# Patient Record
Sex: Female | Born: 1990 | Race: Black or African American | Hispanic: No | Marital: Married | Smoking: Never smoker
Health system: Southern US, Community
[De-identification: ages and names within clinical notes are randomized; demographics above are authoritative.]

## PROBLEM LIST (undated history)

## (undated) DIAGNOSIS — Z6841 Body Mass Index (BMI) 40.0 and over, adult: Secondary | ICD-10-CM

## (undated) DIAGNOSIS — F419 Anxiety disorder, unspecified: Secondary | ICD-10-CM

## (undated) DIAGNOSIS — R519 Headache, unspecified: Secondary | ICD-10-CM

## (undated) DIAGNOSIS — K8020 Calculus of gallbladder without cholecystitis without obstruction: Secondary | ICD-10-CM

## (undated) DIAGNOSIS — K219 Gastro-esophageal reflux disease without esophagitis: Secondary | ICD-10-CM

## (undated) DIAGNOSIS — Z8619 Personal history of other infectious and parasitic diseases: Secondary | ICD-10-CM

## (undated) DIAGNOSIS — T7840XA Allergy, unspecified, initial encounter: Secondary | ICD-10-CM

## (undated) DIAGNOSIS — R51 Headache: Secondary | ICD-10-CM

## (undated) HISTORY — DX: Headache, unspecified: R51.9

## (undated) HISTORY — DX: Headache: R51

## (undated) HISTORY — DX: Calculus of gallbladder without cholecystitis without obstruction: K80.20

## (undated) HISTORY — PX: NO PAST SURGERIES: SHX2092

## (undated) HISTORY — DX: Gastro-esophageal reflux disease without esophagitis: K21.9

## (undated) HISTORY — DX: Anxiety disorder, unspecified: F41.9

## (undated) HISTORY — DX: Personal history of other infectious and parasitic diseases: Z86.19

## (undated) HISTORY — DX: Allergy, unspecified, initial encounter: T78.40XA

## (undated) HISTORY — DX: Body Mass Index (BMI) 40.0 and over, adult: Z684

---
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
## 2008-10-08 ENCOUNTER — Emergency Department: Payer: Self-pay | Admitting: Unknown Physician Specialty

## 2008-11-26 ENCOUNTER — Emergency Department: Payer: Self-pay | Admitting: Emergency Medicine

## 2009-10-13 ENCOUNTER — Emergency Department: Payer: Self-pay | Admitting: Emergency Medicine

## 2011-12-24 ENCOUNTER — Emergency Department: Payer: Self-pay | Admitting: Emergency Medicine

## 2012-12-25 ENCOUNTER — Emergency Department: Payer: Self-pay | Admitting: Emergency Medicine

## 2013-02-02 ENCOUNTER — Emergency Department: Payer: Self-pay | Admitting: Emergency Medicine

## 2013-02-20 ENCOUNTER — Emergency Department: Payer: Self-pay | Admitting: Emergency Medicine

## 2014-01-20 DIAGNOSIS — K219 Gastro-esophageal reflux disease without esophagitis: Secondary | ICD-10-CM | POA: Insufficient documentation

## 2014-01-20 HISTORY — DX: Gastro-esophageal reflux disease without esophagitis: K21.9

## 2014-02-28 DIAGNOSIS — Z8619 Personal history of other infectious and parasitic diseases: Secondary | ICD-10-CM | POA: Insufficient documentation

## 2015-03-29 ENCOUNTER — Emergency Department
Admission: EM | Admit: 2015-03-29 | Discharge: 2015-03-29 | Disposition: A | Discharge status: Home / Self Care | Payer: Self-pay | Attending: Emergency Medicine | Admitting: Emergency Medicine

## 2015-03-29 DIAGNOSIS — J029 Acute pharyngitis, unspecified: Secondary | ICD-10-CM | POA: Insufficient documentation

## 2015-03-29 LAB — POCT RAPID STREP A: Streptococcus, Group A Screen (Direct): NEGATIVE

## 2015-03-29 MED ORDER — ACETAMINOPHEN-CODEINE 120-12 MG/5ML PO SUSP: 10.0000 mL | Freq: Four times a day (QID) | ORAL | Status: AC | PRN

## 2015-03-29 MED ORDER — ACETAMINOPHEN-CODEINE #3 300-30 MG PO TABS: 1.0000 | ORAL_TABLET | ORAL | Status: DC | PRN

## 2015-03-29 NOTE — Discharge Instructions (Signed)

## 2015-03-29 NOTE — ED Notes (Signed)
Rapid strep negative.

## 2015-03-29 NOTE — ED Provider Notes (Signed)
[facility] Emergency Department Provider Note  ____________________________________________  Time seen: Approximately 10:13 AM  I have reviewed the triage vital signs and the nursing notes.   HISTORY  Chief Complaint Sore Throat    HPI Sherry Spence is a 24 y.o. female who presents to the emergency department for evaluation of sore throat. Pain started last night. 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 She denies other symptoms such as cough or fever. She does state that she has generalized body aches and swollen lymph nodes.   No past medical history on file.  There are no active problems to display for this patient.   No past surgical history on file.  Current Outpatient Rx  Name  Route  Sig  Dispense  Refill  . acetaminophen-codeine (TYLENOL #3) 300-30 MG tablet   Oral   Take 1-2 tablets by mouth every 4 (four) hours as needed for moderate pain.   12 tablet   0     Allergies Review of patient's allergies indicates not on file.  No family history on file.  Social History Social History  Substance Use Topics  . Smoking status: Not on file  . Smokeless tobacco: Not on file  . Alcohol Use: Not on file    Review of Systems Constitutional: Negative for fever. Eyes: No visual changes. ENT: Positive for sore throat; positive for difficulty swallowing--due to pain. Respiratory: Denies shortness of breath. Gastrointestinal: No abdominal pain.  No nausea, no vomiting.  No diarrhea.  Genitourinary: Negative for dysuria. Musculoskeletal: Positive for generalized body aches. Skin: Negative for rash. Neurological: Negative for headaches, focal weakness or numbness.  10-point ROS otherwise negative.  ____________________________________________   PHYSICAL EXAM:  VITAL SIGNS: ED Triage Vitals  Enc Vitals Group     BP 03/29/15 0959 114/75 mmHg     Pulse Rate 03/29/15 0959 107     Resp 03/29/15 0959  15     Temp 03/29/15 0959 98.6 F (37 C)     Temp Source 03/29/15 0959 Oral     SpO2 03/29/15 0959 100 %     Weight 03/29/15 0959 230 lb (104.327 kg)     Height 03/29/15 0959 5\' 7"  (1.702 m)     Head Cir --      Peak Flow --      Pain Score --      Pain Loc --      Pain Edu? --      Excl. in [place]? --     Constitutional: Alert and oriented. Well appearing and in no acute distress. Eyes: Conjunctivae are normal. PERRL. EOMI. Head: Atraumatic. Nose: No congestion/rhinnorhea. Mouth/Throat: Mucous membranes are moist.  Oropharynx erythematous, without exudate, uvula and tonsils mildly swollen--no uvular shift. Neck: No stridor.  Lymphatic: Lymphadenopathy: Anterior cervical lymphadenopathy noted. Cardiovascular: Normal rate, regular rhythm. Good peripheral circulation. Respiratory: Normal respiratory effort. Lungs CTAB. Gastrointestinal: Soft and nontender. Musculoskeletal: No lower extremity tenderness nor edema.   Neurologic:  Normal speech and language. No gross focal neurologic deficits are appreciated. Speech is normal. No gait instability. Skin:  Skin is warm, dry and intact. No rash noted Psychiatric: Mood and affect are normal. Speech and behavior are normal.  ____________________________________________   LABS (all labs ordered are listed, but only abnormal results are displayed)  Labs Reviewed  POCT RAPID STREP A   ____________________________________________  EKG   ____________________________________________  RADIOLOGY  Not indicated ____________________________________________   PROCEDURES  Procedure(s) performed: None  Critical Care performed: No  ____________________________________________   INITIAL IMPRESSION / ASSESSMENT AND PLAN / ED COURSE  Pertinent labs & imaging results that were available during my care of the patient were reviewed by me and considered in my medical decision making (see chart for details).  Patient was advised to  follow up with the primary care provider for symptoms that are not improving over the next 2-3 days. She was advised to return to the emergency department for symptoms that change or worsen if unable to schedule an appointment with the primary care provider or specialist. ____________________________________________   FINAL CLINICAL IMPRESSION(S) / ED DIAGNOSES  Final diagnoses:  Viral pharyngitis      Victorino Dike, FNP 03/29/15 1056  Delman Kitten, MD 03/29/15 1526

## 2015-03-29 NOTE — ED Notes (Signed)
Pt began to have sore throat yesterday and awakened during the night with swollen throat and lymph nodes. She also c/o body aches. Pt alert & oriented. NAD noted.

## 2015-03-29 NOTE — ED Notes (Signed)
Pt discharged home after verbalizing understanding of discharge instructions; nad noted. 

## 2015-03-30 ENCOUNTER — Encounter: Payer: Self-pay | Admitting: Emergency Medicine

## 2015-03-30 ENCOUNTER — Emergency Department
Admission: EM | Admit: 2015-03-30 | Discharge: 2015-03-30 | Disposition: A | Discharge status: Home / Self Care | Payer: Self-pay | Attending: Student | Admitting: Student

## 2015-03-30 DIAGNOSIS — J020 Streptococcal pharyngitis: Secondary | ICD-10-CM | POA: Insufficient documentation

## 2015-03-30 MED ORDER — MAGIC MOUTHWASH W/LIDOCAINE: 5.0000 mL | Freq: Four times a day (QID) | ORAL | Status: DC

## 2015-03-30 MED ORDER — AMOXICILLIN 400 MG/5ML PO SUSR: 875.0000 mg | Freq: Two times a day (BID) | ORAL | Status: DC

## 2015-03-30 MED ORDER — AMOXICILLIN 875 MG PO TABS: 875.0000 mg | ORAL_TABLET | Freq: Two times a day (BID) | ORAL | Status: DC

## 2015-03-30 NOTE — Discharge Instructions (Signed)

## 2015-03-30 NOTE — ED Provider Notes (Signed)
[facility] Emergency Department Provider Note  ____________________________________________  Time seen: Approximately 6:32 PM  I have reviewed the triage vital signs and the nursing notes.   HISTORY  Chief Complaint Sore Throat    HPI Sherry Spence is a 24 y.o. female who returns to the emergency department for complaint of sore throat. She was seen yesterday and then emergent department and diagnosed with pharyngitis. She returns today due to her worsening symptoms. She states the pain is sharp in her throat. She endorses pain with swallowing. She endorses a tactile fever. She states she is experiencing a sporadic mild cough.   History reviewed. No pertinent past medical history.  There are no active problems to display for this patient.   History reviewed. No pertinent past surgical history.  Current Outpatient Rx  Name  Route  Sig  Dispense  Refill  . acetaminophen-codeine 120-12 MG/5ML suspension   Oral   Take 10 mLs by mouth every 6 (six) hours as needed for pain.   120 mL   0   . amoxicillin (AMOXIL) 875 MG tablet   Oral   Take 1 tablet (875 mg total) by mouth 2 (two) times daily.   14 tablet   0   . magic mouthwash w/lidocaine SOLN   Oral   Take 5 mLs by mouth 4 (four) times daily.   240 mL   0     Dispense in a 1/1/1/1 ratio     Allergies Review of patient's allergies indicates no known allergies.  No family history on file.  Social History Social History  Substance Use Topics  . Smoking status: Never Smoker   . Smokeless tobacco: None  . Alcohol Use: No    Review of Systems Constitutional: No fever/chills Eyes: No visual changes. ENT: Endorses sore throat. Cardiovascular: Denies chest pain. Respiratory: Denies shortness of breath. Gastrointestinal: No abdominal pain.  No nausea, no vomiting.  No diarrhea.  No constipation. Genitourinary: Negative for dysuria. Musculoskeletal: Negative for back pain. Skin:  Negative for rash. Neurological: Negative for headaches, focal weakness or numbness.  10-point ROS otherwise negative.  ____________________________________________   PHYSICAL EXAM:  VITAL SIGNS: ED Triage Vitals  Enc Vitals Group     BP 03/30/15 1831 120/74 mmHg     Pulse Rate 03/30/15 1831 100     Resp 03/30/15 1831 20     Temp 03/30/15 1831 99.7 F (37.6 C)     Temp Source 03/30/15 1831 Oral     SpO2 03/30/15 1831 98 %     Weight 03/30/15 1831 230 lb (104.327 kg)     Height 03/30/15 1831 5\' 7"  (1.702 m)     Head Cir --      Peak Flow --      Pain Score 03/30/15 1832 10     Pain Loc --      Pain Edu? --      Excl. in [place]? --     Constitutional: Alert and oriented. Well appearing and in no acute distress. Eyes: Conjunctivae are normal. PERRL. EOMI. Head: Atraumatic. Nose: No congestion/rhinnorhea. Mouth/Throat: Mucous membranes are moist.  Oropharynx erythematous. Positive white exudates on tonsils. Tonsils are edematous and erythematous. Uvula is midline. Neck: No stridor.   Hematological/Lymphatic/Immunilogical: Diffuse, mobile, tender anterior cervical lymphadenopathy. Cardiovascular: Normal rate, regular rhythm. Grossly normal heart sounds.  Good peripheral circulation. Respiratory: Normal respiratory effort.  No retractions. Lungs CTAB. Gastrointestinal: Soft and nontender. No distention. No abdominal bruits. No CVA tenderness. Musculoskeletal: No  lower extremity tenderness nor edema.  No joint effusions. Neurologic:  Normal speech and language. No gross focal neurologic deficits are appreciated. No gait instability. Skin:  Skin is warm, dry and intact. No rash noted. Psychiatric: Mood and affect are normal. Speech and behavior are normal.  ____________________________________________   LABS (all labs ordered are listed, but only abnormal results are displayed)  Labs Reviewed - No data to  display ____________________________________________  EKG   ____________________________________________  RADIOLOGY   ____________________________________________   PROCEDURES  Procedure(s) performed: None  Critical Care performed: No  ____________________________________________   INITIAL IMPRESSION / ASSESSMENT AND PLAN / ED COURSE  Pertinent labs & imaging results that were available during my care of the patient were reviewed by me and considered in my medical decision making (see chart for details).  The patient is a 24 year old female who presents to the emergency department for the second day in a row complaining of sore throat. She states that she has been exposed to multiple sick contacts. She was diagnosed yesterday with viral pharyngitis. She states the pain has worsened today.Patient had a negative strep test yesterday. The patient does meet Cent[facility] criteria today for streptococcal pharyngitis with fever despite taking Tylenol, tender lymphadenopathy, absence of a cough, and exudates on tonsils. Due to the patient now meaning 4 out of 5 Centor criteria I will treat the patient for strep. I will not re-swallow the patient for strep or culture as it would not change my current treatment plan. I advised patient of findings and diagnosis and she verbalizes understanding of same. The patient will be placed on antibiotics and magic mouthwash for symptom control. The patient is to follow-up with her primary care for any continuation of symptoms.   ____________________________________________   FINAL CLINICAL IMPRESSION(S) / ED DIAGNOSES  Final diagnoses:  Strep throat      Darletta Moll, PA-C 03/30/15 1854  Joanne Gavel, MD 03/30/15 2232

## 2015-03-30 NOTE — ED Notes (Signed)
Pt refused prescription for amoxicillin pills, script was changed to liquid and then pt refused liquid and wanted a shot. Explained to patient that a shot was not a route the provider wanted to administer. Pt stated " Then you will get me the pills." Explained that the original script was destroyed and pt laughed and stated " You will get me what I want." This RN exited the room and notified the provider. The provider is now in room with patient.

## 2015-03-30 NOTE — ED Notes (Signed)
Sore throat since sat  Was seen yesterday for same. States worse toda

## 2015-11-30 ENCOUNTER — Emergency Department
Admission: EM | Admit: 2015-11-30 | Discharge: 2015-11-30 | Disposition: A | Discharge status: Home / Self Care | Payer: Self-pay | Attending: Emergency Medicine | Admitting: Emergency Medicine

## 2015-11-30 DIAGNOSIS — O26891 Other specified pregnancy related conditions, first trimester: Secondary | ICD-10-CM | POA: Insufficient documentation

## 2015-11-30 DIAGNOSIS — Y9301 Activity, walking, marching and hiking: Secondary | ICD-10-CM | POA: Insufficient documentation

## 2015-11-30 DIAGNOSIS — W19XXXA Unspecified fall, initial encounter: Secondary | ICD-10-CM

## 2015-11-30 DIAGNOSIS — Z792 Long term (current) use of antibiotics: Secondary | ICD-10-CM | POA: Insufficient documentation

## 2015-11-30 DIAGNOSIS — W108XXA Fall (on) (from) other stairs and steps, initial encounter: Secondary | ICD-10-CM | POA: Insufficient documentation

## 2015-11-30 DIAGNOSIS — Z3A11 11 weeks gestation of pregnancy: Secondary | ICD-10-CM | POA: Insufficient documentation

## 2015-11-30 DIAGNOSIS — T148 Other injury of unspecified body region: Secondary | ICD-10-CM | POA: Insufficient documentation

## 2015-11-30 DIAGNOSIS — Y929 Unspecified place or not applicable: Secondary | ICD-10-CM | POA: Insufficient documentation

## 2015-11-30 DIAGNOSIS — T148XXA Other injury of unspecified body region, initial encounter: Secondary | ICD-10-CM

## 2015-11-30 DIAGNOSIS — Y999 Unspecified external cause status: Secondary | ICD-10-CM | POA: Insufficient documentation

## 2015-11-30 NOTE — ED Provider Notes (Signed)
[facility] Emergency Department Provider Note  Time seen: 5:28 PM  I have reviewed the triage vital signs and the nursing notes.   HISTORY  Chief Complaint Fall    HPI Sherry Spence is a 25 y.o. female with no past medical history who is approximately [redacted] weeks pregnant who presents the emergency department after a fall. According to the patient she was walking down steps when she slipped falling onto her buttocks and sliding down 5-6 steps.Patient states mild buttocks pain and lower back pain. States being [redacted] weeks pregnant and she is concerned about her baby, denies vaginal bleeding, discharge and denies hitting her abdomen.      History reviewed. No pertinent past medical history.  There are no active problems to display for this patient.   History reviewed. No pertinent past surgical history.  Current Outpatient Rx  Name  Route  Sig  Dispense  Refill  . acetaminophen-codeine 120-12 MG/5ML suspension   Oral   Take 10 mLs by mouth every 6 (six) hours as needed for pain.   120 mL   0   . amoxicillin (AMOXIL) 400 MG/5ML suspension   Oral   Take 10.9 mLs (875 mg total) by mouth 2 (two) times daily.   218 mL   0   . magic mouthwash w/lidocaine SOLN   Oral   Take 5 mLs by mouth 4 (four) times daily.   240 mL   0     Dispense in a 1/1/1/1 ratio     Allergies Compazine  No family history on file.  Social History Social History  Substance Use Topics  . Smoking status: Never Smoker   . Smokeless tobacco: None  . Alcohol Use: No    Review of Systems Constitutional: Negative for fever. Cardiovascular: Negative for chest pain. Respiratory: Negative for shortness of breath. Gastrointestinal: Negative for abdominal pain Musculoskeletal:  mild buttock pain, mild lower back pain. skin: Negative for rash. Neurological: Negative for headache 10-point ROS otherwise  negative.  ____________________________________________   PHYSICAL EXAM:  VITAL SIGNS: ED Triage Vitals  Enc Vitals Group     BP 11/30/15 1607 125/68 mmHg     Pulse Rate 11/30/15 1607 76     Resp 11/30/15 1607 16     Temp 11/30/15 1607 97.7 F (36.5 C)     Temp Source 11/30/15 1607 Oral     SpO2 11/30/15 1607 98 %     Weight 11/30/15 1607 250 lb (113.399 kg)     Height 11/30/15 1607 5\' 7"  (1.702 m)     Head Cir --      Peak Flow --      Pain Score 11/30/15 1607 8     Pain Loc --      Pain Edu? --      Excl. in [place]? --     Constitutional: Alert and oriented. Well appearing and in no distress. Eyes: Normal exam ENT   Head: Normocephalic and atraumatic.   Mouth/Throat: Mucous membranes are moist. Cardiovascular: Normal rate, regular rhythm. No murmur Respiratory: Normal respiratory effort without tachypnea nor retractions. Breath sounds are clear  Gastrointestinal: Soft and nontender. No distention.   Musculoskeletal: Nontender with normal range of motion in all extremities. mild lumbar spine tenderness palpation, mild right buttock tenderness to palpation. No deformity or ecchymosis. Neurologic:  Normal speech and language. No gross focal neurologic deficits Skin:  Skin is warm, dry and intact.  Psychiatric: Mood and affect are normal.   ____________________________________________  INITIAL IMPRESSION / ASSESSMENT AND PLAN / ED COURSE  Pertinent labs & imaging results that were available during my care of the patient were reviewed by me and considered in my medical decision making (see chart for details).   THE PATIENT PRESENTS EMERGENCY DEPARTMENT AFTER A FALL DOWN STEPS. OVERALL THE PATIENT APPEARS VERY WELL, MILD LUMBAR TENDERNESS TO PALPATION. PATIENT STATES HER MAIN CONCERN WAS MAKING SURE HER BABY IS OKAY. BEDSIDE ULTRASOUND PERFORMED BY MYSELF SHOWS A FETAL HEART RATE OF 158 BPM. VERY NORMAL FETAL MOVEMENT IS A NORMAL AMOUNT OF AMNIOTIC FLUID. PATIENT IS  REASSURED AFTER SEEING THE ULTRASOUND. I DISCUSSED TYLENOL AS NEEDED FOR PAIN MANAGEMENT AN OB/GYN FOLLOW-UP. PATIENT AGREEABLE TO THIS PLAN. ____________________________________________   FINAL CLINICAL IMPRESSION(S) / ED DIAGNOSES   fall  Harvest Dark, MD 11/30/15 1732
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
## 2015-11-30 NOTE — ED Notes (Signed)
Pt reports being [redacted] weeks pregnant, but states that she did not fall onto stomach, just on her side.

## 2015-11-30 NOTE — Discharge Instructions (Signed)
What Do I Need to Know About Injuries During Pregnancy? Injuries can happen during pregnancy. Minor falls and accidents usually do not harm you or your baby. However, any injury should be reported to your doctor. WHAT CAN I DO TO PROTECT MYSELF FROM INJURIES?  Remove rugs and loose objects on the floor.  Wear comfortable shoes that have a good grip. Do not wear high-heeled shoes.  Always wear your seat belt. The lap belt should be below your belly. Always practice safe driving.  Do not ride on a motorcycle.  Do not participate in high-impact activities or sports.  Avoid:  Walking on wet or slippery floors.  Fires.  Starting fires.  Lifting heavy pots of boiling or hot liquids.  Fixing electrical problems.  Only take medicine as told by your doctor.  Know your blood type and the blood type of the baby's father.  Call your local emergency services (911 in the [place] if you are a victim of domestic violence or assault. For help and support, contact the [employer]. WHEN SHOULD I GET HELP RIGHT AWAY?  You fall on your belly or have any high-impact accident or injury.  You have been a victim of domestic violence or any kind of violence.  You have been in a car accident.  You have bleeding from your vagina.  Fluid is leaking from your vagina.  You start to have belly cramping (contractions) or pain.  You feel weak or pass out (faint).  You start to throw up (vomit) after an injury.  You have been burned.  You have a stiff neck or neck pain.  You get a headache or have vision problems after an injury.  You do not feel the baby move or the baby is not moving as much as normal.   This information is not intended to replace advice given to you by your health care provider. Make sure you discuss any questions you have with your health care provider.   Document Released: 06/11/2010 Document Revised: 05/30/2014 Document Reviewed:  02/13/2013 Elsevier Interactive Patient Education [employer].

## 2015-11-30 NOTE — ED Notes (Signed)
Pt states she slipped and fell on her buttock down 5-6 steps today, pt states she is [redacted]weeks pregnant, denies any abd pain or bleeding, just wants to be checked out.Marland Kitchen

## 2015-12-28 ENCOUNTER — Emergency Department: Payer: No Typology Code available for payment source

## 2015-12-28 ENCOUNTER — Emergency Department
Admission: EM | Admit: 2015-12-28 | Discharge: 2015-12-28 | Disposition: A | Discharge status: Home / Self Care | Payer: No Typology Code available for payment source | Attending: Emergency Medicine | Admitting: Emergency Medicine

## 2015-12-28 ENCOUNTER — Encounter: Payer: Self-pay | Admitting: Emergency Medicine

## 2015-12-28 DIAGNOSIS — Y999 Unspecified external cause status: Secondary | ICD-10-CM | POA: Diagnosis not present

## 2015-12-28 DIAGNOSIS — Z3A16 16 weeks gestation of pregnancy: Secondary | ICD-10-CM | POA: Insufficient documentation

## 2015-12-28 DIAGNOSIS — R1032 Left lower quadrant pain: Secondary | ICD-10-CM | POA: Diagnosis present

## 2015-12-28 DIAGNOSIS — O26899 Other specified pregnancy related conditions, unspecified trimester: Secondary | ICD-10-CM

## 2015-12-28 DIAGNOSIS — Y92410 Unspecified street and highway as the place of occurrence of the external cause: Secondary | ICD-10-CM | POA: Diagnosis not present

## 2015-12-28 DIAGNOSIS — O26892 Other specified pregnancy related conditions, second trimester: Secondary | ICD-10-CM | POA: Insufficient documentation

## 2015-12-28 DIAGNOSIS — R109 Unspecified abdominal pain: Secondary | ICD-10-CM

## 2015-12-28 DIAGNOSIS — Y9389 Activity, other specified: Secondary | ICD-10-CM | POA: Diagnosis not present

## 2015-12-28 DIAGNOSIS — K8020 Calculus of gallbladder without cholecystitis without obstruction: Secondary | ICD-10-CM | POA: Insufficient documentation

## 2015-12-28 LAB — OB RESULTS CONSOLE RPR: RPR: NONREACTIVE

## 2015-12-28 LAB — CBC WITH DIFFERENTIAL/PLATELET
Basophils Absolute: 0.1 10*3/uL (ref 0–0.1)
Basophils Relative: 1 %
Eosinophils Absolute: 0.2 10*3/uL (ref 0–0.7)
Eosinophils Relative: 3 %
HCT: 33.9 % — ABNORMAL LOW (ref 35.0–47.0)
Hemoglobin: 11.7 g/dL — ABNORMAL LOW (ref 12.0–16.0)
Lymphocytes Relative: 19 %
Lymphs Abs: 1.4 10*3/uL (ref 1.0–3.6)
MCH: 27.2 pg (ref 26.0–34.0)
MCHC: 34.4 g/dL (ref 32.0–36.0)
MCV: 79 fL — ABNORMAL LOW (ref 80.0–100.0)
Monocytes Absolute: 0.5 10*3/uL (ref 0.2–0.9)
Monocytes Relative: 7 %
Neutro Abs: 5.2 10*3/uL (ref 1.4–6.5)
Neutrophils Relative %: 70 %
Platelets: 319 10*3/uL (ref 150–440)
RBC: 4.29 MIL/uL (ref 3.80–5.20)
RDW: 14.6 % — ABNORMAL HIGH (ref 11.5–14.5)
WBC: 7.5 10*3/uL (ref 3.6–11.0)

## 2015-12-28 LAB — LIPASE, BLOOD: Lipase: 24 U/L (ref 11–51)

## 2015-12-28 LAB — OB RESULTS CONSOLE ABO/RH: RH Type: POSITIVE

## 2015-12-28 LAB — COMPREHENSIVE METABOLIC PANEL
ALT: 34 U/L (ref 14–54)
AST: 21 U/L (ref 15–41)
Albumin: 2.8 g/dL — ABNORMAL LOW (ref 3.5–5.0)
Alkaline Phosphatase: 114 U/L (ref 38–126)
Anion gap: 5 (ref 5–15)
BUN: 8 mg/dL (ref 6–20)
CO2: 22 mmol/L (ref 22–32)
Calcium: 8.2 mg/dL — ABNORMAL LOW (ref 8.9–10.3)
Chloride: 108 mmol/L (ref 101–111)
Creatinine, Ser: 0.39 mg/dL — ABNORMAL LOW (ref 0.44–1.00)
GFR calc Af Amer: >60 mL/min (ref >60)
GFR calc non Af Amer: >60 mL/min (ref >60)
Glucose, Bld: 100 mg/dL — ABNORMAL HIGH (ref 65–99)
Potassium: 3.5 mmol/L (ref 3.5–5.1)
Sodium: 135 mmol/L (ref 135–145)
Total Bilirubin: 0.4 mg/dL (ref 0.3–1.2)
Total Protein: 6.7 g/dL (ref 6.5–8.1)

## 2015-12-28 LAB — ABO/RH: ABO/RH(D): O POS

## 2015-12-28 LAB — OB RESULTS CONSOLE GC/CHLAMYDIA
Chlamydia: NEGATIVE
Gonorrhea: NEGATIVE

## 2015-12-28 LAB — OB RESULTS CONSOLE HGB/HCT, BLOOD
HCT: 34 %
Hemoglobin: 11.7 g/dL

## 2015-12-28 LAB — OB RESULTS CONSOLE PLATELET COUNT: Platelets: 319 10*3/uL

## 2015-12-28 LAB — OB RESULTS CONSOLE ANTIBODY SCREEN: Antibody Screen: NEGATIVE

## 2015-12-28 LAB — OB RESULTS CONSOLE HIV ANTIBODY (ROUTINE TESTING): HIV: NONREACTIVE

## 2015-12-28 LAB — OB RESULTS CONSOLE HEPATITIS B SURFACE ANTIGEN: Hepatitis B Surface Ag: NEGATIVE

## 2015-12-28 NOTE — ED Notes (Signed)
Pt taken to US

## 2015-12-28 NOTE — ED Notes (Signed)
Two attempted IVs by Ramond Dial.  Katie RN attempting third.
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
## 2016-01-07 ENCOUNTER — Emergency Department
Admission: EM | Admit: 2016-01-07 | Discharge: 2016-01-07 | Disposition: A | Discharge status: Home / Self Care | Payer: No Typology Code available for payment source | Attending: Emergency Medicine | Admitting: Emergency Medicine

## 2016-01-07 ENCOUNTER — Emergency Department: Payer: No Typology Code available for payment source

## 2016-01-07 DIAGNOSIS — R102 Pelvic and perineal pain unspecified side: Secondary | ICD-10-CM

## 2016-01-07 DIAGNOSIS — Y92410 Unspecified street and highway as the place of occurrence of the external cause: Secondary | ICD-10-CM | POA: Insufficient documentation

## 2016-01-07 DIAGNOSIS — Y939 Activity, unspecified: Secondary | ICD-10-CM | POA: Insufficient documentation

## 2016-01-07 DIAGNOSIS — R109 Unspecified abdominal pain: Secondary | ICD-10-CM

## 2016-01-07 DIAGNOSIS — O26892 Other specified pregnancy related conditions, second trimester: Secondary | ICD-10-CM | POA: Insufficient documentation

## 2016-01-07 DIAGNOSIS — R1032 Left lower quadrant pain: Secondary | ICD-10-CM

## 2016-01-07 DIAGNOSIS — Y999 Unspecified external cause status: Secondary | ICD-10-CM | POA: Diagnosis not present

## 2016-01-07 DIAGNOSIS — O26899 Other specified pregnancy related conditions, unspecified trimester: Secondary | ICD-10-CM

## 2016-01-07 DIAGNOSIS — Z3A16 16 weeks gestation of pregnancy: Secondary | ICD-10-CM | POA: Insufficient documentation

## 2016-01-07 LAB — URINALYSIS COMPLETE WITH MICROSCOPIC (ARMC ONLY)
Bacteria, UA: NONE SEEN
Bilirubin Urine: NEGATIVE
Glucose, UA: NEGATIVE mg/dL
Hgb urine dipstick: NEGATIVE
Ketones, ur: NEGATIVE mg/dL
Leukocytes, UA: NEGATIVE
Nitrite: NEGATIVE
Protein, ur: NEGATIVE mg/dL
Specific Gravity, Urine: 1.017 (ref 1.005–1.030)
pH: 6 (ref 5.0–8.0)

## 2016-01-07 LAB — COMPREHENSIVE METABOLIC PANEL
ALT: 33 U/L (ref 14–54)
AST: 21 U/L (ref 15–41)
Albumin: 3.1 g/dL — ABNORMAL LOW (ref 3.5–5.0)
Alkaline Phosphatase: 117 U/L (ref 38–126)
Anion gap: 7 (ref 5–15)
BUN: 6 mg/dL (ref 6–20)
CO2: 21 mmol/L — ABNORMAL LOW (ref 22–32)
Calcium: 8.6 mg/dL — ABNORMAL LOW (ref 8.9–10.3)
Chloride: 107 mmol/L (ref 101–111)
Creatinine, Ser: 0.45 mg/dL (ref 0.44–1.00)
GFR calc Af Amer: >60 mL/min (ref >60)
GFR calc non Af Amer: >60 mL/min (ref >60)
Glucose, Bld: 112 mg/dL — ABNORMAL HIGH (ref 65–99)
Potassium: 3.3 mmol/L — ABNORMAL LOW (ref 3.5–5.1)
Sodium: 135 mmol/L (ref 135–145)
Total Bilirubin: 0.5 mg/dL (ref 0.3–1.2)
Total Protein: 7.3 g/dL (ref 6.5–8.1)

## 2016-01-07 LAB — CBC
HCT: 35.4 % (ref 35.0–47.0)
Hemoglobin: 12.2 g/dL (ref 12.0–16.0)
MCH: 27.5 pg (ref 26.0–34.0)
MCHC: 34.4 g/dL (ref 32.0–36.0)
MCV: 79.7 fL — ABNORMAL LOW (ref 80.0–100.0)
Platelets: 327 10*3/uL (ref 150–440)
RBC: 4.44 MIL/uL (ref 3.80–5.20)
RDW: 14.7 % — ABNORMAL HIGH (ref 11.5–14.5)
WBC: 8.5 10*3/uL (ref 3.6–11.0)

## 2016-01-07 LAB — HCG, QUANTITATIVE, PREGNANCY: hCG, Beta Chain, Quant, S: 37005 m[IU]/mL — ABNORMAL HIGH (ref <5)

## 2016-01-07 LAB — CHLAMYDIA/NGC RT PCR (ARMC ONLY)
Chlamydia Tr: NOT DETECTED
N gonorrhoeae: NOT DETECTED

## 2016-01-07 LAB — LIPASE, BLOOD: Lipase: 28 U/L (ref 11–51)

## 2016-01-07 LAB — WET PREP, GENITAL
Clue Cells Wet Prep HPF POC: NONE SEEN
Sperm: NONE SEEN
Trich, Wet Prep: NONE SEEN
Yeast Wet Prep HPF POC: NONE SEEN
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
## 2016-03-18 ENCOUNTER — Encounter: Payer: Self-pay | Admitting: Obstetrics and Gynecology

## 2016-03-18 ENCOUNTER — Ambulatory Visit (INDEPENDENT_AMBULATORY_CARE_PROVIDER_SITE_OTHER): Payer: Medicaid Other | Admitting: Obstetrics and Gynecology

## 2016-03-18 VITALS — BP 113/78 | HR 91 | Wt 246.0 lb

## 2016-03-18 DIAGNOSIS — Z6841 Body Mass Index (BMI) 40.0 and over, adult: Secondary | ICD-10-CM | POA: Insufficient documentation

## 2016-03-18 DIAGNOSIS — O0932 Supervision of pregnancy with insufficient antenatal care, second trimester: Secondary | ICD-10-CM

## 2016-03-18 DIAGNOSIS — E669 Obesity, unspecified: Secondary | ICD-10-CM

## 2016-03-18 DIAGNOSIS — O219 Vomiting of pregnancy, unspecified: Secondary | ICD-10-CM

## 2016-03-18 DIAGNOSIS — O99212 Obesity complicating pregnancy, second trimester: Secondary | ICD-10-CM

## 2016-03-18 DIAGNOSIS — O0930 Supervision of pregnancy with insufficient antenatal care, unspecified trimester: Secondary | ICD-10-CM | POA: Insufficient documentation

## 2016-03-18 DIAGNOSIS — Z3490 Encounter for supervision of normal pregnancy, unspecified, unspecified trimester: Secondary | ICD-10-CM | POA: Insufficient documentation

## 2016-03-18 DIAGNOSIS — O2340 Unspecified infection of urinary tract in pregnancy, unspecified trimester: Secondary | ICD-10-CM | POA: Insufficient documentation

## 2016-03-18 DIAGNOSIS — O2342 Unspecified infection of urinary tract in pregnancy, second trimester: Secondary | ICD-10-CM

## 2016-03-18 DIAGNOSIS — O99210 Obesity complicating pregnancy, unspecified trimester: Secondary | ICD-10-CM | POA: Insufficient documentation

## 2016-03-18 DIAGNOSIS — Z23 Encounter for immunization: Secondary | ICD-10-CM | POA: Diagnosis not present

## 2016-03-18 DIAGNOSIS — Z3492 Encounter for supervision of normal pregnancy, unspecified, second trimester: Secondary | ICD-10-CM

## 2016-03-18 HISTORY — DX: Body Mass Index (BMI) 40.0 and over, adult: Z684

## 2016-03-18 MED ORDER — DOXYLAMINE-PYRIDOXINE 10-10 MG PO TBEC: DELAYED_RELEASE_TABLET | ORAL | 1 refills | Status: DC

## 2016-03-18 NOTE — Progress Notes (Signed)
New OB Note  03/18/2016   Clinic: [facility]  Chief Complaint: establish care  Transfer of Care Patient: Yes, [facility]  History of Present Illness: Sherry Spence is a 25 y.o. G2P1000 @ 26/3 weeks ([place] 1/30, based on Patient's last menstrual period was 09/15/2015.=15wk u/s), with the above CC. Preg complicated by has Supervision of normal pregnancy in second trimester; BMI 40.0-44.9, adult ([place]); Obesity in pregnancy; UTI in pregnancy; Nausea and vomiting during pregnancy; and Late prenatal care on her problem list.   Patient states she'd like to transfer care b/c she thinks she'll get better care with [place] vs [facility].  ROS: A 12-point review of systems was performed and negative, except as stated in the above HPI.  OBGYN History: As per HPI. OB History  Gravida Para Term Preterm AB Living  2 1 1         SAB TAB Ectopic Multiple Live Births          1    # Outcome Date GA Lbr Len/2nd Weight Sex Delivery Anes PTL Lv  2 Current           1 Term 03/05/14 [redacted]w[redacted]d  7 lb 5 oz (3.317 kg) F Vag-Spont  N      Complications: Meconium aspiration     Birth Comments: Baby- had surgery right after delivery "cyst blocking airway"       Past Medical History: Past Medical History:  Diagnosis Date  . Cholelithiasis     Past Surgical History: Past Surgical History:  Procedure Laterality Date  . NO PAST SURGERIES      Family History:  Family History  Problem Relation Age of Onset  . Hypertension Father   . Diabetes Paternal Aunt   . Cancer Maternal Grandmother   . Diabetes Maternal Grandmother   . Diabetes Paternal Grandmother   . Hypertension Paternal Grandmother   . Mental retardation Neg Hx     Social History:  Social History   Social History  . Marital status: Single    Spouse name: N/A  . Number of children: N/A  . Years of education: N/A   Occupational History  . Not on file.   Social History Main Topics  . Smoking status: Never Smoker  . Smokeless  tobacco: Never Used  . Alcohol use No  . Drug use: No  . Sexual activity: Yes    Birth control/ protection: None   Other Topics Concern  . Not on file   Social History Narrative  . No narrative on file    Allergy: Allergies  Allergen Reactions  . Compazine [Prochlorperazine Edisylate] Anxiety    Health Maintenance:  Mammogram Up to Date: not applicable  Current Outpatient Medications: PNV amox  Physical Exam:   BP 113/78   Pulse 91   Wt 246 lb (111.6 kg)   LMP 09/15/2015   BMI 39.71 kg/m  Body mass index is 39.71 kg/m. Fundal height: 28 FHTs: 150s  General appearance: Well nourished, well developed female in no acute distress.  Abdomen: gravid, obese, nttp  Laboratory: O pos/Rub unknown/rpr neg/hiv neg/hepB neg/tdap needed/hemoglobinopathy neg/TSH neg/A1c 5.2/cmp neg/GC-CT neg/UDS neg/PC ratio 144/quad screen at [facility] and results unknown/early 1hr in the 100s per patient  Imaging:  8/7 and 8/17 u/s at [facility] anatomy u/s negative  Assessment: pt doing well  Plan: 1. Encounter for supervision of normal pregnancy in second trimester, unspecified gravidity Routine care. D/w pt that we only deliver at [facility] and she states that she  is aware of that. tdap 28-30wks.  - RPR - CBC - HIV antibody (with reflex) - Glucose Tolerance, 1 HR (50g) w/o Fasting  2. BMI 40.0-44.9, adult ([facility]) Negative baseline pre-x labs  3. Obesity in pregnancy See above  4. Urinary tract infection in mother during second trimester of pregnancy TOC one month  5. Nausea of pregnancy Pt states previously on zofran but s/s persisted. She does have a h/o gallstones but no current infection s/s. Has never been on diclegis so samples given and medicaid PA done.   Problem list reviewed and updated.  Follow up in 2 weeks.  >50% of 25 min visit spent on counseling and coordination of care.     Sherry Romans MD Attending [facility] for [facility])

## 2016-03-18 NOTE — Addendum Note (Signed)
Addended by: Phill Myron on: 03/18/2016 10:35 AM   Modules accepted: Orders

## 2016-03-21 ENCOUNTER — Encounter: Payer: Self-pay | Admitting: *Deleted

## 2016-03-21 LAB — GLUCOSE, 1 HOUR GESTATIONAL: Glucose 1 Hr Prenatal, POC: 107 mg/dL

## 2016-03-21 MED ORDER — DOXYLAMINE-PYRIDOXINE 10-10 MG PO TBEC: DELAYED_RELEASE_TABLET | ORAL | 1 refills | Status: DC

## 2016-03-21 NOTE — Addendum Note (Signed)
Addended by: Ricka Burdock on: 03/21/2016 09:46 AM   Modules accepted: Orders

## 2016-03-22 LAB — RPR

## 2016-03-22 LAB — HIV ANTIBODY (ROUTINE TESTING W REFLEX): HIV 1&2 Ab, 4th Generation: NONREACTIVE

## 2016-03-23 LAB — CBC

## 2016-03-26 LAB — GLUCOSE TOLERANCE, 1 HOUR (50G) W/O FASTING: Glucose, 1 Hr, gestational: 116 mg/dL (ref <140)

## 2016-03-28 ENCOUNTER — Encounter: Payer: Self-pay | Admitting: *Deleted

## 2016-03-28 ENCOUNTER — Other Ambulatory Visit (INDEPENDENT_AMBULATORY_CARE_PROVIDER_SITE_OTHER): Payer: Medicaid Other | Admitting: *Deleted

## 2016-03-28 DIAGNOSIS — Z3492 Encounter for supervision of normal pregnancy, unspecified, second trimester: Secondary | ICD-10-CM | POA: Diagnosis not present

## 2016-03-29 LAB — GLUCOSE TOLERANCE, 1 HOUR (50G) W/O FASTING: Glucose, 1 Hr, gestational: 88 mg/dL (ref <140)

## 2016-03-29 LAB — CBC
HCT: 30.7 % — ABNORMAL LOW (ref 35.0–45.0)
Hemoglobin: 9.8 g/dL — ABNORMAL LOW (ref 11.7–15.5)
MCH: 26.5 pg — ABNORMAL LOW (ref 27.0–33.0)
MCHC: 31.9 g/dL — ABNORMAL LOW (ref 32.0–36.0)
MCV: 83 fL (ref 80.0–100.0)
MPV: 9.5 fL (ref 7.5–12.5)
Platelets: 440 10*3/uL — ABNORMAL HIGH (ref 140–400)
RBC: 3.7 MIL/uL — ABNORMAL LOW (ref 3.80–5.10)
RDW: 14.6 % (ref 11.0–15.0)
WBC: 7.9 10*3/uL (ref 3.8–10.8)

## 2016-04-05 ENCOUNTER — Encounter: Payer: Self-pay | Admitting: Family Medicine

## 2016-04-05 ENCOUNTER — Ambulatory Visit (INDEPENDENT_AMBULATORY_CARE_PROVIDER_SITE_OTHER): Payer: Medicaid Other | Admitting: Family Medicine

## 2016-04-05 ENCOUNTER — Encounter: Payer: Self-pay | Admitting: *Deleted

## 2016-04-05 VITALS — BP 120/80 | HR 98 | Wt 250.0 lb

## 2016-04-05 DIAGNOSIS — Z3483 Encounter for supervision of other normal pregnancy, third trimester: Secondary | ICD-10-CM

## 2016-04-05 DIAGNOSIS — Z3480 Encounter for supervision of other normal pregnancy, unspecified trimester: Secondary | ICD-10-CM

## 2016-04-05 DIAGNOSIS — O219 Vomiting of pregnancy, unspecified: Secondary | ICD-10-CM | POA: Diagnosis not present

## 2016-04-05 DIAGNOSIS — O2343 Unspecified infection of urinary tract in pregnancy, third trimester: Secondary | ICD-10-CM

## 2016-04-05 NOTE — Progress Notes (Signed)
   PRENATAL VISIT NOTE  Subjective:  Sherry Spence is a 25 y.o. G2P1001 at [redacted]w[redacted]d being seen today for ongoing prenatal care.  She is currently monitored for the following issues for this low-risk pregnancy and has Supervision of normal pregnancy, antepartum; BMI 40.0-44.9, adult ([place]); Obesity in pregnancy; UTI in pregnancy; Nausea and vomiting during pregnancy; and Late prenatal care on her problem list.  Patient reports no complaints.  Contractions: Irregular. Vag. Bleeding: None.  Movement: Present. Denies leaking of fluid.   The following portions of the patient's history were reviewed and updated as appropriate: allergies, current medications, past family history, past medical history, past social history, past surgical history and problem list. Problem list updated.  Objective:   Vitals:   04/05/16 1342  BP: 120/80  Pulse: 98  Weight: 250 lb (113.4 kg)    Fetal Status: Fetal Heart Rate (bpm): 136 Fundal Height: 31 cm Movement: Present     General:  Alert, oriented and cooperative. Patient is in no acute distress.  Skin: Skin is warm and dry. No rash noted.   Cardiovascular: Normal heart rate noted  Respiratory: Normal respiratory effort, no problems with respiration noted  Abdomen: Soft, gravid, appropriate for gestational age. Pain/Pressure: Present     Pelvic:  Cervical exam deferred        Extremities: Normal range of motion.  Edema: Trace  Mental Status: Normal mood and affect. Normal behavior. Normal judgment and thought content.   Assessment and Plan:  Pregnancy: G2P1001 at [redacted]w[redacted]d  1. Supervision of other normal pregnancy, antepartum Continue routine prenatal care.  2. Nausea and vomiting during pregnancy Diclegis made her too sleepy, so no Phenergan and she declines Zofran due to constipation. Advised ginger, keep something on her stomach, [place] [employer].  3. Urinary tract infection in mother during third trimester of pregnancy Repeat urine culture - Culture, OB  Urine  Preterm labor symptoms and general obstetric precautions including but not limited to vaginal bleeding, contractions, leaking of fluid and fetal movement were reviewed in detail with the patient. Please refer to After Visit Summary for other counseling recommendations.  Return in 2 weeks (on 04/19/2016).   Donnamae Jude, MD

## 2016-04-05 NOTE — Patient Instructions (Signed)
Third Trimester of Pregnancy The third trimester is from week 29 through week 40 (months 7 through 9). The third trimester is a time when the unborn baby (fetus) is growing rapidly. At the end of the ninth month, the fetus is about 20 inches in length and weighs 6-10 pounds. Body changes during your third trimester Your body goes through many changes during pregnancy. The changes vary from woman to woman. During the third trimester:  Your weight will continue to increase. You can expect to gain 25-35 pounds (11-16 kg) by the end of the pregnancy.  You may begin to get stretch marks on your hips, abdomen, and breasts.  You may urinate more often because the fetus is moving lower into your pelvis and pressing on your bladder.  You may develop or continue to have heartburn. This is caused by increased hormones that slow down muscles in the digestive tract.  You may develop or continue to have constipation because increased hormones slow digestion and cause the muscles that push waste through your intestines to relax.  You may develop hemorrhoids. These are swollen veins (varicose veins) in the rectum that can itch or be painful.  You may develop swollen, bulging veins (varicose veins) in your legs.  You may have increased body aches in the pelvis, back, or thighs. This is due to weight gain and increased hormones that are relaxing your joints.  You may have changes in your hair. These can include thickening of your hair, rapid growth, and changes in texture. Some women also have hair loss during or after pregnancy, or hair that feels dry or thin. Your hair will most likely return to normal after your baby is born.  Your breasts will continue to grow and they will continue to become tender. A yellow fluid (colostrum) may leak from your breasts. This is the first milk you are producing for your baby.  Your belly button may stick out.  You may notice more swelling in your hands, face, or  ankles.  You may have increased tingling or numbness in your hands, arms, and legs. The skin on your belly may also feel numb.  You may feel short of breath because of your expanding uterus.  You may have more problems sleeping. This can be caused by the size of your belly, increased need to urinate, and an increase in your body's metabolism.  You may notice the fetus "dropping," or moving lower in your abdomen.  You may have increased vaginal discharge.  Your cervix becomes thin and soft (effaced) near your due date. What to expect at prenatal visits You will have prenatal exams every 2 weeks until week 36. Then you will have weekly prenatal exams. During a routine prenatal visit:  You will be weighed to make sure you and the fetus are growing normally.  Your blood pressure will be taken.  Your abdomen will be measured to track your baby's growth.  The fetal heartbeat will be listened to.  Any test results from the previous visit will be discussed.  You may have a cervical check near your due date to see if you have effaced. At around 36 weeks, your health care provider will check your cervix. At the same time, your health care provider will also perform a test on the secretions of the vaginal tissue. This test is to determine if a type of bacteria, Group B streptococcus, is present. Your health care provider will explain this further. Your health care provider may ask you:    What your birth plan is.  How you are feeling.  If you are feeling the baby move.  If you have had any abnormal symptoms, such as leaking fluid, bleeding, severe headaches, or abdominal cramping.  If you are using any tobacco products, including cigarettes, chewing tobacco, and electronic cigarettes.  If you have any questions. Other tests or screenings that may be performed during your third trimester include:  Blood tests that check for low iron levels (anemia).  Fetal testing to check the health,  activity level, and growth of the fetus. Testing is done if you have certain medical conditions or if there are problems during the pregnancy.  Nonstress test (NST). This test checks the health of your baby to make sure there are no signs of problems, such as the baby not getting enough oxygen. During this test, a belt is placed around your belly. The baby is made to move, and its heart rate is monitored during movement. What is false labor? False labor is a condition in which you feel small, irregular tightenings of the muscles in the womb (contractions) that eventually go away. These are called Braxton Hicks contractions. Contractions may last for hours, days, or even weeks before true labor sets in. If contractions come at regular intervals, become more frequent, increase in intensity, or become painful, you should see your health care provider. What are the signs of labor?  Abdominal cramps.  Regular contractions that start at 10 minutes apart and become stronger and more frequent with time.  Contractions that start on the top of the uterus and spread down to the lower abdomen and back.  Increased pelvic pressure and dull back pain.  A watery or bloody mucus discharge that comes from the vagina.  Leaking of amniotic fluid. This is also known as your "water breaking." It could be a slow trickle or a gush. Let your doctor know if it has a color or strange odor. If you have any of these signs, call your health care provider right away, even if it is before your due date. Follow these instructions at home: Eating and drinking  Continue to eat regular, healthy meals.  Do not eat:  Raw meat or meat spreads.  Unpasteurized milk or cheese.  Unpasteurized juice.  Store-made salad.  Refrigerated smoked seafood.  Hot dogs or deli meat, unless they are piping hot.  More than 6 ounces of albacore tuna a week.  Shark, swordfish, king mackerel, or tile fish.  Store-made salads.  Raw  sprouts, such as mung bean or alfalfa sprouts.  Take prenatal vitamins as told by your health care provider.  Take 1000 mg of calcium daily as told by your health care provider.  If you develop constipation:  Take over-the-counter or prescription medicines.  Drink enough fluid to keep your urine clear or pale yellow.  Eat foods that are high in fiber, such as fresh fruits and vegetables, whole grains, and beans.  Limit foods that are high in fat and processed sugars, such as fried and sweet foods. Activity  Exercise only as directed by your health care provider. Healthy pregnant women should aim for 2 hours and 30 minutes of moderate exercise per week. If you experience any pain or discomfort while exercising, stop.  Avoid heavy lifting.  Do not exercise in extreme heat or humidity, or at high altitudes.  Wear low-heel, comfortable shoes.  Practice good posture.  Do not travel far distances unless it is absolutely necessary and only with the approval   of your health care provider.  Wear your seat belt at all times while in a car, on a bus, or on a plane.  Take frequent breaks and rest with your legs elevated if you have leg cramps or low back pain.  Do not use hot tubs, steam rooms, or saunas.  You may continue to have sex unless your health care provider tells you otherwise. Lifestyle  Do not use any products that contain nicotine or tobacco, such as cigarettes and e-cigarettes. If you need help quitting, ask your health care provider.  Do not drink alcohol.  Do not use any medicinal herbs or unprescribed drugs. These chemicals affect the formation and growth of the baby.  If you develop varicose veins:  Wear support pantyhose or compression stockings as told by your healthcare provider.  Elevate your feet for 15 minutes, 3-4 times a day.  Wear a supportive maternity bra to help with breast tenderness. General instructions  Take over-the-counter and prescription  medicines only as told by your health care provider. There are medicines that are either safe or unsafe to take during pregnancy.  Take warm sitz baths to soothe any pain or discomfort caused by hemorrhoids. Use hemorrhoid cream or witch hazel if your health care provider approves.  Avoid cat litter boxes and soil used by cats. These carry germs that can cause birth defects in the baby. If you have a cat, ask someone to clean the litter box for you.  To prepare for the arrival of your baby:  Take prenatal classes to understand, practice, and ask questions about the labor and delivery.  Make a trial run to the hospital.  Visit the hospital and tour the maternity area.  Arrange for maternity or paternity leave through employers.  Arrange for family and friends to take care of pets while you are in the hospital.  Purchase a rear-facing car seat and make sure you know how to install it in your car.  Pack your hospital bag.  Prepare the baby's nursery. Make sure to remove all pillows and stuffed animals from the baby's crib to prevent suffocation.  Visit your dentist if you have not gone during your pregnancy. Use a soft toothbrush to brush your teeth and be gentle when you floss.  Keep all prenatal follow-up visits as told by your health care provider. This is important. Contact a health care provider if:  You are unsure if you are in labor or if your water has broken.  You become dizzy.  You have mild pelvic cramps, pelvic pressure, or nagging pain in your abdominal area.  You have lower back pain.  You have persistent nausea, vomiting, or diarrhea.  You have an unusual or bad smelling vaginal discharge.  You have pain when you urinate. Get help right away if:  You have a fever.  You are leaking fluid from your vagina.  You have spotting or bleeding from your vagina.  You have severe abdominal pain or cramping.  You have rapid weight loss or weight gain.  You have  shortness of breath with chest pain.  You notice sudden or extreme swelling of your face, hands, ankles, feet, or legs.  Your baby makes fewer than 10 movements in 2 hours.  You have severe headaches that do not go away with medicine.  You have vision changes. Summary  The third trimester is from week 29 through week 40, months 7 through 9. The third trimester is a time when the unborn baby (fetus)   is growing rapidly.  During the third trimester, your discomfort may increase as you and your baby continue to gain weight. You may have abdominal, leg, and back pain, sleeping problems, and an increased need to urinate.  During the third trimester your breasts will keep growing and they will continue to become tender. A yellow fluid (colostrum) may leak from your breasts. This is the first milk you are producing for your baby.  False labor is a condition in which you feel small, irregular tightenings of the muscles in the womb (contractions) that eventually go away. These are called Braxton Hicks contractions. Contractions may last for hours, days, or even weeks before true labor sets in.  Signs of labor can include: abdominal cramps; regular contractions that start at 10 minutes apart and become stronger and more frequent with time; watery or bloody mucus discharge that comes from the vagina; increased pelvic pressure and dull back pain; and leaking of amniotic fluid. This information is not intended to replace advice given to you by your health care provider. Make sure you discuss any questions you have with your health care provider. Document Released: 05/03/2001 Document Revised: 10/15/2015 Document Reviewed: 07/10/2012 [employer] Interactive Patient Education  2017 [employer]   Breastfeeding Deciding to breastfeed is one of the best choices you can make for you and your baby. A change in hormones during pregnancy causes your breast tissue to grow and increases the number and size of your  milk ducts. These hormones also allow proteins, sugars, and fats from your blood supply to make breast milk in your milk-producing glands. Hormones prevent breast milk from being released before your baby is born as well as prompt milk flow after birth. Once breastfeeding has begun, thoughts of your baby, as well as his or her sucking or crying, can stimulate the release of milk from your milk-producing glands. Benefits of breastfeeding For Your Baby  Your first milk (colostrum) helps your baby's digestive system function better.  There are antibodies in your milk that help your baby fight off infections.  Your baby has a lower incidence of asthma, allergies, and sudden infant death syndrome.  The nutrients in breast milk are better for your baby than infant formulas and are designed uniquely for your baby's needs.  Breast milk improves your baby's brain development.  Your baby is less likely to develop other conditions, such as childhood obesity, asthma, or type 2 diabetes mellitus. For You  Breastfeeding helps to create a very special bond between you and your baby.  Breastfeeding is convenient. Breast milk is always available at the correct temperature and costs nothing.  Breastfeeding helps to burn calories and helps you lose the weight gained during pregnancy.  Breastfeeding makes your uterus contract to its prepregnancy size faster and slows bleeding (lochia) after you give birth.  Breastfeeding helps to lower your risk of developing type 2 diabetes mellitus, osteoporosis, and breast or ovarian cancer later in life. Signs that your baby is hungry Early Signs of Hunger  Increased alertness or activity.  Stretching.  Movement of the head from side to side.  Movement of the head and opening of the mouth when the corner of the mouth or cheek is stroked (rooting).  Increased sucking sounds, smacking lips, cooing, sighing, or squeaking.  Hand-to-mouth movements.  Increased  sucking of fingers or hands. Late Signs of Hunger  Fussing.  Intermittent crying. Extreme Signs of Hunger  Signs of extreme hunger will require calming and consoling before your baby will   be able to breastfeed successfully. Do not wait for the following signs of extreme hunger to occur before you initiate breastfeeding:  Restlessness.  A loud, strong cry.  Screaming. Breastfeeding basics  Breastfeeding Initiation  Find a comfortable place to sit or lie down, with your neck and back well supported.  Place a pillow or rolled up blanket under your baby to bring him or her to the level of your breast (if you are seated). Nursing pillows are specially designed to help support your arms and your baby while you breastfeed.  Make sure that your baby's abdomen is facing your abdomen.  Gently massage your breast. With your fingertips, massage from your chest wall toward your nipple in a circular motion. This encourages milk flow. You may need to continue this action during the feeding if your milk flows slowly.  Support your breast with 4 fingers underneath and your thumb above your nipple. Make sure your fingers are well away from your nipple and your baby's mouth.  Stroke your baby's lips gently with your finger or nipple.  When your baby's mouth is open wide enough, quickly bring your baby to your breast, placing your entire nipple and as much of the colored area around your nipple (areola) as possible into your baby's mouth.  More areola should be visible above your baby's upper lip than below the lower lip.  Your baby's tongue should be between his or her lower gum and your breast.  Ensure that your baby's mouth is correctly positioned around your nipple (latched). Your baby's lips should create a seal on your breast and be turned out (everted).  It is common for your baby to suck about 2-3 minutes in order to start the flow of breast milk. Latching  Teaching your baby how to latch  on to your breast properly is very important. An improper latch can cause nipple pain and decreased milk supply for you and poor weight gain in your baby. Also, if your baby is not latched onto your nipple properly, he or she may swallow some air during feeding. This can make your baby fussy. Burping your baby when you switch breasts during the feeding can help to get rid of the air. However, teaching your baby to latch on properly is still the best way to prevent fussiness from swallowing air while breastfeeding. Signs that your baby has successfully latched on to your nipple:  Silent tugging or silent sucking, without causing you pain.  Swallowing heard between every 3-4 sucks.  Muscle movement above and in front of his or her ears while sucking. Signs that your baby has not successfully latched on to nipple:  Sucking sounds or smacking sounds from your baby while breastfeeding.  Nipple pain. If you think your baby has not latched on correctly, slip your finger into the corner of your baby's mouth to break the suction and place it between your baby's gums. Attempt breastfeeding initiation again. Signs of Successful Breastfeeding  Signs from your baby:  A gradual decrease in the number of sucks or complete cessation of sucking.  Falling asleep.  Relaxation of his or her body.  Retention of a small amount of milk in his or her mouth.  Letting go of your breast by himself or herself. Signs from you:  Breasts that have increased in firmness, weight, and size 1-3 hours after feeding.  Breasts that are softer immediately after breastfeeding.  Increased milk volume, as well as a change in milk consistency and color by   the fifth day of breastfeeding.  Nipples that are not sore, cracked, or bleeding. Signs That Your Baby is Getting Enough Milk  Wetting at least 1-2 diapers during the first 24 hours after birth.  Wetting at least 5-6 diapers every 24 hours for the first week after  birth. The urine should be clear or pale yellow by 5 days after birth.  Wetting 6-8 diapers every 24 hours as your baby continues to grow and develop.  At least 3 stools in a 24-hour period by age 5 days. The stool should be soft and yellow.  At least 3 stools in a 24-hour period by age 7 days. The stool should be seedy and yellow.  No loss of weight greater than 10% of birth weight during the first 3 days of age.  Average weight gain of 4-7 ounces (113-198 g) per week after age 4 days.  Consistent daily weight gain by age 5 days, without weight loss after the age of 2 weeks. After a feeding, your baby may spit up a small amount. This is common. Breastfeeding frequency and duration Frequent feeding will help you make more milk and can prevent sore nipples and breast engorgement. Breastfeed when you feel the need to reduce the fullness of your breasts or when your baby shows signs of hunger. This is called "breastfeeding on demand." Avoid introducing a pacifier to your baby while you are working to establish breastfeeding (the first 4-6 weeks after your baby is born). After this time you may choose to use a pacifier. Research has shown that pacifier use during the first year of a baby's life decreases the risk of sudden infant death syndrome (SIDS). Allow your baby to feed on each breast as long as he or she wants. Breastfeed until your baby is finished feeding. When your baby unlatches or falls asleep while feeding from the first breast, offer the second breast. Because newborns are often sleepy in the first few weeks of life, you may need to awaken your baby to get him or her to feed. Breastfeeding times will vary from baby to baby. However, the following rules can serve as a guide to help you ensure that your baby is properly fed:  Newborns (babies 4 weeks of age or younger) may breastfeed every 1-3 hours.  Newborns should not go longer than 3 hours during the day or 5 hours during the night  without breastfeeding.  You should breastfeed your baby a minimum of 8 times in a 24-hour period until you begin to introduce solid foods to your baby at around 6 months of age. Breast milk pumping Pumping and storing breast milk allows you to ensure that your baby is exclusively fed your breast milk, even at times when you are unable to breastfeed. This is especially important if you are going back to work while you are still breastfeeding or when you are not able to be present during feedings. Your lactation consultant can give you guidelines on how long it is safe to store breast milk. A breast pump is a machine that allows you to pump milk from your breast into a sterile bottle. The pumped breast milk can then be stored in a refrigerator or freezer. Some breast pumps are operated by hand, while others use electricity. Ask your lactation consultant which type will work best for you. Breast pumps can be purchased, but some hospitals and breastfeeding support groups lease breast pumps on a monthly basis. A lactation consultant can teach you how to   hand express breast milk, if you prefer not to use a pump. Caring for your breasts while you breastfeed Nipples can become dry, cracked, and sore while breastfeeding. The following recommendations can help keep your breasts moisturized and healthy:  Avoid using soap on your nipples.  Wear a supportive bra. Although not required, special nursing bras and tank tops are designed to allow access to your breasts for breastfeeding without taking off your entire bra or top. Avoid wearing underwire-style bras or extremely tight bras.  Air dry your nipples for 3-4minutes after each feeding.  Use only cotton bra pads to absorb leaked breast milk. Leaking of breast milk between feedings is normal.  Use lanolin on your nipples after breastfeeding. Lanolin helps to maintain your skin's normal moisture barrier. If you use pure lanolin, you do not need to wash it off  before feeding your baby again. Pure lanolin is not toxic to your baby. You may also hand express a few drops of breast milk and gently massage that milk into your nipples and allow the milk to air dry. In the first few weeks after giving birth, some women experience extremely full breasts (engorgement). Engorgement can make your breasts feel heavy, warm, and tender to the touch. Engorgement peaks within 3-5 days after you give birth. The following recommendations can help ease engorgement:  Completely empty your breasts while breastfeeding or pumping. You may want to start by applying warm, moist heat (in the shower or with warm water-soaked hand towels) just before feeding or pumping. This increases circulation and helps the milk flow. If your baby does not completely empty your breasts while breastfeeding, pump any extra milk after he or she is finished.  Wear a snug bra (nursing or regular) or tank top for 1-2 days to signal your body to slightly decrease milk production.  Apply ice packs to your breasts, unless this is too uncomfortable for you.  Make sure that your baby is latched on and positioned properly while breastfeeding. If engorgement persists after 48 hours of following these recommendations, contact your health care provider or a lactation consultant. Overall health care recommendations while breastfeeding  Eat healthy foods. Alternate between meals and snacks, eating 3 of each per day. Because what you eat affects your breast milk, some of the foods may make your baby more irritable than usual. Avoid eating these foods if you are sure that they are negatively affecting your baby.  Drink milk, fruit juice, and water to satisfy your thirst (about 10 glasses a day).  Rest often, relax, and continue to take your prenatal vitamins to prevent fatigue, stress, and anemia.  Continue breast self-awareness checks.  Avoid chewing and smoking tobacco. Chemicals from cigarettes that pass  into breast milk and exposure to secondhand smoke may harm your baby.  Avoid alcohol and drug use, including marijuana. Some medicines that may be harmful to your baby can pass through breast milk. It is important to ask your health care provider before taking any medicine, including all over-the-counter and prescription medicine as well as vitamin and herbal supplements. It is possible to become pregnant while breastfeeding. If birth control is desired, ask your health care provider about options that will be safe for your baby. Contact a health care provider if:  You feel like you want to stop breastfeeding or have become frustrated with breastfeeding.  You have painful breasts or nipples.  Your nipples are cracked or bleeding.  Your breasts are red, tender, or warm.  You have   a swollen area on either breast.  You have a fever or chills.  You have nausea or vomiting.  You have drainage other than breast milk from your nipples.  Your breasts do not become full before feedings by the fifth day after you give birth.  You feel sad and depressed.  Your baby is too sleepy to eat well.  Your baby is having trouble sleeping.  Your baby is wetting less than 3 diapers in a 24-hour period.  Your baby has less than 3 stools in a 24-hour period.  Your baby's skin or the white part of his or her eyes becomes yellow.  Your baby is not gaining weight by 5 days of age. Get help right away if:  Your baby is overly tired (lethargic) and does not want to wake up and feed.  Your baby develops an unexplained fever. This information is not intended to replace advice given to you by your health care provider. Make sure you discuss any questions you have with your health care provider. Document Released: 05/09/2005 Document Revised: 10/21/2015 Document Reviewed: 10/31/2012 Elsevier Interactive Patient Education  2017 [employer]vier Inc.  

## 2016-04-07 LAB — CULTURE, OB URINE: Organism ID, Bacteria: NO GROWTH

## 2016-04-26 ENCOUNTER — Ambulatory Visit (INDEPENDENT_AMBULATORY_CARE_PROVIDER_SITE_OTHER): Payer: Medicaid Other | Admitting: Family Medicine

## 2016-04-26 DIAGNOSIS — Z3480 Encounter for supervision of other normal pregnancy, unspecified trimester: Secondary | ICD-10-CM

## 2016-04-26 DIAGNOSIS — Z3483 Encounter for supervision of other normal pregnancy, third trimester: Secondary | ICD-10-CM

## 2016-04-26 NOTE — Patient Instructions (Signed)
Third Trimester of Pregnancy The third trimester is from week 29 through week 40 (months 7 through 9). The third trimester is a time when the unborn baby (fetus) is growing rapidly. At the end of the ninth month, the fetus is about 20 inches in length and weighs 6-10 pounds. Body changes during your third trimester Your body goes through many changes during pregnancy. The changes vary from woman to woman. During the third trimester:  Your weight will continue to increase. You can expect to gain 25-35 pounds (11-16 kg) by the end of the pregnancy.  You may begin to get stretch marks on your hips, abdomen, and breasts.  You may urinate more often because the fetus is moving lower into your pelvis and pressing on your bladder.  You may develop or continue to have heartburn. This is caused by increased hormones that slow down muscles in the digestive tract.  You may develop or continue to have constipation because increased hormones slow digestion and cause the muscles that push waste through your intestines to relax.  You may develop hemorrhoids. These are swollen veins (varicose veins) in the rectum that can itch or be painful.  You may develop swollen, bulging veins (varicose veins) in your legs.  You may have increased body aches in the pelvis, back, or thighs. This is due to weight gain and increased hormones that are relaxing your joints.  You may have changes in your hair. These can include thickening of your hair, rapid growth, and changes in texture. Some women also have hair loss during or after pregnancy, or hair that feels dry or thin. Your hair will most likely return to normal after your baby is born.  Your breasts will continue to grow and they will continue to become tender. A yellow fluid (colostrum) may leak from your breasts. This is the first milk you are producing for your baby.  Your belly button may stick out.  You may notice more swelling in your hands, face, or  ankles.  You may have increased tingling or numbness in your hands, arms, and legs. The skin on your belly may also feel numb.  You may feel short of breath because of your expanding uterus.  You may have more problems sleeping. This can be caused by the size of your belly, increased need to urinate, and an increase in your body's metabolism.  You may notice the fetus "dropping," or moving lower in your abdomen.  You may have increased vaginal discharge.  Your cervix becomes thin and soft (effaced) near your due date. What to expect at prenatal visits You will have prenatal exams every 2 weeks until week 36. Then you will have weekly prenatal exams. During a routine prenatal visit:  You will be weighed to make sure you and the fetus are growing normally.  Your blood pressure will be taken.  Your abdomen will be measured to track your baby's growth.  The fetal heartbeat will be listened to.  Any test results from the previous visit will be discussed.  You may have a cervical check near your due date to see if you have effaced. At around 36 weeks, your health care provider will check your cervix. At the same time, your health care provider will also perform a test on the secretions of the vaginal tissue. This test is to determine if a type of bacteria, Group B streptococcus, is present. Your health care provider will explain this further. Your health care provider may ask you:    What your birth plan is.  How you are feeling.  If you are feeling the baby move.  If you have had any abnormal symptoms, such as leaking fluid, bleeding, severe headaches, or abdominal cramping.  If you are using any tobacco products, including cigarettes, chewing tobacco, and electronic cigarettes.  If you have any questions. Other tests or screenings that may be performed during your third trimester include:  Blood tests that check for low iron levels (anemia).  Fetal testing to check the health,  activity level, and growth of the fetus. Testing is done if you have certain medical conditions or if there are problems during the pregnancy.  Nonstress test (NST). This test checks the health of your baby to make sure there are no signs of problems, such as the baby not getting enough oxygen. During this test, a belt is placed around your belly. The baby is made to move, and its heart rate is monitored during movement. What is false labor? False labor is a condition in which you feel small, irregular tightenings of the muscles in the womb (contractions) that eventually go away. These are called Braxton Hicks contractions. Contractions may last for hours, days, or even weeks before true labor sets in. If contractions come at regular intervals, become more frequent, increase in intensity, or become painful, you should see your health care provider. What are the signs of labor?  Abdominal cramps.  Regular contractions that start at 10 minutes apart and become stronger and more frequent with time.  Contractions that start on the top of the uterus and spread down to the lower abdomen and back.  Increased pelvic pressure and dull back pain.  A watery or bloody mucus discharge that comes from the vagina.  Leaking of amniotic fluid. This is also known as your "water breaking." It could be a slow trickle or a gush. Let your doctor know if it has a color or strange odor. If you have any of these signs, call your health care provider right away, even if it is before your due date. Follow these instructions at home: Eating and drinking  Continue to eat regular, healthy meals.  Do not eat:  Raw meat or meat spreads.  Unpasteurized milk or cheese.  Unpasteurized juice.  Store-made salad.  Refrigerated smoked seafood.  Hot dogs or deli meat, unless they are piping hot.  More than 6 ounces of albacore tuna a week.  Shark, swordfish, king mackerel, or tile fish.  Store-made salads.  Raw  sprouts, such as mung bean or alfalfa sprouts.  Take prenatal vitamins as told by your health care provider.  Take 1000 mg of calcium daily as told by your health care provider.  If you develop constipation:  Take over-the-counter or prescription medicines.  Drink enough fluid to keep your urine clear or pale yellow.  Eat foods that are high in fiber, such as fresh fruits and vegetables, whole grains, and beans.  Limit foods that are high in fat and processed sugars, such as fried and sweet foods. Activity  Exercise only as directed by your health care provider. Healthy pregnant women should aim for 2 hours and 30 minutes of moderate exercise per week. If you experience any pain or discomfort while exercising, stop.  Avoid heavy lifting.  Do not exercise in extreme heat or humidity, or at high altitudes.  Wear low-heel, comfortable shoes.  Practice good posture.  Do not travel far distances unless it is absolutely necessary and only with the approval   of your health care provider.  Wear your seat belt at all times while in a car, on a bus, or on a plane.  Take frequent breaks and rest with your legs elevated if you have leg cramps or low back pain.  Do not use hot tubs, steam rooms, or saunas.  You may continue to have sex unless your health care provider tells you otherwise. Lifestyle  Do not use any products that contain nicotine or tobacco, such as cigarettes and e-cigarettes. If you need help quitting, ask your health care provider.  Do not drink alcohol.  Do not use any medicinal herbs or unprescribed drugs. These chemicals affect the formation and growth of the baby.  If you develop varicose veins:  Wear support pantyhose or compression stockings as told by your healthcare provider.  Elevate your feet for 15 minutes, 3-4 times a day.  Wear a supportive maternity bra to help with breast tenderness. General instructions  Take over-the-counter and prescription  medicines only as told by your health care provider. There are medicines that are either safe or unsafe to take during pregnancy.  Take warm sitz baths to soothe any pain or discomfort caused by hemorrhoids. Use hemorrhoid cream or witch hazel if your health care provider approves.  Avoid cat litter boxes and soil used by cats. These carry germs that can cause birth defects in the baby. If you have a cat, ask someone to clean the litter box for you.  To prepare for the arrival of your baby:  Take prenatal classes to understand, practice, and ask questions about the labor and delivery.  Make a trial run to the hospital.  Visit the hospital and tour the maternity area.  Arrange for maternity or paternity leave through employers.  Arrange for family and friends to take care of pets while you are in the hospital.  Purchase a rear-facing car seat and make sure you know how to install it in your car.  Pack your hospital bag.  Prepare the baby's nursery. Make sure to remove all pillows and stuffed animals from the baby's crib to prevent suffocation.  Visit your dentist if you have not gone during your pregnancy. Use a soft toothbrush to brush your teeth and be gentle when you floss.  Keep all prenatal follow-up visits as told by your health care provider. This is important. Contact a health care provider if:  You are unsure if you are in labor or if your water has broken.  You become dizzy.  You have mild pelvic cramps, pelvic pressure, or nagging pain in your abdominal area.  You have lower back pain.  You have persistent nausea, vomiting, or diarrhea.  You have an unusual or bad smelling vaginal discharge.  You have pain when you urinate. Get help right away if:  You have a fever.  You are leaking fluid from your vagina.  You have spotting or bleeding from your vagina.  You have severe abdominal pain or cramping.  You have rapid weight loss or weight gain.  You have  shortness of breath with chest pain.  You notice sudden or extreme swelling of your face, hands, ankles, feet, or legs.  Your baby makes fewer than 10 movements in 2 hours.  You have severe headaches that do not go away with medicine.  You have vision changes. Summary  The third trimester is from week 29 through week 40, months 7 through 9. The third trimester is a time when the unborn baby (fetus)   is growing rapidly.  During the third trimester, your discomfort may increase as you and your baby continue to gain weight. You may have abdominal, leg, and back pain, sleeping problems, and an increased need to urinate.  During the third trimester your breasts will keep growing and they will continue to become tender. A yellow fluid (colostrum) may leak from your breasts. This is the first milk you are producing for your baby.  False labor is a condition in which you feel small, irregular tightenings of the muscles in the womb (contractions) that eventually go away. These are called Braxton Hicks contractions. Contractions may last for hours, days, or even weeks before true labor sets in.  Signs of labor can include: abdominal cramps; regular contractions that start at 10 minutes apart and become stronger and more frequent with time; watery or bloody mucus discharge that comes from the vagina; increased pelvic pressure and dull back pain; and leaking of amniotic fluid. This information is not intended to replace advice given to you by your health care provider. Make sure you discuss any questions you have with your health care provider. Document Released: 05/03/2001 Document Revised: 10/15/2015 Document Reviewed: 07/10/2012 [employer] Interactive Patient Education  2017 [employer]   Breastfeeding Deciding to breastfeed is one of the best choices you can make for you and your baby. A change in hormones during pregnancy causes your breast tissue to grow and increases the number and size of your  milk ducts. These hormones also allow proteins, sugars, and fats from your blood supply to make breast milk in your milk-producing glands. Hormones prevent breast milk from being released before your baby is born as well as prompt milk flow after birth. Once breastfeeding has begun, thoughts of your baby, as well as his or her sucking or crying, can stimulate the release of milk from your milk-producing glands. Benefits of breastfeeding For Your Baby  Your first milk (colostrum) helps your baby's digestive system function better.  There are antibodies in your milk that help your baby fight off infections.  Your baby has a lower incidence of asthma, allergies, and sudden infant death syndrome.  The nutrients in breast milk are better for your baby than infant formulas and are designed uniquely for your baby's needs.  Breast milk improves your baby's brain development.  Your baby is less likely to develop other conditions, such as childhood obesity, asthma, or type 2 diabetes mellitus. For You  Breastfeeding helps to create a very special bond between you and your baby.  Breastfeeding is convenient. Breast milk is always available at the correct temperature and costs nothing.  Breastfeeding helps to burn calories and helps you lose the weight gained during pregnancy.  Breastfeeding makes your uterus contract to its prepregnancy size faster and slows bleeding (lochia) after you give birth.  Breastfeeding helps to lower your risk of developing type 2 diabetes mellitus, osteoporosis, and breast or ovarian cancer later in life. Signs that your baby is hungry Early Signs of Hunger  Increased alertness or activity.  Stretching.  Movement of the head from side to side.  Movement of the head and opening of the mouth when the corner of the mouth or cheek is stroked (rooting).  Increased sucking sounds, smacking lips, cooing, sighing, or squeaking.  Hand-to-mouth movements.  Increased  sucking of fingers or hands. Late Signs of Hunger  Fussing.  Intermittent crying. Extreme Signs of Hunger  Signs of extreme hunger will require calming and consoling before your baby will   be able to breastfeed successfully. Do not wait for the following signs of extreme hunger to occur before you initiate breastfeeding:  Restlessness.  A loud, strong cry.  Screaming. Breastfeeding basics  Breastfeeding Initiation  Find a comfortable place to sit or lie down, with your neck and back well supported.  Place a pillow or rolled up blanket under your baby to bring him or her to the level of your breast (if you are seated). Nursing pillows are specially designed to help support your arms and your baby while you breastfeed.  Make sure that your baby's abdomen is facing your abdomen.  Gently massage your breast. With your fingertips, massage from your chest wall toward your nipple in a circular motion. This encourages milk flow. You may need to continue this action during the feeding if your milk flows slowly.  Support your breast with 4 fingers underneath and your thumb above your nipple. Make sure your fingers are well away from your nipple and your baby's mouth.  Stroke your baby's lips gently with your finger or nipple.  When your baby's mouth is open wide enough, quickly bring your baby to your breast, placing your entire nipple and as much of the colored area around your nipple (areola) as possible into your baby's mouth.  More areola should be visible above your baby's upper lip than below the lower lip.  Your baby's tongue should be between his or her lower gum and your breast.  Ensure that your baby's mouth is correctly positioned around your nipple (latched). Your baby's lips should create a seal on your breast and be turned out (everted).  It is common for your baby to suck about 2-3 minutes in order to start the flow of breast milk. Latching  Teaching your baby how to latch  on to your breast properly is very important. An improper latch can cause nipple pain and decreased milk supply for you and poor weight gain in your baby. Also, if your baby is not latched onto your nipple properly, he or she may swallow some air during feeding. This can make your baby fussy. Burping your baby when you switch breasts during the feeding can help to get rid of the air. However, teaching your baby to latch on properly is still the best way to prevent fussiness from swallowing air while breastfeeding. Signs that your baby has successfully latched on to your nipple:  Silent tugging or silent sucking, without causing you pain.  Swallowing heard between every 3-4 sucks.  Muscle movement above and in front of his or her ears while sucking. Signs that your baby has not successfully latched on to nipple:  Sucking sounds or smacking sounds from your baby while breastfeeding.  Nipple pain. If you think your baby has not latched on correctly, slip your finger into the corner of your baby's mouth to break the suction and place it between your baby's gums. Attempt breastfeeding initiation again. Signs of Successful Breastfeeding  Signs from your baby:  A gradual decrease in the number of sucks or complete cessation of sucking.  Falling asleep.  Relaxation of his or her body.  Retention of a small amount of milk in his or her mouth.  Letting go of your breast by himself or herself. Signs from you:  Breasts that have increased in firmness, weight, and size 1-3 hours after feeding.  Breasts that are softer immediately after breastfeeding.  Increased milk volume, as well as a change in milk consistency and color by   the fifth day of breastfeeding.  Nipples that are not sore, cracked, or bleeding. Signs That Your Baby is Getting Enough Milk  Wetting at least 1-2 diapers during the first 24 hours after birth.  Wetting at least 5-6 diapers every 24 hours for the first week after  birth. The urine should be clear or pale yellow by 5 days after birth.  Wetting 6-8 diapers every 24 hours as your baby continues to grow and develop.  At least 3 stools in a 24-hour period by age 5 days. The stool should be soft and yellow.  At least 3 stools in a 24-hour period by age 7 days. The stool should be seedy and yellow.  No loss of weight greater than 10% of birth weight during the first 3 days of age.  Average weight gain of 4-7 ounces (113-198 g) per week after age 4 days.  Consistent daily weight gain by age 5 days, without weight loss after the age of 2 weeks. After a feeding, your baby may spit up a small amount. This is common. Breastfeeding frequency and duration Frequent feeding will help you make more milk and can prevent sore nipples and breast engorgement. Breastfeed when you feel the need to reduce the fullness of your breasts or when your baby shows signs of hunger. This is called "breastfeeding on demand." Avoid introducing a pacifier to your baby while you are working to establish breastfeeding (the first 4-6 weeks after your baby is born). After this time you may choose to use a pacifier. Research has shown that pacifier use during the first year of a baby's life decreases the risk of sudden infant death syndrome (SIDS). Allow your baby to feed on each breast as long as he or she wants. Breastfeed until your baby is finished feeding. When your baby unlatches or falls asleep while feeding from the first breast, offer the second breast. Because newborns are often sleepy in the first few weeks of life, you may need to awaken your baby to get him or her to feed. Breastfeeding times will vary from baby to baby. However, the following rules can serve as a guide to help you ensure that your baby is properly fed:  Newborns (babies 4 weeks of age or younger) may breastfeed every 1-3 hours.  Newborns should not go longer than 3 hours during the day or 5 hours during the night  without breastfeeding.  You should breastfeed your baby a minimum of 8 times in a 24-hour period until you begin to introduce solid foods to your baby at around 6 months of age. Breast milk pumping Pumping and storing breast milk allows you to ensure that your baby is exclusively fed your breast milk, even at times when you are unable to breastfeed. This is especially important if you are going back to work while you are still breastfeeding or when you are not able to be present during feedings. Your lactation consultant can give you guidelines on how long it is safe to store breast milk. A breast pump is a machine that allows you to pump milk from your breast into a sterile bottle. The pumped breast milk can then be stored in a refrigerator or freezer. Some breast pumps are operated by hand, while others use electricity. Ask your lactation consultant which type will work best for you. Breast pumps can be purchased, but some hospitals and breastfeeding support groups lease breast pumps on a monthly basis. A lactation consultant can teach you how to   hand express breast milk, if you prefer not to use a pump. Caring for your breasts while you breastfeed Nipples can become dry, cracked, and sore while breastfeeding. The following recommendations can help keep your breasts moisturized and healthy:  Avoid using soap on your nipples.  Wear a supportive bra. Although not required, special nursing bras and tank tops are designed to allow access to your breasts for breastfeeding without taking off your entire bra or top. Avoid wearing underwire-style bras or extremely tight bras.  Air dry your nipples for 3-4minutes after each feeding.  Use only cotton bra pads to absorb leaked breast milk. Leaking of breast milk between feedings is normal.  Use lanolin on your nipples after breastfeeding. Lanolin helps to maintain your skin's normal moisture barrier. If you use pure lanolin, you do not need to wash it off  before feeding your baby again. Pure lanolin is not toxic to your baby. You may also hand express a few drops of breast milk and gently massage that milk into your nipples and allow the milk to air dry. In the first few weeks after giving birth, some women experience extremely full breasts (engorgement). Engorgement can make your breasts feel heavy, warm, and tender to the touch. Engorgement peaks within 3-5 days after you give birth. The following recommendations can help ease engorgement:  Completely empty your breasts while breastfeeding or pumping. You may want to start by applying warm, moist heat (in the shower or with warm water-soaked hand towels) just before feeding or pumping. This increases circulation and helps the milk flow. If your baby does not completely empty your breasts while breastfeeding, pump any extra milk after he or she is finished.  Wear a snug bra (nursing or regular) or tank top for 1-2 days to signal your body to slightly decrease milk production.  Apply ice packs to your breasts, unless this is too uncomfortable for you.  Make sure that your baby is latched on and positioned properly while breastfeeding. If engorgement persists after 48 hours of following these recommendations, contact your health care provider or a lactation consultant. Overall health care recommendations while breastfeeding  Eat healthy foods. Alternate between meals and snacks, eating 3 of each per day. Because what you eat affects your breast milk, some of the foods may make your baby more irritable than usual. Avoid eating these foods if you are sure that they are negatively affecting your baby.  Drink milk, fruit juice, and water to satisfy your thirst (about 10 glasses a day).  Rest often, relax, and continue to take your prenatal vitamins to prevent fatigue, stress, and anemia.  Continue breast self-awareness checks.  Avoid chewing and smoking tobacco. Chemicals from cigarettes that pass  into breast milk and exposure to secondhand smoke may harm your baby.  Avoid alcohol and drug use, including marijuana. Some medicines that may be harmful to your baby can pass through breast milk. It is important to ask your health care provider before taking any medicine, including all over-the-counter and prescription medicine as well as vitamin and herbal supplements. It is possible to become pregnant while breastfeeding. If birth control is desired, ask your health care provider about options that will be safe for your baby. Contact a health care provider if:  You feel like you want to stop breastfeeding or have become frustrated with breastfeeding.  You have painful breasts or nipples.  Your nipples are cracked or bleeding.  Your breasts are red, tender, or warm.  You have   a swollen area on either breast.  You have a fever or chills.  You have nausea or vomiting.  You have drainage other than breast milk from your nipples.  Your breasts do not become full before feedings by the fifth day after you give birth.  You feel sad and depressed.  Your baby is too sleepy to eat well.  Your baby is having trouble sleeping.  Your baby is wetting less than 3 diapers in a 24-hour period.  Your baby has less than 3 stools in a 24-hour period.  Your baby's skin or the white part of his or her eyes becomes yellow.  Your baby is not gaining weight by 5 days of age. Get help right away if:  Your baby is overly tired (lethargic) and does not want to wake up and feed.  Your baby develops an unexplained fever. This information is not intended to replace advice given to you by your health care provider. Make sure you discuss any questions you have with your health care provider. Document Released: 05/09/2005 Document Revised: 10/21/2015 Document Reviewed: 10/31/2012 Elsevier Interactive Patient Education  2017 [employer]vier Inc.  

## 2016-04-26 NOTE — Progress Notes (Signed)
   PRENATAL VISIT NOTE  Subjective:  Sherry Spence is a 25 y.o. G2P1001 at [redacted]w[redacted]d being seen today for ongoing prenatal care.  She is currently monitored for the following issues for this low-risk pregnancy and has Supervision of normal pregnancy, antepartum; BMI 40.0-44.9, adult ([place]); Obesity in pregnancy; UTI in pregnancy; Nausea and vomiting during pregnancy; and Late prenatal care on her problem list.  Patient reports no complaints.  Contractions: Irregular. Vag. Bleeding: None.  Movement: Present. Denies leaking of fluid.   The following portions of the patient's history were reviewed and updated as appropriate: allergies, current medications, past family history, past medical history, past social history, past surgical history and problem list. Problem list updated.  Objective:   Vitals:   04/26/16 1402  BP: 134/73  Pulse: 98  Weight: 250 lb (113.4 kg)    Fetal Status: Fetal Heart Rate (bpm): 145   Movement: Present     General:  Alert, oriented and cooperative. Patient is in no acute distress.  Skin: Skin is warm and dry. No rash noted.   Cardiovascular: Normal heart rate noted  Respiratory: Normal respiratory effort, no problems with respiration noted  Abdomen: Soft, gravid, appropriate for gestational age. Pain/Pressure: Present     Pelvic:  Cervical exam deferred        Extremities: Normal range of motion.  Edema: Trace  Mental Status: Normal mood and affect. Normal behavior. Normal judgment and thought content.   Assessment and Plan:  Pregnancy: G2P1001 at [redacted]w[redacted]d  1. Supervision of other normal pregnancy, antepartum Continue routine prenatal care.   Preterm labor symptoms and general obstetric precautions including but not limited to vaginal bleeding, contractions, leaking of fluid and fetal movement were reviewed in detail with the patient. Please refer to After Visit Summary for other counseling recommendations.  Return in 2 weeks (on 05/10/2016).   Donnamae Jude, MD

## 2016-05-09 ENCOUNTER — Ambulatory Visit (INDEPENDENT_AMBULATORY_CARE_PROVIDER_SITE_OTHER): Payer: Medicaid Other | Admitting: Obstetrics & Gynecology

## 2016-05-09 DIAGNOSIS — Z3483 Encounter for supervision of other normal pregnancy, third trimester: Secondary | ICD-10-CM

## 2016-05-09 DIAGNOSIS — Z3480 Encounter for supervision of other normal pregnancy, unspecified trimester: Secondary | ICD-10-CM

## 2016-05-09 NOTE — Patient Instructions (Signed)
Return to clinic for any scheduled appointments or obstetric concerns, or go to MAU for evaluation  

## 2016-05-09 NOTE — Progress Notes (Signed)
   PRENATAL VISIT NOTE  Subjective:  Sherry Spence is a 25 y.o. G2P1001 at [redacted]w[redacted]d being seen today for ongoing prenatal care.  She is currently monitored for the following issues for this low-risk pregnancy and has Supervision of normal pregnancy, antepartum; BMI 40.0-44.9, adult ([place]); Obesity in pregnancy; Nausea and vomiting during pregnancy; and Late prenatal care on her problem list.  Patient reports no complaints.  Contractions: Irregular. Vag. Bleeding: None.  Movement: Present. Denies leaking of fluid.   The following portions of the patient's history were reviewed and updated as appropriate: allergies, current medications, past family history, past medical history, past social history, past surgical history and problem list. Problem list updated.  Objective:   Vitals:   05/09/16 1438  BP: 128/78  Pulse: (!) 109  Weight: 250 lb (113.4 kg)    Fetal Status: Fetal Heart Rate (bpm): 141 Fundal Height: 34 cm Movement: Present     General:  Alert, oriented and cooperative. Patient is in no acute distress.  Skin: Skin is warm and dry. No rash noted.   Cardiovascular: Normal heart rate noted  Respiratory: Normal respiratory effort, no problems with respiration noted  Abdomen: Soft, gravid, appropriate for gestational age. Pain/Pressure: Present     Pelvic:  Cervical exam deferred        Extremities: Normal range of motion.  Edema: Trace  Mental Status: Normal mood and affect. Normal behavior. Normal judgment and thought content.   Assessment and Plan:  Pregnancy: G2P1001 at [redacted]w[redacted]d  1. Supervision of other normal pregnancy, antepartum Preterm labor symptoms and general obstetric precautions including but not limited to vaginal bleeding, contractions, leaking of fluid and fetal movement were reviewed in detail with the patient. Please refer to After Visit Summary for other counseling recommendations.  Return in about 2 weeks (around 05/23/2016) for OB Visit, Pelvic  cultures.   Osborne Oman, MD

## 2016-05-23 NOTE — L&D Delivery Note (Signed)
Delivery Note Progressed to complete dilation and crowned with first push.  At 2:57 PM a viable and healthy female was delivered via Vaginal, Spontaneous Delivery (Presentation: OA).  APGAR: , ; weight  .   Placenta status: Spontaneous and grossly intact with 3 vessel Cord:  with the following complications: none  Anterior shoulder delivered immediately.  Posterior shoulder required McRoberts and grasping of axilla to get delivery accomplished.  Anesthesia:  epidural Episiotomy:  none Lacerations:  Small periurethral on left, not bleeding, not repaired Suture Repair: none Est. Blood Loss (mL):  100  Mom to postpartum.  Baby to Couplet care / Skin to Skin.  Hansel Feinstein 06/18/2016, 3:18 PM

## 2016-05-24 ENCOUNTER — Ambulatory Visit (INDEPENDENT_AMBULATORY_CARE_PROVIDER_SITE_OTHER): Payer: Medicaid Other | Admitting: Family Medicine

## 2016-05-24 ENCOUNTER — Other Ambulatory Visit (HOSPITAL_COMMUNITY)
Admission: RE | Admit: 2016-05-24 | Discharge: 2016-05-24 | Disposition: A | Discharge status: Home / Self Care | Payer: Medicaid Other | Source: Ambulatory Visit | Attending: Family Medicine | Admitting: Family Medicine

## 2016-05-24 VITALS — BP 122/82 | HR 106 | Wt 252.0 lb

## 2016-05-24 DIAGNOSIS — Z3480 Encounter for supervision of other normal pregnancy, unspecified trimester: Secondary | ICD-10-CM

## 2016-05-24 DIAGNOSIS — Z3483 Encounter for supervision of other normal pregnancy, third trimester: Secondary | ICD-10-CM

## 2016-05-24 DIAGNOSIS — Z113 Encounter for screening for infections with a predominantly sexual mode of transmission: Secondary | ICD-10-CM | POA: Diagnosis not present

## 2016-05-24 NOTE — Progress Notes (Signed)
   PRENATAL VISIT NOTE  Subjective:  Sherry Spence is a 26 y.o. G2P1001 at [redacted]w[redacted]d being seen today for ongoing prenatal care.  She is currently monitored for the following issues for this low-risk pregnancy and has Supervision of normal pregnancy, antepartum; BMI 40.0-44.9, adult ([place]); Obesity in pregnancy; Nausea and vomiting during pregnancy; and Late prenatal care on her problem list.  Patient reports no complaints.  Contractions: Irregular. Vag. Bleeding: None.  Movement: Present. Denies leaking of fluid.   The following portions of the patient's history were reviewed and updated as appropriate: allergies, current medications, past family history, past medical history, past social history, past surgical history and problem list. Problem list updated.  Objective:   Vitals:   05/24/16 1540  BP: 122/82  Pulse: (!) 106  Weight: 252 lb (114.3 kg)    Fetal Status: Fetal Heart Rate (bpm): 138 Fundal Height: 38 cm Movement: Present  Presentation: Vertex  General:  Alert, oriented and cooperative. Patient is in no acute distress.  Skin: Skin is warm and dry. No rash noted.   Cardiovascular: Normal heart rate noted  Respiratory: Normal respiratory effort, no problems with respiration noted  Abdomen: Soft, gravid, appropriate for gestational age. Pain/Pressure: Present     Pelvic:  Cervical exam performed Dilation: 2.5 Effacement (%): 50 Station: -3  Extremities: Normal range of motion.  Edema: Trace  Mental Status: Normal mood and affect. Normal behavior. Normal judgment and thought content.   Assessment and Plan:  Pregnancy: G2P1001 at [redacted]w[redacted]d  1. Supervision of other normal pregnancy, antepartum Continue routine prenatal care.  - Culture, beta strep (group b only) - GC/Chlamydia probe amp ([place])not at [facility]  Preterm labor symptoms and general obstetric precautions including but not limited to vaginal bleeding, contractions, leaking of fluid and fetal movement were  reviewed in detail with the patient. Please refer to After Visit Summary for other counseling recommendations.  Return in 1 week (on 05/31/2016).   Donnamae Jude, MD

## 2016-05-24 NOTE — Patient Instructions (Signed)
Third Trimester of Pregnancy The third trimester is from week 29 through week 40 (months 7 through 9). The third trimester is a time when the unborn baby (fetus) is growing rapidly. At the end of the ninth month, the fetus is about 20 inches in length and weighs 6-10 pounds. Body changes during your third trimester Your body goes through many changes during pregnancy. The changes vary from woman to woman. During the third trimester:  Your weight will continue to increase. You can expect to gain 25-35 pounds (11-16 kg) by the end of the pregnancy.  You may begin to get stretch marks on your hips, abdomen, and breasts.  You may urinate more often because the fetus is moving lower into your pelvis and pressing on your bladder.  You may develop or continue to have heartburn. This is caused by increased hormones that slow down muscles in the digestive tract.  You may develop or continue to have constipation because increased hormones slow digestion and cause the muscles that push waste through your intestines to relax.  You may develop hemorrhoids. These are swollen veins (varicose veins) in the rectum that can itch or be painful.  You may develop swollen, bulging veins (varicose veins) in your legs.  You may have increased body aches in the pelvis, back, or thighs. This is due to weight gain and increased hormones that are relaxing your joints.  You may have changes in your hair. These can include thickening of your hair, rapid growth, and changes in texture. Some women also have hair loss during or after pregnancy, or hair that feels dry or thin. Your hair will most likely return to normal after your baby is born.  Your breasts will continue to grow and they will continue to become tender. A yellow fluid (colostrum) may leak from your breasts. This is the first milk you are producing for your baby.  Your belly button may stick out.  You may notice more swelling in your hands, face, or  ankles.  You may have increased tingling or numbness in your hands, arms, and legs. The skin on your belly may also feel numb.  You may feel short of breath because of your expanding uterus.  You may have more problems sleeping. This can be caused by the size of your belly, increased need to urinate, and an increase in your body's metabolism.  You may notice the fetus "dropping," or moving lower in your abdomen.  You may have increased vaginal discharge.  Your cervix becomes thin and soft (effaced) near your due date. What to expect at prenatal visits You will have prenatal exams every 2 weeks until week 36. Then you will have weekly prenatal exams. During a routine prenatal visit:  You will be weighed to make sure you and the fetus are growing normally.  Your blood pressure will be taken.  Your abdomen will be measured to track your baby's growth.  The fetal heartbeat will be listened to.  Any test results from the previous visit will be discussed.  You may have a cervical check near your due date to see if you have effaced. At around 36 weeks, your health care provider will check your cervix. At the same time, your health care provider will also perform a test on the secretions of the vaginal tissue. This test is to determine if a type of bacteria, Group B streptococcus, is present. Your health care provider will explain this further. Your health care provider may ask you:    What your birth plan is.  How you are feeling.  If you are feeling the baby move.  If you have had any abnormal symptoms, such as leaking fluid, bleeding, severe headaches, or abdominal cramping.  If you are using any tobacco products, including cigarettes, chewing tobacco, and electronic cigarettes.  If you have any questions. Other tests or screenings that may be performed during your third trimester include:  Blood tests that check for low iron levels (anemia).  Fetal testing to check the health,  activity level, and growth of the fetus. Testing is done if you have certain medical conditions or if there are problems during the pregnancy.  Nonstress test (NST). This test checks the health of your baby to make sure there are no signs of problems, such as the baby not getting enough oxygen. During this test, a belt is placed around your belly. The baby is made to move, and its heart rate is monitored during movement. What is false labor? False labor is a condition in which you feel small, irregular tightenings of the muscles in the womb (contractions) that eventually go away. These are called Braxton Hicks contractions. Contractions may last for hours, days, or even weeks before true labor sets in. If contractions come at regular intervals, become more frequent, increase in intensity, or become painful, you should see your health care provider. What are the signs of labor?  Abdominal cramps.  Regular contractions that start at 10 minutes apart and become stronger and more frequent with time.  Contractions that start on the top of the uterus and spread down to the lower abdomen and back.  Increased pelvic pressure and dull back pain.  A watery or bloody mucus discharge that comes from the vagina.  Leaking of amniotic fluid. This is also known as your "water breaking." It could be a slow trickle or a gush. Let your doctor know if it has a color or strange odor. If you have any of these signs, call your health care provider right away, even if it is before your due date. Follow these instructions at home: Eating and drinking  Continue to eat regular, healthy meals.  Do not eat:  Raw meat or meat spreads.  Unpasteurized milk or cheese.  Unpasteurized juice.  Store-made salad.  Refrigerated smoked seafood.  Hot dogs or deli meat, unless they are piping hot.  More than 6 ounces of albacore tuna a week.  Shark, swordfish, king mackerel, or tile fish.  Store-made salads.  Raw  sprouts, such as mung bean or alfalfa sprouts.  Take prenatal vitamins as told by your health care provider.  Take 1000 mg of calcium daily as told by your health care provider.  If you develop constipation:  Take over-the-counter or prescription medicines.  Drink enough fluid to keep your urine clear or pale yellow.  Eat foods that are high in fiber, such as fresh fruits and vegetables, whole grains, and beans.  Limit foods that are high in fat and processed sugars, such as fried and sweet foods. Activity  Exercise only as directed by your health care provider. Healthy pregnant women should aim for 2 hours and 30 minutes of moderate exercise per week. If you experience any pain or discomfort while exercising, stop.  Avoid heavy lifting.  Do not exercise in extreme heat or humidity, or at high altitudes.  Wear low-heel, comfortable shoes.  Practice good posture.  Do not travel far distances unless it is absolutely necessary and only with the approval   of your health care provider.  Wear your seat belt at all times while in a car, on a bus, or on a plane.  Take frequent breaks and rest with your legs elevated if you have leg cramps or low back pain.  Do not use hot tubs, steam rooms, or saunas.  You may continue to have sex unless your health care provider tells you otherwise. Lifestyle  Do not use any products that contain nicotine or tobacco, such as cigarettes and e-cigarettes. If you need help quitting, ask your health care provider.  Do not drink alcohol.  Do not use any medicinal herbs or unprescribed drugs. These chemicals affect the formation and growth of the baby.  If you develop varicose veins:  Wear support pantyhose or compression stockings as told by your healthcare provider.  Elevate your feet for 15 minutes, 3-4 times a day.  Wear a supportive maternity bra to help with breast tenderness. General instructions  Take over-the-counter and prescription  medicines only as told by your health care provider. There are medicines that are either safe or unsafe to take during pregnancy.  Take warm sitz baths to soothe any pain or discomfort caused by hemorrhoids. Use hemorrhoid cream or witch hazel if your health care provider approves.  Avoid cat litter boxes and soil used by cats. These carry germs that can cause birth defects in the baby. If you have a cat, ask someone to clean the litter box for you.  To prepare for the arrival of your baby:  Take prenatal classes to understand, practice, and ask questions about the labor and delivery.  Make a trial run to the hospital.  Visit the hospital and tour the maternity area.  Arrange for maternity or paternity leave through employers.  Arrange for family and friends to take care of pets while you are in the hospital.  Purchase a rear-facing car seat and make sure you know how to install it in your car.  Pack your hospital bag.  Prepare the baby's nursery. Make sure to remove all pillows and stuffed animals from the baby's crib to prevent suffocation.  Visit your dentist if you have not gone during your pregnancy. Use a soft toothbrush to brush your teeth and be gentle when you floss.  Keep all prenatal follow-up visits as told by your health care provider. This is important. Contact a health care provider if:  You are unsure if you are in labor or if your water has broken.  You become dizzy.  You have mild pelvic cramps, pelvic pressure, or nagging pain in your abdominal area.  You have lower back pain.  You have persistent nausea, vomiting, or diarrhea.  You have an unusual or bad smelling vaginal discharge.  You have pain when you urinate. Get help right away if:  You have a fever.  You are leaking fluid from your vagina.  You have spotting or bleeding from your vagina.  You have severe abdominal pain or cramping.  You have rapid weight loss or weight gain.  You have  shortness of breath with chest pain.  You notice sudden or extreme swelling of your face, hands, ankles, feet, or legs.  Your baby makes fewer than 10 movements in 2 hours.  You have severe headaches that do not go away with medicine.  You have vision changes. Summary  The third trimester is from week 29 through week 40, months 7 through 9. The third trimester is a time when the unborn baby (fetus)   is growing rapidly.  During the third trimester, your discomfort may increase as you and your baby continue to gain weight. You may have abdominal, leg, and back pain, sleeping problems, and an increased need to urinate.  During the third trimester your breasts will keep growing and they will continue to become tender. A yellow fluid (colostrum) may leak from your breasts. This is the first milk you are producing for your baby.  False labor is a condition in which you feel small, irregular tightenings of the muscles in the womb (contractions) that eventually go away. These are called Braxton Hicks contractions. Contractions may last for hours, days, or even weeks before true labor sets in.  Signs of labor can include: abdominal cramps; regular contractions that start at 10 minutes apart and become stronger and more frequent with time; watery or bloody mucus discharge that comes from the vagina; increased pelvic pressure and dull back pain; and leaking of amniotic fluid. This information is not intended to replace advice given to you by your health care provider. Make sure you discuss any questions you have with your health care provider. Document Released: 05/03/2001 Document Revised: 10/15/2015 Document Reviewed: 07/10/2012 [employer] Interactive Patient Education  2017 [employer]   Breastfeeding Deciding to breastfeed is one of the best choices you can make for you and your baby. A change in hormones during pregnancy causes your breast tissue to grow and increases the number and size of your  milk ducts. These hormones also allow proteins, sugars, and fats from your blood supply to make breast milk in your milk-producing glands. Hormones prevent breast milk from being released before your baby is born as well as prompt milk flow after birth. Once breastfeeding has begun, thoughts of your baby, as well as his or her sucking or crying, can stimulate the release of milk from your milk-producing glands. Benefits of breastfeeding For Your Baby  Your first milk (colostrum) helps your baby's digestive system function better.  There are antibodies in your milk that help your baby fight off infections.  Your baby has a lower incidence of asthma, allergies, and sudden infant death syndrome.  The nutrients in breast milk are better for your baby than infant formulas and are designed uniquely for your baby's needs.  Breast milk improves your baby's brain development.  Your baby is less likely to develop other conditions, such as childhood obesity, asthma, or type 2 diabetes mellitus. For You  Breastfeeding helps to create a very special bond between you and your baby.  Breastfeeding is convenient. Breast milk is always available at the correct temperature and costs nothing.  Breastfeeding helps to burn calories and helps you lose the weight gained during pregnancy.  Breastfeeding makes your uterus contract to its prepregnancy size faster and slows bleeding (lochia) after you give birth.  Breastfeeding helps to lower your risk of developing type 2 diabetes mellitus, osteoporosis, and breast or ovarian cancer later in life. Signs that your baby is hungry Early Signs of Hunger  Increased alertness or activity.  Stretching.  Movement of the head from side to side.  Movement of the head and opening of the mouth when the corner of the mouth or cheek is stroked (rooting).  Increased sucking sounds, smacking lips, cooing, sighing, or squeaking.  Hand-to-mouth movements.  Increased  sucking of fingers or hands. Late Signs of Hunger  Fussing.  Intermittent crying. Extreme Signs of Hunger  Signs of extreme hunger will require calming and consoling before your baby will   be able to breastfeed successfully. Do not wait for the following signs of extreme hunger to occur before you initiate breastfeeding:  Restlessness.  A loud, strong cry.  Screaming. Breastfeeding basics  Breastfeeding Initiation  Find a comfortable place to sit or lie down, with your neck and back well supported.  Place a pillow or rolled up blanket under your baby to bring him or her to the level of your breast (if you are seated). Nursing pillows are specially designed to help support your arms and your baby while you breastfeed.  Make sure that your baby's abdomen is facing your abdomen.  Gently massage your breast. With your fingertips, massage from your chest wall toward your nipple in a circular motion. This encourages milk flow. You may need to continue this action during the feeding if your milk flows slowly.  Support your breast with 4 fingers underneath and your thumb above your nipple. Make sure your fingers are well away from your nipple and your baby's mouth.  Stroke your baby's lips gently with your finger or nipple.  When your baby's mouth is open wide enough, quickly bring your baby to your breast, placing your entire nipple and as much of the colored area around your nipple (areola) as possible into your baby's mouth.  More areola should be visible above your baby's upper lip than below the lower lip.  Your baby's tongue should be between his or her lower gum and your breast.  Ensure that your baby's mouth is correctly positioned around your nipple (latched). Your baby's lips should create a seal on your breast and be turned out (everted).  It is common for your baby to suck about 2-3 minutes in order to start the flow of breast milk. Latching  Teaching your baby how to latch  on to your breast properly is very important. An improper latch can cause nipple pain and decreased milk supply for you and poor weight gain in your baby. Also, if your baby is not latched onto your nipple properly, he or she may swallow some air during feeding. This can make your baby fussy. Burping your baby when you switch breasts during the feeding can help to get rid of the air. However, teaching your baby to latch on properly is still the best way to prevent fussiness from swallowing air while breastfeeding. Signs that your baby has successfully latched on to your nipple:  Silent tugging or silent sucking, without causing you pain.  Swallowing heard between every 3-4 sucks.  Muscle movement above and in front of his or her ears while sucking. Signs that your baby has not successfully latched on to nipple:  Sucking sounds or smacking sounds from your baby while breastfeeding.  Nipple pain. If you think your baby has not latched on correctly, slip your finger into the corner of your baby's mouth to break the suction and place it between your baby's gums. Attempt breastfeeding initiation again. Signs of Successful Breastfeeding  Signs from your baby:  A gradual decrease in the number of sucks or complete cessation of sucking.  Falling asleep.  Relaxation of his or her body.  Retention of a small amount of milk in his or her mouth.  Letting go of your breast by himself or herself. Signs from you:  Breasts that have increased in firmness, weight, and size 1-3 hours after feeding.  Breasts that are softer immediately after breastfeeding.  Increased milk volume, as well as a change in milk consistency and color by   the fifth day of breastfeeding.  Nipples that are not sore, cracked, or bleeding. Signs That Your Baby is Getting Enough Milk  Wetting at least 1-2 diapers during the first 24 hours after birth.  Wetting at least 5-6 diapers every 24 hours for the first week after  birth. The urine should be clear or pale yellow by 5 days after birth.  Wetting 6-8 diapers every 24 hours as your baby continues to grow and develop.  At least 3 stools in a 24-hour period by age 5 days. The stool should be soft and yellow.  At least 3 stools in a 24-hour period by age 7 days. The stool should be seedy and yellow.  No loss of weight greater than 10% of birth weight during the first 3 days of age.  Average weight gain of 4-7 ounces (113-198 g) per week after age 4 days.  Consistent daily weight gain by age 5 days, without weight loss after the age of 2 weeks. After a feeding, your baby may spit up a small amount. This is common. Breastfeeding frequency and duration Frequent feeding will help you make more milk and can prevent sore nipples and breast engorgement. Breastfeed when you feel the need to reduce the fullness of your breasts or when your baby shows signs of hunger. This is called "breastfeeding on demand." Avoid introducing a pacifier to your baby while you are working to establish breastfeeding (the first 4-6 weeks after your baby is born). After this time you may choose to use a pacifier. Research has shown that pacifier use during the first year of a baby's life decreases the risk of sudden infant death syndrome (SIDS). Allow your baby to feed on each breast as long as he or she wants. Breastfeed until your baby is finished feeding. When your baby unlatches or falls asleep while feeding from the first breast, offer the second breast. Because newborns are often sleepy in the first few weeks of life, you may need to awaken your baby to get him or her to feed. Breastfeeding times will vary from baby to baby. However, the following rules can serve as a guide to help you ensure that your baby is properly fed:  Newborns (babies 4 weeks of age or younger) may breastfeed every 1-3 hours.  Newborns should not go longer than 3 hours during the day or 5 hours during the night  without breastfeeding.  You should breastfeed your baby a minimum of 8 times in a 24-hour period until you begin to introduce solid foods to your baby at around 6 months of age. Breast milk pumping Pumping and storing breast milk allows you to ensure that your baby is exclusively fed your breast milk, even at times when you are unable to breastfeed. This is especially important if you are going back to work while you are still breastfeeding or when you are not able to be present during feedings. Your lactation consultant can give you guidelines on how long it is safe to store breast milk. A breast pump is a machine that allows you to pump milk from your breast into a sterile bottle. The pumped breast milk can then be stored in a refrigerator or freezer. Some breast pumps are operated by hand, while others use electricity. Ask your lactation consultant which type will work best for you. Breast pumps can be purchased, but some hospitals and breastfeeding support groups lease breast pumps on a monthly basis. A lactation consultant can teach you how to   hand express breast milk, if you prefer not to use a pump. Caring for your breasts while you breastfeed Nipples can become dry, cracked, and sore while breastfeeding. The following recommendations can help keep your breasts moisturized and healthy:  Avoid using soap on your nipples.  Wear a supportive bra. Although not required, special nursing bras and tank tops are designed to allow access to your breasts for breastfeeding without taking off your entire bra or top. Avoid wearing underwire-style bras or extremely tight bras.  Air dry your nipples for 3-4minutes after each feeding.  Use only cotton bra pads to absorb leaked breast milk. Leaking of breast milk between feedings is normal.  Use lanolin on your nipples after breastfeeding. Lanolin helps to maintain your skin's normal moisture barrier. If you use pure lanolin, you do not need to wash it off  before feeding your baby again. Pure lanolin is not toxic to your baby. You may also hand express a few drops of breast milk and gently massage that milk into your nipples and allow the milk to air dry. In the first few weeks after giving birth, some women experience extremely full breasts (engorgement). Engorgement can make your breasts feel heavy, warm, and tender to the touch. Engorgement peaks within 3-5 days after you give birth. The following recommendations can help ease engorgement:  Completely empty your breasts while breastfeeding or pumping. You may want to start by applying warm, moist heat (in the shower or with warm water-soaked hand towels) just before feeding or pumping. This increases circulation and helps the milk flow. If your baby does not completely empty your breasts while breastfeeding, pump any extra milk after he or she is finished.  Wear a snug bra (nursing or regular) or tank top for 1-2 days to signal your body to slightly decrease milk production.  Apply ice packs to your breasts, unless this is too uncomfortable for you.  Make sure that your baby is latched on and positioned properly while breastfeeding. If engorgement persists after 48 hours of following these recommendations, contact your health care provider or a lactation consultant. Overall health care recommendations while breastfeeding  Eat healthy foods. Alternate between meals and snacks, eating 3 of each per day. Because what you eat affects your breast milk, some of the foods may make your baby more irritable than usual. Avoid eating these foods if you are sure that they are negatively affecting your baby.  Drink milk, fruit juice, and water to satisfy your thirst (about 10 glasses a day).  Rest often, relax, and continue to take your prenatal vitamins to prevent fatigue, stress, and anemia.  Continue breast self-awareness checks.  Avoid chewing and smoking tobacco. Chemicals from cigarettes that pass  into breast milk and exposure to secondhand smoke may harm your baby.  Avoid alcohol and drug use, including marijuana. Some medicines that may be harmful to your baby can pass through breast milk. It is important to ask your health care provider before taking any medicine, including all over-the-counter and prescription medicine as well as vitamin and herbal supplements. It is possible to become pregnant while breastfeeding. If birth control is desired, ask your health care provider about options that will be safe for your baby. Contact a health care provider if:  You feel like you want to stop breastfeeding or have become frustrated with breastfeeding.  You have painful breasts or nipples.  Your nipples are cracked or bleeding.  Your breasts are red, tender, or warm.  You have   a swollen area on either breast.  You have a fever or chills.  You have nausea or vomiting.  You have drainage other than breast milk from your nipples.  Your breasts do not become full before feedings by the fifth day after you give birth.  You feel sad and depressed.  Your baby is too sleepy to eat well.  Your baby is having trouble sleeping.  Your baby is wetting less than 3 diapers in a 24-hour period.  Your baby has less than 3 stools in a 24-hour period.  Your baby's skin or the white part of his or her eyes becomes yellow.  Your baby is not gaining weight by 5 days of age. Get help right away if:  Your baby is overly tired (lethargic) and does not want to wake up and feed.  Your baby develops an unexplained fever. This information is not intended to replace advice given to you by your health care provider. Make sure you discuss any questions you have with your health care provider. Document Released: 05/09/2005 Document Revised: 10/21/2015 Document Reviewed: 10/31/2012 Elsevier Interactive Patient Education  2017 [employer]vier Inc.  

## 2016-05-26 ENCOUNTER — Telehealth: Payer: Self-pay | Admitting: *Deleted

## 2016-05-26 LAB — GC/CHLAMYDIA PROBE AMP (~~LOC~~) NOT AT ARMC
Chlamydia: NEGATIVE
Neisseria Gonorrhea: NEGATIVE

## 2016-05-26 NOTE — Telephone Encounter (Signed)
Pt's mother called in stating pt had contractions every 28 min and increased back pain. She stated pt went to use bathroom and "peeing continued". Advised pt to report to MAU for eval. She expressed understanding.

## 2016-05-27 LAB — OB RESULTS CONSOLE GBS: GBS: NEGATIVE

## 2016-05-27 LAB — CULTURE, BETA STREP (GROUP B ONLY)

## 2016-05-30 ENCOUNTER — Ambulatory Visit (INDEPENDENT_AMBULATORY_CARE_PROVIDER_SITE_OTHER): Payer: Medicaid Other | Admitting: Obstetrics & Gynecology

## 2016-05-30 VITALS — BP 120/76 | HR 103 | Wt 252.0 lb

## 2016-05-30 DIAGNOSIS — O3663X1 Maternal care for excessive fetal growth, third trimester, fetus 1: Secondary | ICD-10-CM

## 2016-05-30 DIAGNOSIS — O3663X0 Maternal care for excessive fetal growth, third trimester, not applicable or unspecified: Secondary | ICD-10-CM

## 2016-05-30 DIAGNOSIS — Z3483 Encounter for supervision of other normal pregnancy, third trimester: Secondary | ICD-10-CM

## 2016-05-30 DIAGNOSIS — Z3480 Encounter for supervision of other normal pregnancy, unspecified trimester: Secondary | ICD-10-CM

## 2016-05-30 NOTE — Addendum Note (Signed)
Addended by: Gretchen Short on: 05/30/2016 03:01 PM   Modules accepted: Orders

## 2016-05-30 NOTE — Patient Instructions (Signed)
Return to clinic for any scheduled appointments or obstetric concerns, or go to MAU for evaluation  

## 2016-05-30 NOTE — Progress Notes (Signed)
   PRENATAL VISIT NOTE  Subjective:  Sherry Spence is a 26 y.o. G2P1001 at [redacted]w[redacted]d being seen today for ongoing prenatal care.  She is currently monitored for the following issues for this low-risk pregnancy and has Supervision of normal pregnancy, antepartum; BMI 40.0-44.9, adult ([place]); Obesity in pregnancy; Nausea and vomiting during pregnancy; and Late prenatal care on her problem list.  Patient reports no complaints.  Contractions: Irregular. Vag. Bleeding: None.  Movement: Present. Denies leaking of fluid.   The following portions of the patient's history were reviewed and updated as appropriate: allergies, current medications, past family history, past medical history, past social history, past surgical history and problem list. Problem list updated.  Objective:   Vitals:   05/30/16 1421  BP: 120/76  Pulse: (!) 103  Weight: 252 lb (114.3 kg)    Fetal Status: Fetal Heart Rate (bpm): 145 Fundal Height: 40 cm Movement: Present  Presentation: Vertex  General:  Alert, oriented and cooperative. Patient is in no acute distress.  Skin: Skin is warm and dry. No rash noted.   Cardiovascular: Normal heart rate noted  Respiratory: Normal respiratory effort, no problems with respiration noted  Abdomen: Soft, gravid, appropriate for gestational age. Pain/Pressure: Present     Pelvic:  Cervical exam performed Dilation: 2.5 Effacement (%): 50 Station: -3  Extremities: Normal range of motion.  Edema: Trace  Mental Status: Normal mood and affect. Normal behavior. Normal judgment and thought content.   Assessment and Plan:  Pregnancy: G2P1001 at [redacted]w[redacted]d  1. Large for dates affecting management of mother, third trimester, fetus 71 - [place] MFM OB FOLLOW UP; Future.  Will follow up results and manage accordingly.  2. Supervision of other normal pregnancy, antepartum Labor symptoms and general obstetric precautions including but not limited to vaginal bleeding, contractions, leaking of fluid and  fetal movement were reviewed in detail with the patient. Please refer to After Visit Summary for other counseling recommendations.  Return in about 1 week (around 06/06/2016) for OB Visit.   Osborne Oman, MD

## 2016-06-02 ENCOUNTER — Inpatient Hospital Stay (HOSPITAL_COMMUNITY)
Admission: AD | Admit: 2016-06-02 | Discharge: 2016-06-02 | Disposition: A | Discharge status: Home / Self Care | Payer: Medicaid Other | Source: Ambulatory Visit | Attending: Obstetrics and Gynecology | Admitting: Obstetrics and Gynecology

## 2016-06-02 ENCOUNTER — Telehealth: Payer: Self-pay | Admitting: *Deleted

## 2016-06-02 ENCOUNTER — Encounter (HOSPITAL_COMMUNITY): Payer: Self-pay | Admitting: *Deleted

## 2016-06-02 DIAGNOSIS — O4703 False labor before 37 completed weeks of gestation, third trimester: Secondary | ICD-10-CM

## 2016-06-02 DIAGNOSIS — Z3400 Encounter for supervision of normal first pregnancy, unspecified trimester: Secondary | ICD-10-CM

## 2016-06-02 DIAGNOSIS — O471 False labor at or after 37 completed weeks of gestation: Secondary | ICD-10-CM | POA: Diagnosis present

## 2016-06-02 DIAGNOSIS — Z3A37 37 weeks gestation of pregnancy: Secondary | ICD-10-CM | POA: Diagnosis not present

## 2016-06-02 NOTE — MAU Note (Signed)
C/o ucs since last night around 2300; denies SROM or vaginal bleeding;

## 2016-06-02 NOTE — Telephone Encounter (Signed)
-----   Message from [place] sent at 06/02/2016  9:13 AM EST ----- Regarding: OB Pains Contact: [phone] Wants to talk to you about some pains she is having, advised her (in the meantime) to increase h2o and lay down and start timing these pains and note intensity

## 2016-06-02 NOTE — MAU Provider Note (Signed)
Sherry Spence is a 26 y.o. female G2P1 at [redacted]w[redacted]d presenting for evaluation of regular contractions. Patient reports onset of contractions yesterday evening. She states they increased in frequency and intensity throughout the day currently being 6-10 minutes apart. Patient is breathing through each contractions. Patient with uncomplicated PNC at [place].   OB History    Gravida Para Term Preterm AB Living   2 1 1     1    SAB TAB Ectopic Multiple Live Births           1     Past Medical History:  Diagnosis Date  . Cholelithiasis   . Headache   . History of chlamydia infection    Past Surgical History:  Procedure Laterality Date  . NO PAST SURGERIES     Family History: family history includes Cancer in her maternal grandmother; Diabetes in her maternal grandmother, paternal aunt, and paternal grandmother; Hypertension in her father and paternal grandmother. Social History:  reports that she has never smoked. She has never used smokeless tobacco. She reports that she does not drink alcohol or use drugs.     Maternal Diabetes: No Genetic Screening: Normal Maternal Ultrasounds/Referrals: Normal Fetal Ultrasounds or other Referrals:  None Maternal Substance Abuse:  No Significant Maternal Medications:  None Significant Maternal Lab Results:  None Other Comments:  None  ROS  See pertinent in HPI History Dilation: 2 Exam by:: Sherry Spence Blood pressure 114/71, pulse 100, temperature 97.7 F (36.5 C), temperature source Oral, resp. rate 18, last menstrual period 09/15/2015. Exam Physical Exam  GENERAL: Well-developed, well-nourished female in no acute distress.  ABDOMEN: Soft, nontender, gravid PELVIC: see above cervical exam which remained unchanged following 1 hour of monitoring 2/50/-3 EXTREMITIES: No cyanosis, clubbing, or edema, 2+ distal pulses.  FHT: baseline 120, mod variability, + accels, no decels Toco: irregular contractions with irritability  Prenatal  labs: ABO, Rh: --/--/O POS (08/07 0930) Antibody: Negative (08/07 0000) Rubella:   RPR: NON REAC (10/27 1030)  HBsAg: Negative (08/07 0000)  HIV: NONREACTIVE (10/27 1030)  GBS:   negative  Assessment/Plan: 26 yo G2P1 at [redacted]w[redacted]d in latent labor - Labor precautions reviewed with the patient - Advised patient to keep ultrasound to assess fetal growth (size greater than dates) which is scheduled for tomorrow  - Follow up as scheduled for routine prenatal care  Sherry Spence 06/02/2016, 3:37 PM

## 2016-06-02 NOTE — Telephone Encounter (Signed)
Pt is currently [redacted]w[redacted]d and is c/o discomfort and lower back pain.  Reviewed labor precautions and encouraged to increase water intake and rest.  Instructed to go to MAU for decreased movement, leaking of fluid, bleeding, regular contractions that increase in intensity, or increased pressure.  Pt acknowledged instructions.

## 2016-06-03 ENCOUNTER — Encounter (HOSPITAL_COMMUNITY): Payer: Self-pay

## 2016-06-03 ENCOUNTER — Ambulatory Visit (HOSPITAL_COMMUNITY)
Admission: RE | Admit: 2016-06-03 | Discharge: 2016-06-03 | Disposition: A | Discharge status: Home / Self Care | Payer: Medicaid Other | Source: Ambulatory Visit | Attending: Obstetrics & Gynecology | Admitting: Obstetrics & Gynecology

## 2016-06-03 DIAGNOSIS — O3663X1 Maternal care for excessive fetal growth, third trimester, fetus 1: Secondary | ICD-10-CM

## 2016-06-03 DIAGNOSIS — Z3A37 37 weeks gestation of pregnancy: Secondary | ICD-10-CM | POA: Diagnosis not present

## 2016-06-03 DIAGNOSIS — O3663X0 Maternal care for excessive fetal growth, third trimester, not applicable or unspecified: Secondary | ICD-10-CM | POA: Diagnosis present

## 2016-06-06 ENCOUNTER — Ambulatory Visit (INDEPENDENT_AMBULATORY_CARE_PROVIDER_SITE_OTHER): Payer: Medicaid Other | Admitting: Obstetrics and Gynecology

## 2016-06-06 VITALS — BP 129/85 | HR 97 | Wt 251.0 lb

## 2016-06-06 DIAGNOSIS — Z3400 Encounter for supervision of normal first pregnancy, unspecified trimester: Secondary | ICD-10-CM

## 2016-06-06 DIAGNOSIS — Z3483 Encounter for supervision of other normal pregnancy, third trimester: Secondary | ICD-10-CM

## 2016-06-06 NOTE — Progress Notes (Signed)
Prenatal Visit Note Date: 06/06/2016 Clinic: [facility]  Subjective:  Sherry Spence is a 26 y.o. G2P1001 at [redacted]w[redacted]d being seen today for ongoing prenatal care.  She is currently monitored for the following issues for this low-risk pregnancy and has Supervision of normal pregnancy, antepartum; BMI 40.0-44.9, adult ([place]); Obesity in pregnancy; Nausea and vomiting during pregnancy; and Late prenatal care on her problem list.  Patient reports no complaints.   Contractions: Irregular. Vag. Bleeding: None.  Movement: Present. Denies leaking of fluid.   The following portions of the patient's history were reviewed and updated as appropriate: allergies, current medications, past family history, past medical history, past social history, past surgical history and problem list. Problem list updated.  Objective:   Vitals:   06/06/16 1419  BP: 129/85  Pulse: 97  Weight: 251 lb (113.9 kg)    Fetal Status: Fetal Heart Rate (bpm): 140   Movement: Present  Presentation: Vertex  General:  Alert, oriented and cooperative. Patient is in no acute distress.  Skin: Skin is warm and dry. No rash noted.   Cardiovascular: Normal heart rate noted  Respiratory: Normal respiratory effort, no problems with respiration noted  Abdomen: Soft, gravid, appropriate for gestational age. Pain/Pressure: Present     Pelvic:  Cervical exam performed Dilation: 2.5 Effacement (%): 50 Station: -3  Extremities: Normal range of motion.  Edema: None  Mental Status: Normal mood and affect. Normal behavior. Normal judgment and thought content.   Urinalysis:      Assessment and Plan:  Pregnancy: G2P1001 at [redacted]w[redacted]d  1. Supervision of normal first pregnancy, antepartum Routine care. Thinking about BTL but likely nuva ring. D/w pt re: permanency of BTL and period control with other methods. LGA growth u/s reviewed with patient and normal AFI and EFW @ 88% and large AC. Negative GDM screening. Prior 3300gm  infant with no issues.   Term labor symptoms and general obstetric precautions including but not limited to vaginal bleeding, contractions, leaking of fluid and fetal movement were reviewed in detail with the patient. Please refer to After Visit Summary for other counseling recommendations.  Return in about 1 week (around 06/13/2016) for 7-10d rob.   Aletha Halim, MD

## 2016-06-13 ENCOUNTER — Ambulatory Visit (INDEPENDENT_AMBULATORY_CARE_PROVIDER_SITE_OTHER): Payer: Medicaid Other | Admitting: Family Medicine

## 2016-06-13 VITALS — BP 115/80 | HR 102 | Wt 248.0 lb

## 2016-06-13 DIAGNOSIS — Z3403 Encounter for supervision of normal first pregnancy, third trimester: Secondary | ICD-10-CM

## 2016-06-13 DIAGNOSIS — Z3400 Encounter for supervision of normal first pregnancy, unspecified trimester: Secondary | ICD-10-CM

## 2016-06-13 NOTE — Patient Instructions (Signed)
Third Trimester of Pregnancy The third trimester is from week 29 through week 40 (months 7 through 9). The third trimester is a time when the unborn baby (fetus) is growing rapidly. At the end of the ninth month, the fetus is about 20 inches in length and weighs 6-10 pounds. Body changes during your third trimester Your body goes through many changes during pregnancy. The changes vary from woman to woman. During the third trimester:  Your weight will continue to increase. You can expect to gain 25-35 pounds (11-16 kg) by the end of the pregnancy.  You may begin to get stretch marks on your hips, abdomen, and breasts.  You may urinate more often because the fetus is moving lower into your pelvis and pressing on your bladder.  You may develop or continue to have heartburn. This is caused by increased hormones that slow down muscles in the digestive tract.  You may develop or continue to have constipation because increased hormones slow digestion and cause the muscles that push waste through your intestines to relax.  You may develop hemorrhoids. These are swollen veins (varicose veins) in the rectum that can itch or be painful.  You may develop swollen, bulging veins (varicose veins) in your legs.  You may have increased body aches in the pelvis, back, or thighs. This is due to weight gain and increased hormones that are relaxing your joints.  You may have changes in your hair. These can include thickening of your hair, rapid growth, and changes in texture. Some women also have hair loss during or after pregnancy, or hair that feels dry or thin. Your hair will most likely return to normal after your baby is born.  Your breasts will continue to grow and they will continue to become tender. A yellow fluid (colostrum) may leak from your breasts. This is the first milk you are producing for your baby.  Your belly button may stick out.  You may notice more swelling in your hands, face, or  ankles.  You may have increased tingling or numbness in your hands, arms, and legs. The skin on your belly may also feel numb.  You may feel short of breath because of your expanding uterus.  You may have more problems sleeping. This can be caused by the size of your belly, increased need to urinate, and an increase in your body's metabolism.  You may notice the fetus "dropping," or moving lower in your abdomen.  You may have increased vaginal discharge.  Your cervix becomes thin and soft (effaced) near your due date. What to expect at prenatal visits You will have prenatal exams every 2 weeks until week 36. Then you will have weekly prenatal exams. During a routine prenatal visit:  You will be weighed to make sure you and the fetus are growing normally.  Your blood pressure will be taken.  Your abdomen will be measured to track your baby's growth.  The fetal heartbeat will be listened to.  Any test results from the previous visit will be discussed.  You may have a cervical check near your due date to see if you have effaced. At around 36 weeks, your health care provider will check your cervix. At the same time, your health care provider will also perform a test on the secretions of the vaginal tissue. This test is to determine if a type of bacteria, Group B streptococcus, is present. Your health care provider will explain this further. Your health care provider may ask you:    What your birth plan is.  How you are feeling.  If you are feeling the baby move.  If you have had any abnormal symptoms, such as leaking fluid, bleeding, severe headaches, or abdominal cramping.  If you are using any tobacco products, including cigarettes, chewing tobacco, and electronic cigarettes.  If you have any questions. Other tests or screenings that may be performed during your third trimester include:  Blood tests that check for low iron levels (anemia).  Fetal testing to check the health,  activity level, and growth of the fetus. Testing is done if you have certain medical conditions or if there are problems during the pregnancy.  Nonstress test (NST). This test checks the health of your baby to make sure there are no signs of problems, such as the baby not getting enough oxygen. During this test, a belt is placed around your belly. The baby is made to move, and its heart rate is monitored during movement. What is false labor? False labor is a condition in which you feel small, irregular tightenings of the muscles in the womb (contractions) that eventually go away. These are called Braxton Hicks contractions. Contractions may last for hours, days, or even weeks before true labor sets in. If contractions come at regular intervals, become more frequent, increase in intensity, or become painful, you should see your health care provider. What are the signs of labor?  Abdominal cramps.  Regular contractions that start at 10 minutes apart and become stronger and more frequent with time.  Contractions that start on the top of the uterus and spread down to the lower abdomen and back.  Increased pelvic pressure and dull back pain.  A watery or bloody mucus discharge that comes from the vagina.  Leaking of amniotic fluid. This is also known as your "water breaking." It could be a slow trickle or a gush. Let your doctor know if it has a color or strange odor. If you have any of these signs, call your health care provider right away, even if it is before your due date. Follow these instructions at home: Eating and drinking  Continue to eat regular, healthy meals.  Do not eat:  Raw meat or meat spreads.  Unpasteurized milk or cheese.  Unpasteurized juice.  Store-made salad.  Refrigerated smoked seafood.  Hot dogs or deli meat, unless they are piping hot.  More than 6 ounces of albacore tuna a week.  Shark, swordfish, king mackerel, or tile fish.  Store-made salads.  Raw  sprouts, such as mung bean or alfalfa sprouts.  Take prenatal vitamins as told by your health care provider.  Take 1000 mg of calcium daily as told by your health care provider.  If you develop constipation:  Take over-the-counter or prescription medicines.  Drink enough fluid to keep your urine clear or pale yellow.  Eat foods that are high in fiber, such as fresh fruits and vegetables, whole grains, and beans.  Limit foods that are high in fat and processed sugars, such as fried and sweet foods. Activity  Exercise only as directed by your health care provider. Healthy pregnant women should aim for 2 hours and 30 minutes of moderate exercise per week. If you experience any pain or discomfort while exercising, stop.  Avoid heavy lifting.  Do not exercise in extreme heat or humidity, or at high altitudes.  Wear low-heel, comfortable shoes.  Practice good posture.  Do not travel far distances unless it is absolutely necessary and only with the approval   of your health care provider.  Wear your seat belt at all times while in a car, on a bus, or on a plane.  Take frequent breaks and rest with your legs elevated if you have leg cramps or low back pain.  Do not use hot tubs, steam rooms, or saunas.  You may continue to have sex unless your health care provider tells you otherwise. Lifestyle  Do not use any products that contain nicotine or tobacco, such as cigarettes and e-cigarettes. If you need help quitting, ask your health care provider.  Do not drink alcohol.  Do not use any medicinal herbs or unprescribed drugs. These chemicals affect the formation and growth of the baby.  If you develop varicose veins:  Wear support pantyhose or compression stockings as told by your healthcare provider.  Elevate your feet for 15 minutes, 3-4 times a day.  Wear a supportive maternity bra to help with breast tenderness. General instructions  Take over-the-counter and prescription  medicines only as told by your health care provider. There are medicines that are either safe or unsafe to take during pregnancy.  Take warm sitz baths to soothe any pain or discomfort caused by hemorrhoids. Use hemorrhoid cream or witch hazel if your health care provider approves.  Avoid cat litter boxes and soil used by cats. These carry germs that can cause birth defects in the baby. If you have a cat, ask someone to clean the litter box for you.  To prepare for the arrival of your baby:  Take prenatal classes to understand, practice, and ask questions about the labor and delivery.  Make a trial run to the hospital.  Visit the hospital and tour the maternity area.  Arrange for maternity or paternity leave through employers.  Arrange for family and friends to take care of pets while you are in the hospital.  Purchase a rear-facing car seat and make sure you know how to install it in your car.  Pack your hospital bag.  Prepare the baby's nursery. Make sure to remove all pillows and stuffed animals from the baby's crib to prevent suffocation.  Visit your dentist if you have not gone during your pregnancy. Use a soft toothbrush to brush your teeth and be gentle when you floss.  Keep all prenatal follow-up visits as told by your health care provider. This is important. Contact a health care provider if:  You are unsure if you are in labor or if your water has broken.  You become dizzy.  You have mild pelvic cramps, pelvic pressure, or nagging pain in your abdominal area.  You have lower back pain.  You have persistent nausea, vomiting, or diarrhea.  You have an unusual or bad smelling vaginal discharge.  You have pain when you urinate. Get help right away if:  You have a fever.  You are leaking fluid from your vagina.  You have spotting or bleeding from your vagina.  You have severe abdominal pain or cramping.  You have rapid weight loss or weight gain.  You have  shortness of breath with chest pain.  You notice sudden or extreme swelling of your face, hands, ankles, feet, or legs.  Your baby makes fewer than 10 movements in 2 hours.  You have severe headaches that do not go away with medicine.  You have vision changes. Summary  The third trimester is from week 29 through week 40, months 7 through 9. The third trimester is a time when the unborn baby (fetus)   is growing rapidly.  During the third trimester, your discomfort may increase as you and your baby continue to gain weight. You may have abdominal, leg, and back pain, sleeping problems, and an increased need to urinate.  During the third trimester your breasts will keep growing and they will continue to become tender. A yellow fluid (colostrum) may leak from your breasts. This is the first milk you are producing for your baby.  False labor is a condition in which you feel small, irregular tightenings of the muscles in the womb (contractions) that eventually go away. These are called Braxton Hicks contractions. Contractions may last for hours, days, or even weeks before true labor sets in.  Signs of labor can include: abdominal cramps; regular contractions that start at 10 minutes apart and become stronger and more frequent with time; watery or bloody mucus discharge that comes from the vagina; increased pelvic pressure and dull back pain; and leaking of amniotic fluid. This information is not intended to replace advice given to you by your health care provider. Make sure you discuss any questions you have with your health care provider. Document Released: 05/03/2001 Document Revised: 10/15/2015 Document Reviewed: 07/10/2012 [employer] Interactive Patient Education  2017 [employer]   Breastfeeding Deciding to breastfeed is one of the best choices you can make for you and your baby. A change in hormones during pregnancy causes your breast tissue to grow and increases the number and size of your  milk ducts. These hormones also allow proteins, sugars, and fats from your blood supply to make breast milk in your milk-producing glands. Hormones prevent breast milk from being released before your baby is born as well as prompt milk flow after birth. Once breastfeeding has begun, thoughts of your baby, as well as his or her sucking or crying, can stimulate the release of milk from your milk-producing glands. Benefits of breastfeeding For Your Baby  Your first milk (colostrum) helps your baby's digestive system function better.  There are antibodies in your milk that help your baby fight off infections.  Your baby has a lower incidence of asthma, allergies, and sudden infant death syndrome.  The nutrients in breast milk are better for your baby than infant formulas and are designed uniquely for your baby's needs.  Breast milk improves your baby's brain development.  Your baby is less likely to develop other conditions, such as childhood obesity, asthma, or type 2 diabetes mellitus. For You  Breastfeeding helps to create a very special bond between you and your baby.  Breastfeeding is convenient. Breast milk is always available at the correct temperature and costs nothing.  Breastfeeding helps to burn calories and helps you lose the weight gained during pregnancy.  Breastfeeding makes your uterus contract to its prepregnancy size faster and slows bleeding (lochia) after you give birth.  Breastfeeding helps to lower your risk of developing type 2 diabetes mellitus, osteoporosis, and breast or ovarian cancer later in life. Signs that your baby is hungry Early Signs of Hunger  Increased alertness or activity.  Stretching.  Movement of the head from side to side.  Movement of the head and opening of the mouth when the corner of the mouth or cheek is stroked (rooting).  Increased sucking sounds, smacking lips, cooing, sighing, or squeaking.  Hand-to-mouth movements.  Increased  sucking of fingers or hands. Late Signs of Hunger  Fussing.  Intermittent crying. Extreme Signs of Hunger  Signs of extreme hunger will require calming and consoling before your baby will   be able to breastfeed successfully. Do not wait for the following signs of extreme hunger to occur before you initiate breastfeeding:  Restlessness.  A loud, strong cry.  Screaming. Breastfeeding basics  Breastfeeding Initiation  Find a comfortable place to sit or lie down, with your neck and back well supported.  Place a pillow or rolled up blanket under your baby to bring him or her to the level of your breast (if you are seated). Nursing pillows are specially designed to help support your arms and your baby while you breastfeed.  Make sure that your baby's abdomen is facing your abdomen.  Gently massage your breast. With your fingertips, massage from your chest wall toward your nipple in a circular motion. This encourages milk flow. You may need to continue this action during the feeding if your milk flows slowly.  Support your breast with 4 fingers underneath and your thumb above your nipple. Make sure your fingers are well away from your nipple and your baby's mouth.  Stroke your baby's lips gently with your finger or nipple.  When your baby's mouth is open wide enough, quickly bring your baby to your breast, placing your entire nipple and as much of the colored area around your nipple (areola) as possible into your baby's mouth.  More areola should be visible above your baby's upper lip than below the lower lip.  Your baby's tongue should be between his or her lower gum and your breast.  Ensure that your baby's mouth is correctly positioned around your nipple (latched). Your baby's lips should create a seal on your breast and be turned out (everted).  It is common for your baby to suck about 2-3 minutes in order to start the flow of breast milk. Latching  Teaching your baby how to latch  on to your breast properly is very important. An improper latch can cause nipple pain and decreased milk supply for you and poor weight gain in your baby. Also, if your baby is not latched onto your nipple properly, he or she may swallow some air during feeding. This can make your baby fussy. Burping your baby when you switch breasts during the feeding can help to get rid of the air. However, teaching your baby to latch on properly is still the best way to prevent fussiness from swallowing air while breastfeeding. Signs that your baby has successfully latched on to your nipple:  Silent tugging or silent sucking, without causing you pain.  Swallowing heard between every 3-4 sucks.  Muscle movement above and in front of his or her ears while sucking. Signs that your baby has not successfully latched on to nipple:  Sucking sounds or smacking sounds from your baby while breastfeeding.  Nipple pain. If you think your baby has not latched on correctly, slip your finger into the corner of your baby's mouth to break the suction and place it between your baby's gums. Attempt breastfeeding initiation again. Signs of Successful Breastfeeding  Signs from your baby:  A gradual decrease in the number of sucks or complete cessation of sucking.  Falling asleep.  Relaxation of his or her body.  Retention of a small amount of milk in his or her mouth.  Letting go of your breast by himself or herself. Signs from you:  Breasts that have increased in firmness, weight, and size 1-3 hours after feeding.  Breasts that are softer immediately after breastfeeding.  Increased milk volume, as well as a change in milk consistency and color by   the fifth day of breastfeeding.  Nipples that are not sore, cracked, or bleeding. Signs That Your Baby is Getting Enough Milk  Wetting at least 1-2 diapers during the first 24 hours after birth.  Wetting at least 5-6 diapers every 24 hours for the first week after  birth. The urine should be clear or pale yellow by 5 days after birth.  Wetting 6-8 diapers every 24 hours as your baby continues to grow and develop.  At least 3 stools in a 24-hour period by age 5 days. The stool should be soft and yellow.  At least 3 stools in a 24-hour period by age 7 days. The stool should be seedy and yellow.  No loss of weight greater than 10% of birth weight during the first 3 days of age.  Average weight gain of 4-7 ounces (113-198 g) per week after age 4 days.  Consistent daily weight gain by age 5 days, without weight loss after the age of 2 weeks. After a feeding, your baby may spit up a small amount. This is common. Breastfeeding frequency and duration Frequent feeding will help you make more milk and can prevent sore nipples and breast engorgement. Breastfeed when you feel the need to reduce the fullness of your breasts or when your baby shows signs of hunger. This is called "breastfeeding on demand." Avoid introducing a pacifier to your baby while you are working to establish breastfeeding (the first 4-6 weeks after your baby is born). After this time you may choose to use a pacifier. Research has shown that pacifier use during the first year of a baby's life decreases the risk of sudden infant death syndrome (SIDS). Allow your baby to feed on each breast as long as he or she wants. Breastfeed until your baby is finished feeding. When your baby unlatches or falls asleep while feeding from the first breast, offer the second breast. Because newborns are often sleepy in the first few weeks of life, you may need to awaken your baby to get him or her to feed. Breastfeeding times will vary from baby to baby. However, the following rules can serve as a guide to help you ensure that your baby is properly fed:  Newborns (babies 4 weeks of age or younger) may breastfeed every 1-3 hours.  Newborns should not go longer than 3 hours during the day or 5 hours during the night  without breastfeeding.  You should breastfeed your baby a minimum of 8 times in a 24-hour period until you begin to introduce solid foods to your baby at around 6 months of age. Breast milk pumping Pumping and storing breast milk allows you to ensure that your baby is exclusively fed your breast milk, even at times when you are unable to breastfeed. This is especially important if you are going back to work while you are still breastfeeding or when you are not able to be present during feedings. Your lactation consultant can give you guidelines on how long it is safe to store breast milk. A breast pump is a machine that allows you to pump milk from your breast into a sterile bottle. The pumped breast milk can then be stored in a refrigerator or freezer. Some breast pumps are operated by hand, while others use electricity. Ask your lactation consultant which type will work best for you. Breast pumps can be purchased, but some hospitals and breastfeeding support groups lease breast pumps on a monthly basis. A lactation consultant can teach you how to   hand express breast milk, if you prefer not to use a pump. Caring for your breasts while you breastfeed Nipples can become dry, cracked, and sore while breastfeeding. The following recommendations can help keep your breasts moisturized and healthy:  Avoid using soap on your nipples.  Wear a supportive bra. Although not required, special nursing bras and tank tops are designed to allow access to your breasts for breastfeeding without taking off your entire bra or top. Avoid wearing underwire-style bras or extremely tight bras.  Air dry your nipples for 3-4minutes after each feeding.  Use only cotton bra pads to absorb leaked breast milk. Leaking of breast milk between feedings is normal.  Use lanolin on your nipples after breastfeeding. Lanolin helps to maintain your skin's normal moisture barrier. If you use pure lanolin, you do not need to wash it off  before feeding your baby again. Pure lanolin is not toxic to your baby. You may also hand express a few drops of breast milk and gently massage that milk into your nipples and allow the milk to air dry. In the first few weeks after giving birth, some women experience extremely full breasts (engorgement). Engorgement can make your breasts feel heavy, warm, and tender to the touch. Engorgement peaks within 3-5 days after you give birth. The following recommendations can help ease engorgement:  Completely empty your breasts while breastfeeding or pumping. You may want to start by applying warm, moist heat (in the shower or with warm water-soaked hand towels) just before feeding or pumping. This increases circulation and helps the milk flow. If your baby does not completely empty your breasts while breastfeeding, pump any extra milk after he or she is finished.  Wear a snug bra (nursing or regular) or tank top for 1-2 days to signal your body to slightly decrease milk production.  Apply ice packs to your breasts, unless this is too uncomfortable for you.  Make sure that your baby is latched on and positioned properly while breastfeeding. If engorgement persists after 48 hours of following these recommendations, contact your health care provider or a lactation consultant. Overall health care recommendations while breastfeeding  Eat healthy foods. Alternate between meals and snacks, eating 3 of each per day. Because what you eat affects your breast milk, some of the foods may make your baby more irritable than usual. Avoid eating these foods if you are sure that they are negatively affecting your baby.  Drink milk, fruit juice, and water to satisfy your thirst (about 10 glasses a day).  Rest often, relax, and continue to take your prenatal vitamins to prevent fatigue, stress, and anemia.  Continue breast self-awareness checks.  Avoid chewing and smoking tobacco. Chemicals from cigarettes that pass  into breast milk and exposure to secondhand smoke may harm your baby.  Avoid alcohol and drug use, including marijuana. Some medicines that may be harmful to your baby can pass through breast milk. It is important to ask your health care provider before taking any medicine, including all over-the-counter and prescription medicine as well as vitamin and herbal supplements. It is possible to become pregnant while breastfeeding. If birth control is desired, ask your health care provider about options that will be safe for your baby. Contact a health care provider if:  You feel like you want to stop breastfeeding or have become frustrated with breastfeeding.  You have painful breasts or nipples.  Your nipples are cracked or bleeding.  Your breasts are red, tender, or warm.  You have   a swollen area on either breast.  You have a fever or chills.  You have nausea or vomiting.  You have drainage other than breast milk from your nipples.  Your breasts do not become full before feedings by the fifth day after you give birth.  You feel sad and depressed.  Your baby is too sleepy to eat well.  Your baby is having trouble sleeping.  Your baby is wetting less than 3 diapers in a 24-hour period.  Your baby has less than 3 stools in a 24-hour period.  Your baby's skin or the white part of his or her eyes becomes yellow.  Your baby is not gaining weight by 5 days of age. Get help right away if:  Your baby is overly tired (lethargic) and does not want to wake up and feed.  Your baby develops an unexplained fever. This information is not intended to replace advice given to you by your health care provider. Make sure you discuss any questions you have with your health care provider. Document Released: 05/09/2005 Document Revised: 10/21/2015 Document Reviewed: 10/31/2012 Elsevier Interactive Patient Education  2017 [employer]vier Inc.  

## 2016-06-14 NOTE — Progress Notes (Signed)
   PRENATAL VISIT NOTE  Subjective:  Sherry Spence is a 26 y.o. G2P1001 at [redacted]w[redacted]d being seen today for ongoing prenatal care.  She is currently monitored for the following issues for this low-risk pregnancy and has Supervision of normal pregnancy, antepartum; BMI 40.0-44.9, adult ([place]); Obesity in pregnancy; Nausea and vomiting during pregnancy; and Late prenatal care on her problem list.  Patient reports backache and contractions since yesterday, irregular.  Contractions: Irregular. Vag. Bleeding: None.  Movement: Present. Denies leaking of fluid.   The following portions of the patient's history were reviewed and updated as appropriate: allergies, current medications, past family history, past medical history, past social history, past surgical history and problem list. Problem list updated.  Objective:   Vitals:   06/13/16 1428  BP: 115/80  Pulse: (!) 102  Weight: 248 lb (112.5 kg)    Fetal Status: Fetal Heart Rate (bpm): 143 Fundal Height: 39 cm Movement: Present     General:  Alert, oriented and cooperative. Patient is in no acute distress.  Skin: Skin is warm and dry. No rash noted.   Cardiovascular: Normal heart rate noted  Respiratory: Normal respiratory effort, no problems with respiration noted  Abdomen: Soft, gravid, appropriate for gestational age. Pain/Pressure: Present     Pelvic:  Cervical exam performed Dilation: 2 Effacement (%): 70 Station: -3  Extremities: Normal range of motion.  Edema: Trace  Mental Status: Normal mood and affect. Normal behavior. Normal judgment and thought content.   Assessment and Plan:  Pregnancy: G2P1001 at [redacted]w[redacted]d  1. Supervision of normal first pregnancy, antepartum Labor precautions reviewed  Term labor symptoms and general obstetric precautions including but not limited to vaginal bleeding, contractions, leaking of fluid and fetal movement were reviewed in detail with the patient. Please refer to After Visit Summary for other  counseling recommendations.  Return in 1 week (on 06/20/2016).   Donnamae Jude, MD

## 2016-06-17 ENCOUNTER — Encounter (HOSPITAL_COMMUNITY): Payer: Self-pay

## 2016-06-17 ENCOUNTER — Inpatient Hospital Stay (HOSPITAL_COMMUNITY)
Admission: AD | Admit: 2016-06-17 | Discharge: 2016-06-17 | Disposition: A | Discharge status: Home / Self Care | Payer: Medicaid Other | Source: Ambulatory Visit | Attending: Obstetrics & Gynecology | Admitting: Obstetrics & Gynecology

## 2016-06-17 DIAGNOSIS — Z3483 Encounter for supervision of other normal pregnancy, third trimester: Secondary | ICD-10-CM

## 2016-06-17 DIAGNOSIS — Z3A39 39 weeks gestation of pregnancy: Secondary | ICD-10-CM

## 2016-06-17 NOTE — MAU Note (Signed)
Notified Dr. Vanetta Shawl patient G2P1 [redacted]w[redacted]d observing for one hour and rechecking her cervix, very uncomfortable, will let MD know of next cervical exam.

## 2016-06-17 NOTE — MAU Note (Signed)
Notified Dr. Megan Salon cervix unchanged, patient desires to walk so have let her walk, her mother is requested an MD come to see her daughter, RN will call when patient returns.

## 2016-06-17 NOTE — MAU Note (Signed)
Patient presents with contractions since 0300

## 2016-06-17 NOTE — MAU Provider Note (Signed)
Patient requested to be seen by a physician for labor check after initial check by RN Malachy Mood. I examined the patient, found to be 2cm/60/ballotable. Patient looked comfortable in the room, irregular contractions (difficult to pick up on monitor 2/2 body habitus and laying on her side). Fetal heart tracing is REACTIVE. Discharge home and return precautions given for labor, ROM, and decreased fetal movement.  Zenda Alpers, DO  [facility] Fellow [facility], [facility]

## 2016-06-17 NOTE — Discharge Instructions (Signed)
Introduction Patient Name: ________________________________________________ Patient Due Date: ____________________ What is a fetal movement count? A fetal movement count is the number of times that you feel your baby move during a certain amount of time. This may also be called a fetal kick count. A fetal movement count is recommended for every pregnant woman. You may be asked to start counting fetal movements as early as week 28 of your pregnancy. Pay attention to when your baby is most active. You may notice your baby's sleep and wake cycles. You may also notice things that make your baby move more. You should do a fetal movement count:  When your baby is normally most active.  At the same time each day. A good time to count movements is while you are resting, after having something to eat and drink. How do I count fetal movements? 1. Find a quiet, comfortable area. Sit, or lie down on your side. 2. Write down the date, the start time and stop time, and the number of movements that you felt between those two times. Take this information with you to your health care visits. 3. For 2 hours, count kicks, flutters, swishes, rolls, and jabs. You should feel at least 10 movements during 2 hours. 4. You may stop counting after you have felt 10 movements. 5. If you do not feel 10 movements in 2 hours, have something to eat and drink. Then, keep resting and counting for 1 hour. If you feel at least 4 movements during that hour, you may stop counting. Contact a health care provider if:  You feel fewer than 4 movements in 2 hours.  Your baby is not moving like he or she usually does. Date: ____________ Start time: ____________ Stop time: ____________ Movements: ____________ Date: ____________ Start time: ____________ Stop time: ____________ Movements: ____________ Date: ____________ Start time: ____________ Stop time: ____________ Movements: ____________ Date: ____________ Start time: ____________  Stop time: ____________ Movements: ____________ Date: ____________ Start time: ____________ Stop time: ____________ Movements: ____________ Date: ____________ Start time: ____________ Stop time: ____________ Movements: ____________ Date: ____________ Start time: ____________ Stop time: ____________ Movements: ____________ Date: ____________ Start time: ____________ Stop time: ____________ Movements: ____________ Date: ____________ Start time: ____________ Stop time: ____________ Movements: ____________ This information is not intended to replace advice given to you by your health care provider. Make sure you discuss any questions you have with your health care provider. Document Released: 06/08/2006 Document Revised: 01/06/2016 Document Reviewed: 06/18/2015 Elsevier Interactive Patient Education  2017 [employer]. Vaginal Delivery Vaginal delivery means that you will give birth by pushing your baby out of your birth canal (vagina). A team of health care providers will help you before, during, and after vaginal delivery. Birth experiences are unique for every woman and every pregnancy, and birth experiences vary depending on where you choose to give birth. What should I do to prepare for my baby's birth? Before your baby is born, it is important to talk with your health care provider about:  Your labor and delivery preferences. These may include:  Medicines that you may be given.  How you will manage your pain. This might include non-medical pain relief techniques or injectable pain relief such as epidural analgesia.  How you and your baby will be monitored during labor and delivery.  Who may be in the labor and delivery room with you.  Your feelings about surgical delivery of your baby (cesarean delivery, or C-section) if this becomes necessary.  Your feelings about receiving donated blood through an  IV tube (blood transfusion) if this becomes necessary.  Whether you are able:  To  take pictures or videos of the birth.  To eat during labor and delivery.  To move around, walk, or change positions during labor and delivery.  What to expect after your baby is born, such as:  Whether delayed umbilical cord clamping and cutting is offered.  Who will care for your baby right after birth.  Medicines or tests that may be recommended for your baby.  Whether breastfeeding is supported in your hospital or birth center.  How long you will be in the hospital or birth center.  How any medical conditions you have may affect your baby or your labor and delivery experience. To prepare for your baby's birth, you should also:  Attend all of your health care visits before delivery (prenatal visits) as recommended by your health care provider. This is important.  Prepare your home for your baby's arrival. Make sure that you have:  Diapers.  Baby clothing.  Feeding equipment.  Safe sleeping arrangements for you and your baby.  Install a car seat in your vehicle. Have your car seat checked by a certified car seat installer to make sure that it is installed safely.  Think about who will help you with your new baby at home for at least the first several weeks after delivery. What can I expect when I arrive at the birth center or hospital? Once you are in labor and have been admitted into the hospital or birth center, your health care provider may:  Review your pregnancy history and any concerns you have.  Insert an IV tube into one of your veins. This is used to give you fluids and medicines.  Check your blood pressure, pulse, temperature, and heart rate (vital signs).  Check whether your bag of water (amniotic sac) has broken (ruptured).  Talk with you about your birth plan and discuss pain control options. Monitoring Your health care provider may monitor your contractions (uterine monitoring) and your baby's heart rate (fetal monitoring). You may need to be  monitored:  Often, but not continuously (intermittently).  All the time or for long periods at a time (continuously). Continuous monitoring may be needed if:  You are taking certain medicines, such as medicine to relieve pain or make your contractions stronger.  You have pregnancy or labor complications. Monitoring may be done by:  Placing a special stethoscope or a handheld monitoring device on your abdomen to check your baby's heartbeat, and feeling your abdomen for contractions. This method of monitoring does not continuously record your baby's heartbeat or your contractions.  Placing monitors on your abdomen (external monitors) to record your baby's heartbeat and the frequency and length of contractions. You may not have to wear external monitors all the time.  Placing monitors inside of your uterus (internal monitors) to record your baby's heartbeat and the frequency, length, and strength of your contractions.  Your health care provider may use internal monitors if he or she needs more information about the strength of your contractions or your baby's heart rate.  Internal monitors are put in place by passing a thin, flexible wire through your vagina and into your uterus. Depending on the type of monitor, it may remain in your uterus or on your baby's head until birth.  Your health care provider will discuss the benefits and risks of internal monitoring with you and will ask for your permission before inserting the monitors.  Telemetry. This is a  type of continuous monitoring that can be done with external or internal monitors. Instead of having to stay in bed, you are able to move around during telemetry. Ask your health care provider if telemetry is an option for you. Physical exam Your health care provider may perform a physical exam. This may include:  Checking whether your baby is positioned:  With the head toward your vagina (head-down). This is most common.  With the head  toward the top of your uterus (head-up or breech). If your baby is in a breech position, your health care provider may try to turn your baby to a head-down position so you can deliver vaginally. If it does not seem that your baby can be born vaginally, your provider may recommend surgery to deliver your baby. In rare cases, you may be able to deliver vaginally if your baby is head-up (breech delivery).  Lying sideways (transverse). Babies that are lying sideways cannot be delivered vaginally.  Checking your cervix to determine:  Whether it is thinning out (effacing).  Whether it is opening up (dilating).  How low your baby has moved into your birth canal. What are the three stages of labor and delivery?   Normal labor and delivery is divided into the following three stages: Stage 1  Stage 1 is the longest stage of labor, and it can last for hours or days. Stage 1 includes:  Early labor. This is when contractions may be irregular, or regular and mild. Generally, early labor contractions are more than 10 minutes apart.  Active labor. This is when contractions get longer, more regular, more frequent, and more intense.  The transition phase. This is when contractions happen very close together, are very intense, and may last longer than during any other part of labor.  Contractions generally feel mild, infrequent, and irregular at first. They get stronger, more frequent (about every 2-3 minutes), and more regular as you progress from early labor through active labor and transition.  Many women progress through stage 1 naturally, but you may need help to continue making progress. If this happens, your health care provider may talk with you about:  Rupturing your amniotic sac if it has not ruptured yet.  Giving you medicine to help make your contractions stronger and more frequent.  Stage 1 ends when your cervix is completely dilated to 4 inches (10 cm) and completely effaced. This happens  at the end of the transition phase. Stage 2  Once your cervix is completely effaced and dilated to 4 inches (10 cm), you may start to feel an urge to push. It is common for the body to naturally take a rest before feeling the urge to push, especially if you received an epidural or certain other pain medicines. This rest period may last for up to 1-2 hours, depending on your unique labor experience.  During stage 2, contractions are generally less painful, because pushing helps relieve contraction pain. Instead of contraction pain, you may feel stretching and burning pain, especially when the widest part of your baby's head passes through the vaginal opening (crowning).  Your health care provider will closely monitor your pushing progress and your baby's progress through the vagina during stage 2.  Your health care provider may massage the area of skin between your vaginal opening and anus (perineum) or apply warm compresses to your perineum. This helps it stretch as the baby's head starts to crown, which can help prevent perineal tearing.  In some cases, an incision may  be made in your perineum (episiotomy) to allow the baby to pass through the vaginal opening. An episiotomy helps to make the opening of the vagina larger to allow more room for the baby to fit through.  It is very important to breathe and focus so your health care provider can control the delivery of your baby's head. Your health care provider may have you decrease the intensity of your pushing, to help prevent perineal tearing.  After delivery of your baby's head, the shoulders and the rest of the body generally deliver very quickly and without difficulty.  Once your baby is delivered, the umbilical cord may be cut right away, or this may be delayed for 1-2 minutes, depending on your baby's health. This may vary among health care providers, hospitals, and birth centers.  If you and your baby are healthy enough, your baby may be  placed on your chest or abdomen to help maintain the baby's temperature and to help you bond with each other. Some mothers and babies start breastfeeding at this time. Your health care team will dry your baby and help keep your baby warm during this time.  Your baby may need immediate care if he or she:  Showed signs of distress during labor.  Has a medical condition.  Was born too early (prematurely).  Had a bowel movement before birth (meconium).  Shows signs of difficulty transitioning from being inside the uterus to being outside of the uterus. If you are planning to breastfeed, your health care team will help you begin a feeding. Stage 3  The third stage of labor starts immediately after the birth of your baby and ends after you deliver the placenta. The placenta is an organ that develops during pregnancy to provide oxygen and nutrients to your baby in the womb.  Delivering the placenta may require some pushing, and you may have mild contractions. Breastfeeding can stimulate contractions to help you deliver the placenta.  After the placenta is delivered, your uterus should tighten (contract) and become firm. This helps to stop bleeding in your uterus. To help your uterus contract and to control bleeding, your health care provider may:  Give you medicine by injection, through an IV tube, by mouth, or through your rectum (rectally).  Massage your abdomen or perform a vaginal exam to remove any blood clots that are left in your uterus.  Empty your bladder by placing a thin, flexible tube (catheter) into your bladder.  Encourage you to breastfeed your baby. After labor is over, you and your baby will be monitored closely to ensure that you are both healthy until you are ready to go home. Your health care team will teach you how to care for yourself and your baby. This information is not intended to replace advice given to you by your health care provider. Make sure you discuss any  questions you have with your health care provider. Document Released: 02/16/2008 Document Revised: 11/27/2015 Document Reviewed: 05/24/2015 Elsevier Interactive Patient Education  2017 [employer].

## 2016-06-18 ENCOUNTER — Inpatient Hospital Stay (HOSPITAL_COMMUNITY): Payer: Medicaid Other | Admitting: Anesthesiology

## 2016-06-18 ENCOUNTER — Inpatient Hospital Stay (HOSPITAL_COMMUNITY)
Admission: AD | Admit: 2016-06-18 | Discharge: 2016-06-20 | DRG: 775 | Disposition: A | Discharge status: Home / Self Care | Payer: Medicaid Other | Source: Ambulatory Visit | Attending: Obstetrics & Gynecology | Admitting: Obstetrics & Gynecology

## 2016-06-18 ENCOUNTER — Encounter (HOSPITAL_COMMUNITY): Payer: Self-pay

## 2016-06-18 DIAGNOSIS — Z8249 Family history of ischemic heart disease and other diseases of the circulatory system: Secondary | ICD-10-CM

## 2016-06-18 DIAGNOSIS — Z3A39 39 weeks gestation of pregnancy: Secondary | ICD-10-CM | POA: Diagnosis not present

## 2016-06-18 DIAGNOSIS — Z833 Family history of diabetes mellitus: Secondary | ICD-10-CM

## 2016-06-18 DIAGNOSIS — Z3400 Encounter for supervision of normal first pregnancy, unspecified trimester: Secondary | ICD-10-CM

## 2016-06-18 DIAGNOSIS — Z3493 Encounter for supervision of normal pregnancy, unspecified, third trimester: Secondary | ICD-10-CM | POA: Diagnosis present

## 2016-06-18 LAB — CBC
HCT: 28.5 % — ABNORMAL LOW (ref 36.0–46.0)
Hemoglobin: 9.4 g/dL — ABNORMAL LOW (ref 12.0–15.0)
MCH: 23.7 pg — ABNORMAL LOW (ref 26.0–34.0)
MCHC: 33 g/dL (ref 30.0–36.0)
MCV: 72 fL — ABNORMAL LOW (ref 78.0–100.0)
Platelets: 386 10*3/uL (ref 150–400)
RBC: 3.96 MIL/uL (ref 3.87–5.11)
RDW: 15.6 % — ABNORMAL HIGH (ref 11.5–15.5)
WBC: 7.5 10*3/uL (ref 4.0–10.5)

## 2016-06-18 LAB — TYPE AND SCREEN
ABO/RH(D): O POS
Antibody Screen: NEGATIVE

## 2016-06-18 LAB — RPR: RPR Ser Ql: NONREACTIVE

## 2016-06-18 LAB — ABO/RH: ABO/RH(D): O POS

## 2016-06-18 MED ORDER — LACTATED RINGERS IV SOLN: 500.0000 mL | Freq: Once | INTRAVENOUS | Status: DC

## 2016-06-18 MED ORDER — COCONUT OIL OIL: 1.0000 "application " | TOPICAL_OIL | Status: DC | PRN

## 2016-06-18 MED ORDER — PHENYLEPHRINE 40 MCG/ML (10ML) SYRINGE FOR IV PUSH (FOR BLOOD PRESSURE SUPPORT)
80.0000 ug | PREFILLED_SYRINGE | INTRAVENOUS | Status: DC | PRN
  Administered 2016-06-18: 80 ug via INTRAVENOUS

## 2016-06-18 MED ORDER — EPHEDRINE 5 MG/ML INJ: 10.0000 mg | INTRAVENOUS | Status: DC | PRN

## 2016-06-18 MED ORDER — FENTANYL 2.5 MCG/ML BUPIVACAINE 1/10 % EPIDURAL INFUSION (WH - ANES)
INTRAMUSCULAR | Status: AC
  Filled 2016-06-18: qty 100

## 2016-06-18 MED ORDER — TERBUTALINE SULFATE 1 MG/ML IJ SOLN: 0.2500 mg | Freq: Once | INTRAMUSCULAR | Status: DC | PRN

## 2016-06-18 MED ORDER — ZOLPIDEM TARTRATE 5 MG PO TABS: 5.0000 mg | ORAL_TABLET | Freq: Every evening | ORAL | Status: DC | PRN

## 2016-06-18 MED ORDER — WITCH HAZEL-GLYCERIN EX PADS: 1.0000 "application " | MEDICATED_PAD | CUTANEOUS | Status: DC | PRN

## 2016-06-18 MED ORDER — PHENYLEPHRINE 40 MCG/ML (10ML) SYRINGE FOR IV PUSH (FOR BLOOD PRESSURE SUPPORT)
PREFILLED_SYRINGE | INTRAVENOUS | Status: AC
  Filled 2016-06-18: qty 10

## 2016-06-18 MED ORDER — DIBUCAINE 1 % RE OINT: 1.0000 "application " | TOPICAL_OINTMENT | RECTAL | Status: DC | PRN

## 2016-06-18 MED ORDER — ONDANSETRON HCL 4 MG PO TABS: 4.0000 mg | ORAL_TABLET | ORAL | Status: DC | PRN

## 2016-06-18 MED ORDER — FENTANYL CITRATE (PF) 100 MCG/2ML IJ SOLN
100.0000 ug | INTRAMUSCULAR | Status: DC | PRN
  Administered 2016-06-18: 100 ug via INTRAVENOUS

## 2016-06-18 MED ORDER — ONDANSETRON HCL 4 MG/2ML IJ SOLN: 4.0000 mg | Freq: Four times a day (QID) | INTRAMUSCULAR | Status: DC | PRN

## 2016-06-18 MED ORDER — SODIUM BICARBONATE 8.4 % IV SOLN
INTRAVENOUS | Status: DC | PRN
  Administered 2016-06-18: 6 mL via EPIDURAL
  Administered 2016-06-18: 5 mL via EPIDURAL

## 2016-06-18 MED ORDER — FENTANYL CITRATE (PF) 100 MCG/2ML IJ SOLN
INTRAMUSCULAR | Status: AC
  Administered 2016-06-18: 100 ug via INTRAVENOUS
  Filled 2016-06-18: qty 2

## 2016-06-18 MED ORDER — TERBUTALINE SULFATE 1 MG/ML IJ SOLN
INTRAMUSCULAR | Status: AC
  Filled 2016-06-18: qty 1

## 2016-06-18 MED ORDER — OXYTOCIN 40 UNITS IN LACTATED RINGERS INFUSION - SIMPLE MED
1.0000 m[IU]/min | INTRAVENOUS | Status: DC
  Administered 2016-06-18: 2 m[IU]/min via INTRAVENOUS
  Filled 2016-06-18: qty 1000

## 2016-06-18 MED ORDER — OXYTOCIN 40 UNITS IN LACTATED RINGERS INFUSION - SIMPLE MED: 2.5000 [IU]/h | INTRAVENOUS | Status: DC

## 2016-06-18 MED ORDER — DIPHENHYDRAMINE HCL 25 MG PO CAPS: 25.0000 mg | ORAL_CAPSULE | Freq: Four times a day (QID) | ORAL | Status: DC | PRN

## 2016-06-18 MED ORDER — LIDOCAINE HCL (PF) 1 % IJ SOLN
30.0000 mL | INTRAMUSCULAR | Status: DC | PRN
Start: 2016-06-18 — End: 2016-06-18

## 2016-06-18 MED ORDER — ONDANSETRON HCL 4 MG/2ML IJ SOLN: 4.0000 mg | INTRAMUSCULAR | Status: DC | PRN

## 2016-06-18 MED ORDER — SIMETHICONE 80 MG PO CHEW: 80.0000 mg | CHEWABLE_TABLET | ORAL | Status: DC | PRN

## 2016-06-18 MED ORDER — DIPHENHYDRAMINE HCL 50 MG/ML IJ SOLN: 12.5000 mg | INTRAMUSCULAR | Status: DC | PRN

## 2016-06-18 MED ORDER — ACETAMINOPHEN 325 MG PO TABS: 650.0000 mg | ORAL_TABLET | ORAL | Status: DC | PRN

## 2016-06-18 MED ORDER — PRENATAL MULTIVITAMIN CH
1.0000 | ORAL_TABLET | Freq: Every day | ORAL | Status: DC
  Administered 2016-06-19: 1 via ORAL
  Filled 2016-06-18: qty 1

## 2016-06-18 MED ORDER — TETANUS-DIPHTH-ACELL PERTUSSIS 5-2.5-18.5 LF-MCG/0.5 IM SUSP
0.5000 mL | Freq: Once | INTRAMUSCULAR | Status: DC
  Filled 2016-06-18: qty 0.5

## 2016-06-18 MED ORDER — PHENYLEPHRINE 40 MCG/ML (10ML) SYRINGE FOR IV PUSH (FOR BLOOD PRESSURE SUPPORT)
80.0000 ug | PREFILLED_SYRINGE | INTRAVENOUS | Status: DC | PRN
  Administered 2016-06-18 (×2): 80 ug via INTRAVENOUS

## 2016-06-18 MED ORDER — PHENYLEPHRINE 40 MCG/ML (10ML) SYRINGE FOR IV PUSH (FOR BLOOD PRESSURE SUPPORT): 80.0000 ug | PREFILLED_SYRINGE | INTRAVENOUS | Status: DC | PRN

## 2016-06-18 MED ORDER — LACTATED RINGERS IV SOLN
INTRAVENOUS | Status: DC
  Administered 2016-06-18 (×2): via INTRAVENOUS

## 2016-06-18 MED ORDER — OXYCODONE-ACETAMINOPHEN 5-325 MG PO TABS: 2.0000 | ORAL_TABLET | ORAL | Status: DC | PRN

## 2016-06-18 MED ORDER — LIDOCAINE HCL (PF) 1 % IJ SOLN: INTRAMUSCULAR | Status: DC | PRN

## 2016-06-18 MED ORDER — LACTATED RINGERS IV SOLN
INTRAVENOUS | Status: DC
  Administered 2016-06-18: 11:00:00 via INTRAUTERINE

## 2016-06-18 MED ORDER — BENZOCAINE-MENTHOL 20-0.5 % EX AERO
1.0000 "application " | INHALATION_SPRAY | CUTANEOUS | Status: DC | PRN
  Administered 2016-06-18: 1 via TOPICAL
  Filled 2016-06-18: qty 56

## 2016-06-18 MED ORDER — IBUPROFEN 600 MG PO TABS
600.0000 mg | ORAL_TABLET | Freq: Four times a day (QID) | ORAL | Status: DC
  Administered 2016-06-18 – 2016-06-20 (×7): 600 mg via ORAL
  Filled 2016-06-18 (×7): qty 1

## 2016-06-18 MED ORDER — OXYTOCIN BOLUS FROM INFUSION
500.0000 mL | Freq: Once | INTRAVENOUS | Status: AC
  Administered 2016-06-18: 500 mL via INTRAVENOUS

## 2016-06-18 MED ORDER — SOD CITRATE-CITRIC ACID 500-334 MG/5ML PO SOLN
30.0000 mL | ORAL | Status: DC | PRN
  Filled 2016-06-18: qty 15

## 2016-06-18 MED ORDER — OXYCODONE-ACETAMINOPHEN 5-325 MG PO TABS: 1.0000 | ORAL_TABLET | ORAL | Status: DC | PRN

## 2016-06-18 MED ORDER — FLEET ENEMA 7-19 GM/118ML RE ENEM: 1.0000 | ENEMA | RECTAL | Status: DC | PRN

## 2016-06-18 MED ORDER — FENTANYL 2.5 MCG/ML BUPIVACAINE 1/10 % EPIDURAL INFUSION (WH - ANES)
14.0000 mL/h | INTRAMUSCULAR | Status: DC | PRN
  Administered 2016-06-18: 3 mL/h via EPIDURAL

## 2016-06-18 MED ORDER — LACTATED RINGERS IV SOLN: 500.0000 mL | INTRAVENOUS | Status: DC | PRN

## 2016-06-18 MED ORDER — ACETAMINOPHEN 325 MG PO TABS
650.0000 mg | ORAL_TABLET | ORAL | Status: DC | PRN
  Administered 2016-06-18 – 2016-06-20 (×5): 650 mg via ORAL
  Filled 2016-06-18 (×6): qty 2

## 2016-06-18 MED ORDER — SENNOSIDES-DOCUSATE SODIUM 8.6-50 MG PO TABS
2.0000 | ORAL_TABLET | ORAL | Status: DC
  Administered 2016-06-18 – 2016-06-19 (×2): 2 via ORAL
  Filled 2016-06-18 (×2): qty 2

## 2016-06-18 NOTE — H&P (Signed)
Sherry Spence is a 26 y.o. female G2P1001 @ 39.4wks presenting for reg ctx. Denies leaking or bldg. No N/V/D or H/A. Her preg has been followed by the [facility] and has been essentially unremarkable.  OB History    Gravida Para Term Preterm AB Living   2 1 1     1    SAB TAB Ectopic Multiple Live Births           1     Past Medical History:  Diagnosis Date  . Cholelithiasis   . Headache   . History of chlamydia infection    Past Surgical History:  Procedure Laterality Date  . NO PAST SURGERIES     Family History: family history includes Cancer in her maternal grandmother; Diabetes in her maternal grandmother, paternal aunt, and paternal grandmother; Hypertension in her father and paternal grandmother. Social History:  reports that she has never smoked. She has never used smokeless tobacco. She reports that she does not drink alcohol or use drugs.     Maternal Diabetes: No Genetic Screening: Normal Maternal Ultrasounds/Referrals: Normal Fetal Ultrasounds or other Referrals:  None Maternal Substance Abuse:  No Significant Maternal Medications:  None Significant Maternal Lab Results:  Lab values include: Group B Strep negative Other Comments:  None  ROS History Dilation: 4 Effacement (%): 100 Station: -2 Exam by:: BENJI, RN Height 5\' 6"  (1.676 m), weight 112.9 kg (249 lb), last menstrual period 09/15/2015. Exam Physical Exam  Constitutional: She is oriented to person, place, and time. She appears well-developed.  HENT:  Head: Normocephalic.  Neck: Normal range of motion.  Cardiovascular: Normal rate.   Respiratory: Effort normal.  GI:  EFM 125-135, +10x10accels, avg LTV, occ variables Ctx q 3-4 mins  Musculoskeletal: Normal range of motion.  Neurological: She is alert and oriented to person, place, and time.  Skin: Skin is warm and dry.  Psychiatric: She has a normal mood and affect. Her behavior is normal. Thought content normal.    Prenatal  labs: ABO, Rh: --/--/O POS (08/07 0930) Antibody: Negative (08/07 0000) Rubella:   RPR: NON REAC (10/27 1030)  HBsAg: Negative (08/07 0000)  HIV: NONREACTIVE (10/27 1030)  GBS:   neg 05/24/16  Assessment/Plan: IUP@39 .4wks Early labor GBS neg  Admit to [facility] Expectant management Anticipate SVD   Serita Grammes CNM 06/18/2016, 6:49 AM

## 2016-06-18 NOTE — Anesthesia Procedure Notes (Addendum)
Epidural Patient location during procedure: OB Start time: 06/18/2016 7:35 AM End time: 06/18/2016 7:44 AM  Staffing Anesthesiologist: Suella Broad D Performed: anesthesiologist   Preanesthetic Checklist Completed: patient identified, site marked, surgical consent, pre-op evaluation, timeout performed, IV checked, risks and benefits discussed and monitors and equipment checked  Epidural Patient position: sitting Prep: ChloraPrep Patient monitoring: heart rate, continuous pulse ox and blood pressure Approach: midline Location: L3-L4 Injection technique: LOR saline  Needle:  Needle type: Tuohy  Needle gauge: 17 G Needle length: 9 cm Catheter type: closed end flexible Catheter size: 20 Guage Test dose: negative and 1.5% lidocaine  Assessment Events: blood not aspirated, injection not painful, no injection resistance and no paresthesia  Additional Notes LOR @ 5.5  Patient identified. Risks/Benefits/Options discussed with patient including but not limited to bleeding, infection, nerve damage, paralysis, failed block, incomplete pain control, headache, blood pressure changes, nausea, vomiting, reactions to medications, itching and postpartum back pain. Confirmed with bedside nurse the patient's most recent platelet count. Confirmed with patient that they are not currently taking any anticoagulation, have any bleeding history or any family history of bleeding disorders. Patient expressed understanding and wished to proceed. All questions were answered. Sterile technique was used throughout the entire procedure. Please see nursing notes for vital signs. Test dose was given through epidural catheter and negative prior to continuing to dose epidural or start infusion. Warning signs of high block given to the patient including shortness of breath, tingling/numbness in hands, complete motor block, or any concerning symptoms with instructions to call for help. Patient was given instructions on  fall risk and not to get out of bed. All questions and concerns addressed with instructions to call with any issues or inadequate analgesia.    Reason for block:procedure for pain

## 2016-06-18 NOTE — Anesthesia Preprocedure Evaluation (Signed)
Anesthesia Evaluation  Patient identified by MRN, date of birth, ID band Patient awake    Reviewed: Allergy & Precautions, NPO status , Patient's Chart, lab work & pertinent test results  Airway Mallampati: III       Dental  (+) Teeth Intact   Pulmonary neg pulmonary ROS,    breath sounds clear to auscultation       Cardiovascular negative cardio ROS   Rhythm:Regular Rate:Normal     Neuro/Psych  Headaches, negative psych ROS   GI/Hepatic negative GI ROS, Neg liver ROS,   Endo/Other  negative endocrine ROS  Renal/GU negative Renal ROS  negative genitourinary   Musculoskeletal negative musculoskeletal ROS (+)   Abdominal   Peds negative pediatric ROS (+)  Hematology negative hematology ROS (+)   Anesthesia Other Findings   Reproductive/Obstetrics (+) Pregnancy                             Lab Results  Component Value Date   WBC 7.5 06/18/2016   HGB 9.4 (L) 06/18/2016   HCT 28.5 (L) 06/18/2016   MCV 72.0 (L) 06/18/2016   PLT 386 06/18/2016     Anesthesia Physical Anesthesia Plan  ASA: III  Anesthesia Plan: Epidural   Post-op Pain Management:    Induction:   Airway Management Planned:   Additional Equipment:   Intra-op Plan:   Post-operative Plan:   Informed Consent: I have reviewed the patients History and Physical, chart, labs and discussed the procedure including the risks, benefits and alternatives for the proposed anesthesia with the patient or authorized representative who has indicated his/her understanding and acceptance.     Plan Discussed with:   Anesthesia Plan Comments:         Anesthesia Quick Evaluation

## 2016-06-18 NOTE — Progress Notes (Signed)
Patient ID: Sherry Spence, female   DOB: 1991-03-10, 26 y.o.   MRN: [id number] Called to see patient for prolonged deceleration  Vitals:   06/18/16 0647 06/18/16 0703 06/18/16 0831  BP:  118/72   Pulse:  76   Resp:  18 18  Temp:   98.1 F (36.7 C)  TempSrc:   Oral  Weight: 249 lb (112.9 kg)    Height: 5\' 6"  (1.676 m)      FHR baseline 120s with decel to 70-80s  UCs every 2-3 min  Usual measures employed with resolution of heart rate Decel lasted 4-5 min  Cervix is 7/90/-2-3/vertex is molded  IUPC placed  Will start amnioinfusion

## 2016-06-18 NOTE — Progress Notes (Signed)
Patient ID: Sherry Spence, female   DOB: 12-02-1990, 26 y.o.   MRN: [id number] Still very numb with epidural Dr Elly Modena reviewed FHR tracing and recommends restarting Pitocin  Vitals:   06/18/16 0703 06/18/16 0831 06/18/16 1001 06/18/16 1201  BP: 118/72     Pulse: 76     Resp: 18 18 18 18   Temp:  98.1 F (36.7 C) 97.4 F (36.3 C) 97.4 F (36.3 C)  TempSrc:  Oral Oral Oral  Weight:      Height:        FHR baseline has been changing from  120s to 100-110s. + accelerations to 140s Good variability  Some variable and late decels scattered irregularly  Dilation: 10 Dilation Complete Date: 06/18/16 Dilation Complete Time: 1444 Effacement (%): 100 Cervical Position: Middle Station: 0 Presentation: Vertex Exam by:: [name] cnm  Will start pushing

## 2016-06-18 NOTE — Progress Notes (Signed)
Patient ID: Sherry Spence, female   DOB: 09-01-1990, 26 y.o.   MRN: [id number] IUPC tracing reviewed  Contractions are hypotonic  Score is about 140 MVU/10 min  Will start Pitocin augmentation after 30 min rest for fetus.

## 2016-06-19 NOTE — Plan of Care (Signed)
Problem: Nutritional: Goal: Mothers verbalization of comfort with breastfeeding process will improve Outcome: Not Applicable Date Met: 01/05/82 Baby in NICU, unable to assess.

## 2016-06-19 NOTE — Plan of Care (Signed)
Problem: Role Relationship: Goal: Ability to demonstrate positive interaction with newborn will improve Outcome: Not Applicable Date Met: 72/09/19 Baby    In NICU

## 2016-06-19 NOTE — Lactation Note (Signed)
This note was copied from a baby's chart. Lactation Consultation Note  Patient Name: Sherry Spence [id number] Date: 06/19/2016   NICu baby 37 hours old. Mom reports that she intends to give formula and is not interested in pumping to provide EBM. Provided anticipatory guidance, and mom aware of Flora assistance as needed.   Maternal Data    Feeding Feeding Type: Formula Nipple Type: Slow - flow Length of feed: 15 min  LATCH Score/Interventions                      Lactation Tools Discussed/Used     Consult Status      Andres Labrum 06/19/2016, 5:57 PM

## 2016-06-19 NOTE — Anesthesia Postprocedure Evaluation (Signed)
Anesthesia Post Note  Patient: Sherry Spence  Procedure(s) Performed: * No procedures listed *  Patient location during evaluation: Mother Baby Anesthesia Type: Epidural Level of consciousness: awake and alert, oriented and patient cooperative Pain management: pain level controlled Vital Signs Assessment: post-procedure vital signs reviewed and stable Respiratory status: spontaneous breathing Cardiovascular status: stable Postop Assessment: no headache, epidural receding, patient able to bend at knees and no signs of nausea or vomiting Anesthetic complications: no Comments: Pain score 0.        Last Vitals:  Vitals:   06/18/16 2330 06/19/16 0540  BP: 132/82 (!) 100/50  Pulse: 83 75  Resp: 20 18  Temp: 36.7 C 37.1 C    Last Pain:  Vitals:   06/19/16 0540  TempSrc: Oral  PainSc: 2    Pain Goal: Patients Stated Pain Goal: 2 (06/19/16 0540)               Rico Sheehan

## 2016-06-19 NOTE — Progress Notes (Signed)
Post Partum Day 1 Subjective: no complaints, up ad lib, voiding and tolerating PO  Baby is in NICU due to some respiratory distress and cardiac systolic murmur. Doing well.  Objective: Blood pressure 133/78, pulse 70, temperature 98.6 F (37 C), temperature source Oral, resp. rate 18, height 5\' 6"  (1.676 m), weight 249 lb (112.9 kg), last menstrual period 09/15/2015, SpO2 98 %, unknown if currently breastfeeding.  Physical Exam:  General: alert and no distress Lochia: appropriate Uterine Fundus: firm DVT Evaluation: No evidence of DVT seen on physical exam. Negative Homan's sign.   Recent Labs  06/18/16 0649  HGB 9.4*  HCT 28.5*    Assessment/Plan: Plan for discharge tomorrow, Breastfeeding and Contraception Nuvaring Routine postpartum care   LOS: 1 day   Verita Schneiders, MD 06/19/2016, 10:22 AM

## 2016-06-19 NOTE — Progress Notes (Signed)
Admission nutrition screen triggered for unintentional weight loss > 10 lbs within the last month. . Patients chart reviewed and assessed  for nutritional risk. Patient is determined to be at low nutrition  risk.    Lamonda Noxon M.Ed. R.D. LDN Neonatal Nutrition Support Specialist/RD III Pager [phone]      Phone [phone]  

## 2016-06-20 ENCOUNTER — Ambulatory Visit: Payer: Self-pay

## 2016-06-20 MED ORDER — IBUPROFEN 600 MG PO TABS: 600.0000 mg | ORAL_TABLET | Freq: Four times a day (QID) | ORAL | 0 refills | Status: DC

## 2016-06-20 NOTE — Progress Notes (Signed)
Discharge teaching complete. Pt understood all instructions and did not have any questions. Pt ambulated out of the hospital and discharged home to family.  

## 2016-06-20 NOTE — Discharge Summary (Signed)
OB Discharge Summary     Patient Name: Sherry Spence DOB: 1991-05-10 MRN: [id number]  Date of admission: 06/18/2016 Delivering MD: Seabron Spates   Date of discharge: 06/20/2016  Admitting diagnosis: 28 weeks labor Intrauterine pregnancy: [redacted]w[redacted]d     Secondary diagnosis:  Active Problems:   Normal labor  Additional problems: Baby to NICU for respiratory problems and heart murmur.  He will go home tomorrow     Discharge diagnosis: Term Pregnancy Delivered                                                                                                Post partum procedures:none  Augmentation: Pitocin  Complications: None  Hospital course:  Onset of Labor With Vaginal Delivery     26 y.o. yo [id number] at [redacted]w[redacted]d was admitted in Active Labor on 06/18/2016. Patient had an uncomplicated labor course as follows:  Membrane Rupture Time/Date: 7:53 AM ,06/18/2016   Intrapartum Procedures: Episiotomy: None [1]                                         Lacerations:  Periurethral [8]  Patient had a delivery of a Viable infant. 06/18/2016  Information for the patient's newborn:  Christain, Bermudes [id number]  Delivery Method: Vaginal, Spontaneous Delivery (Filed from Delivery Summary)    Pateint had an uncomplicated postpartum course.  She is ambulating, tolerating a regular diet, passing flatus, and urinating well. Patient is discharged home in stable condition on 06/20/16.   Physical exam  Vitals:   06/19/16 0859 06/19/16 1934 06/20/16 0553 06/20/16 0729  BP: 133/78 116/64 (!) 100/53 (!) 122/55  Pulse: 70 78 78 62  Resp: 18 18 20 18   Temp: 98.6 F (37 C) 98.9 F (37.2 C) 98.5 F (36.9 C) 98.1 F (36.7 C)  TempSrc: Oral Oral Oral Oral  SpO2: 98% 100% 100% 98%  Weight:      Height:       General: alert, cooperative and no distress Lochia: appropriate Uterine Fundus: firm Incision: Healing well with no significant drainage DVT Evaluation: No evidence of DVT seen on  physical exam. Labs: Lab Results  Component Value Date   WBC 7.5 06/18/2016   HGB 9.4 (L) 06/18/2016   HCT 28.5 (L) 06/18/2016   MCV 72.0 (L) 06/18/2016   PLT 386 06/18/2016   CMP Latest Ref Rng & Units 01/07/2016  Glucose 65 - 99 mg/dL 112(H)  BUN 6 - 20 mg/dL 6  Creatinine 0.44 - 1.00 mg/dL 0.45  Sodium 135 - 145 mmol/L 135  Potassium 3.5 - 5.1 mmol/L 3.3(L)  Chloride 101 - 111 mmol/L 107  CO2 22 - 32 mmol/L 21(L)  Calcium 8.9 - 10.3 mg/dL 8.6(L)  Total Protein 6.5 - 8.1 g/dL 7.3  Total Bilirubin 0.3 - 1.2 mg/dL 0.5  Alkaline Phos 38 - 126 U/L 117  AST 15 - 41 U/L 21  ALT 14 - 54 U/L 33    Discharge instruction: per After Visit Summary and "Baby  and Me Booklet".  After visit meds:  Allergies as of 06/20/2016      Reactions   Compazine [prochlorperazine Edisylate] Anxiety      Medication List    TAKE these medications   acetaminophen 500 MG tablet Commonly known as:  TYLENOL Take 1,000 mg by mouth every 6 (six) hours as needed for mild pain, moderate pain or headache.   ibuprofen 600 MG tablet Commonly known as:  ADVIL,MOTRIN Take 1 tablet (600 mg total) by mouth every 6 (six) hours.   multivitamin-prenatal 27-0.8 MG Tabs tablet Take 1 tablet by mouth daily.       Diet: routine diet  Activity: Advance as tolerated. Pelvic rest for 6 weeks.   Outpatient follow up:6 weeks Follow up Appt:Future Appointments Date Time Provider [place]  07/18/2016 2:15 PM Osborne Oman, MD [facility]   Follow up Visit:No Follow-up on file.  Postpartum contraception: Nuvaring  Newborn Data: Live born female  Birth Weight: 9 lb 7.3 oz (4290 g) APGAR: 7, 8  Baby Feeding: Bottle and Breast Disposition:NICU   06/20/2016 Hansel Feinstein, CNM

## 2016-06-20 NOTE — Lactation Note (Signed)
This note was copied from a baby's chart. Lactation Consultation Note  Patient Name: Sherry Spence [id number] Date: 06/20/2016 Reason for consult: Initial assessment  With this mom of a term NICU baby, now 71 hours old, and rooming in with mom tonight in the NICU. The baby was thought to have CHD at birth, but this has since been ruled out. He has been formula feeding up until now. I assisted mom with latching him for the first time. He is a Risk manager! I assisted mom with latching and cross cradle position, and how to keep baby latched deeply. Mom was surprised and smiling. She said the latch was slightly uncomfortable., but fine. With hand expression earlier to day, I was not able to express any colostrum, but this baby was latch with strong, rhythmic suckles. I showed mom how to keep him stimulated to suckle, and when done, at breast, to allow him to bottle feed.  The baby was having an ECHO after this feeding, but mom encouraged to latch him prior to each feeding today, and through the night. Mom to call for lactation as needed today. I told mom to see lactation prior to discharge tomorrow, and to make an o/p lactation appointment, if she decides to continue with breastfeeding.  Mom does not have a DEP set up at this time, but I did give her a manual hand pump to use, at least every 3 hours, to stimulate her milk supply, along with latching the baby. Mom will ask for DEP if she decides to continue with pumping.    Maternal Data Formula Feeding for Exclusion: Yes (baby in NICU) Has patient been taught Hand Expression?: Yes Does the patient have breastfeeding experience prior to this delivery?: No  Feeding Feeding Type: Breast Fed Length of feed: 10 min  LATCH Score/Interventions Latch: Grasps breast easily, tongue down, lips flanged, rhythmical sucking.  Audible Swallowing: None  Type of Nipple: Everted at rest and after stimulation  Comfort (Breast/Nipple): Soft / non-tender      Hold (Positioning): Assistance needed to correctly position infant at breast and maintain latch. Intervention(s): Breastfeeding basics reviewed;Support Pillows;Position options;Skin to skin  LATCH Score: 7  Lactation Tools Discussed/Used WIC Program: No (mom given contact information for [facility], to apply and possibley get DEP) Pump Review: Setup, frequency, and cleaning;Milk Storage;Other (comment) (hand expression, use of manual DEP and care of it also) Initiated by:: chrsitine Glynis Smiles, IBCLC Date initiated:: 06/20/16   Consult Status Consult Status: Follow-up Date: 06/21/16 Follow-up type: In-patient    Tonna Corner 06/20/2016, 11:07 AM

## 2016-06-20 NOTE — Discharge Instructions (Signed)

## 2016-06-21 ENCOUNTER — Encounter: Payer: Medicaid Other | Admitting: Family Medicine

## 2016-06-21 LAB — RUBELLA SCREEN: Rubella: 9.56 index (ref >0.99)

## 2016-06-24 ENCOUNTER — Other Ambulatory Visit: Payer: Medicaid Other

## 2016-07-18 ENCOUNTER — Ambulatory Visit: Payer: Medicaid Other | Admitting: Obstetrics & Gynecology

## 2016-07-18 MED ORDER — ETONOGESTREL-ETHINYL ESTRADIOL 0.12-0.015 MG/24HR VA RING: VAGINAL_RING | VAGINAL | 12 refills | Status: DC

## 2016-07-18 NOTE — Progress Notes (Signed)
Post Partum Exam  Sherry Spence is a 26 y.o. G58P2002 female who presents for a postpartum visit. She is 4 weeks postpartum following a spontaneous vaginal delivery. I have fully reviewed the prenatal and intrapartum course. The delivery was at [redacted]w[redacted]d gestational weeks.  Anesthesia: epidural. Postpartum course has been unremarkable. Baby's course has been unremarkable. Baby is feeding by both breast and bottle - Similac Advance. Bleeding no bleeding. Bowel function is normal. Bladder function is normal. Patient is not sexually active. Contraception method is NuvaRing vaginal inserts. Postpartum depression screening: Negative - Score=0.   The following portions of the patient's history were reviewed and updated as appropriate: allergies, current medications, past family history, past medical history, past social history, past surgical history and problem list.  Last pap smear was done in 2016 and was normal; was done in [facility].  Review of Systems Pertinent items noted in HPI and remainder of comprehensive ROS otherwise negative.    Objective:  Blood pressure 131/84, pulse 67, height 5\' 7"  (1.702 m), weight 226 lb (102.5 kg), currently breastfeeding.  General:  alert and no distress   Breasts:  inspection negative, no nipple discharge or bleeding, no masses or nodularity palpable  Lungs: clear to auscultation bilaterally  Heart:  regular rate and rhythm  Abdomen: soft, non-tender; bowel sounds normal; no masses,  no organomegaly   Pelvic:  not evaluated        Assessment:   Normal postpartum exam. Pap smear not done at today's visit.   Plan:   1. Contraception: NuvaRing vaginal inserts 2. Follow up for BP check in 2 months.    Verita Schneiders, MD, [place] Attending [place], [facility] for [employer], [place]

## 2016-07-18 NOTE — Patient Instructions (Signed)
Places to have your son circumcised:     [facility] [phone] $244 by 4 wks  [facility] [phone] $175 by 2 wks  [facility] [phone] $250 by 7 days [facility] [phone] $150 by 4 wks  These prices sometimes change but are roughly what you can expect to pay. Please call and confirm pricing.   Circumcision is considered an elective/non-medically necessary procedure. There are many reasons parents decide to have their sons circumsized. During the first year of life circumcised males have a reduced risk of urinary tract infections but after this year the rates between circumcised males and uncircumcised males are the same.  It is safe to have your son circumcised outside of the hospital and the places above perform them regularly.

## 2016-08-01 ENCOUNTER — Ambulatory Visit: Payer: Medicaid Other | Admitting: Obstetrics & Gynecology

## 2016-08-09 ENCOUNTER — Ambulatory Visit: Payer: Medicaid Other | Admitting: Family Medicine

## 2016-08-09 ENCOUNTER — Telehealth: Payer: Self-pay | Admitting: *Deleted

## 2016-08-09 DIAGNOSIS — Z309 Encounter for contraceptive management, unspecified: Secondary | ICD-10-CM

## 2016-08-09 MED ORDER — NORETHIN ACE-ETH ESTRAD-FE 1-20 MG-MCG(24) PO TABS: 1.0000 | ORAL_TABLET | Freq: Every day | ORAL | 11 refills | Status: DC

## 2016-08-09 NOTE — Telephone Encounter (Signed)
Pt called in stating she would like to start OCP and cancel Nexplanon insert appt. Per Dr. Kennon Rounds, sent in [place] per pt request.

## 2016-10-26 ENCOUNTER — Ambulatory Visit: Payer: Medicaid Other | Admitting: Obstetrics and Gynecology

## 2016-11-01 ENCOUNTER — Ambulatory Visit (INDEPENDENT_AMBULATORY_CARE_PROVIDER_SITE_OTHER): Payer: Medicaid Other | Admitting: Obstetrics and Gynecology

## 2016-11-01 ENCOUNTER — Encounter: Payer: Self-pay | Admitting: Obstetrics and Gynecology

## 2016-11-01 ENCOUNTER — Ambulatory Visit: Payer: Medicaid Other | Admitting: Obstetrics and Gynecology

## 2016-11-01 VITALS — BP 127/82 | HR 76 | Ht 66.0 in | Wt 245.0 lb

## 2016-11-01 DIAGNOSIS — Z3689 Encounter for other specified antenatal screening: Secondary | ICD-10-CM | POA: Diagnosis not present

## 2016-11-01 DIAGNOSIS — Z331 Pregnant state, incidental: Secondary | ICD-10-CM

## 2016-11-01 DIAGNOSIS — N912 Amenorrhea, unspecified: Secondary | ICD-10-CM

## 2016-11-01 DIAGNOSIS — Z3202 Encounter for pregnancy test, result negative: Secondary | ICD-10-CM | POA: Diagnosis not present

## 2016-11-01 LAB — POCT URINE PREGNANCY: Preg Test, Ur: POSITIVE — AB

## 2016-11-01 MED ORDER — VITAFOL ULTRA 29-0.6-0.4-200 MG PO CAPS: 1.0000 | ORAL_CAPSULE | Freq: Every day | ORAL | 12 refills | Status: DC

## 2016-11-01 NOTE — Progress Notes (Signed)
26 yo G2P2 here for Nexplanon insertion. UPT positive  Patient informed of pregnancy. Bedside ultrasound performed demonstrating a 5w gestational sac, with yolk sac and fetal pole without cardiac activity. Patient will return for viability ultrasound next week Rx prenatal vitamins provided

## 2016-11-07 ENCOUNTER — Encounter: Payer: Self-pay | Admitting: *Deleted

## 2016-11-07 ENCOUNTER — Ambulatory Visit (INDEPENDENT_AMBULATORY_CARE_PROVIDER_SITE_OTHER): Payer: Medicaid Other | Admitting: *Deleted

## 2016-11-07 DIAGNOSIS — O0990 Supervision of high risk pregnancy, unspecified, unspecified trimester: Secondary | ICD-10-CM | POA: Insufficient documentation

## 2016-11-07 DIAGNOSIS — Z3481 Encounter for supervision of other normal pregnancy, first trimester: Secondary | ICD-10-CM | POA: Diagnosis not present

## 2016-11-07 DIAGNOSIS — Z3480 Encounter for supervision of other normal pregnancy, unspecified trimester: Secondary | ICD-10-CM

## 2016-11-07 NOTE — Progress Notes (Signed)
PRENATAL INTAKE SUMMARY  Ms. Gamm presents today New OB Nurse Interview.  OB History    Gravida Para Term Preterm AB Living   3 2 2     2    SAB TAB Ectopic Multiple Live Births         0 2     I have reviewed the patient's medical, obstetrical, social, and family histories, medications, and available lab results.  SUBJECTIVE She has no unusual complaints and complains of nausea with vomiting for 3 days  OBJECTIVE Initial Physical Exam (New OB)  GENERAL APPEARANCE: alert, well appearing, in no apparent distress, oriented to person, place and time, overweight, well hydrated  DATING AND VIABILITY SONOGRAM ZHANNA MELIN is a 26 y.o. year old G25P2002 with LMP No LMP recorded. Patient is pregnant. which would correlate to  [redacted]w[redacted]d weeks gestation.  She has irregular menstrual cycles.   She is here today for a confirmatory initial sonogram.    GESTATION: [place] - [redacted]w[redacted]d      FETAL ACTIVITY:          Heart rate         124bpm          The fetus is active.    GESTATIONAL AGE AND  BIOMETRICS:  Gestational criteria: Estimated Date of Delivery: 07/02/17 by early ultrasound now at [redacted]w[redacted]d  Previous Scans:0  CROWN RUMP LENGTH           0.583cm [redacted]w[redacted]d                                                   AVERAGE EGA(BY THIS SCAN):  [redacted]w[redacted]d weeks  WORKING EDD( early ultrasound ):  07/02/17     TECHNICIAN COMMENTS:  Bedside US shows SLIUP with FHR 124bpm measuring [redacted]w[redacted]d CRL   A copy of this report including all images has been saved and backed up to a second source for retrieval if needed. All measures and details of the anatomical scan, placentation, fluid volume and pelvic anatomy are contained in that report.    ASSESSMENT Normal pregnancy  PLAN Prenatal care scheduled at [facility] Urine Culture GC/CT Pregnancy Verification Letter  Will need OB Panel, SMA, Mandy Hutchinson 11/07/2016 2:12 PM

## 2016-11-09 LAB — URINE CULTURE, OB REFLEX

## 2016-11-09 LAB — CULTURE, OB URINE

## 2016-11-10 LAB — GC/CHLAMYDIA PROBE AMP
Chlamydia trachomatis, NAA: NEGATIVE
Neisseria gonorrhoeae by PCR: NEGATIVE

## 2016-11-21 ENCOUNTER — Telehealth: Payer: Self-pay | Admitting: *Deleted

## 2016-11-21 DIAGNOSIS — K529 Noninfective gastroenteritis and colitis, unspecified: Secondary | ICD-10-CM

## 2016-11-21 MED ORDER — PROMETHAZINE HCL 25 MG PO TABS: 25.0000 mg | ORAL_TABLET | Freq: Four times a day (QID) | ORAL | 0 refills | Status: DC | PRN

## 2016-11-21 NOTE — Telephone Encounter (Signed)
Pt is currently [redacted] weeks pregnant, states she has the stomach virus and has been having vomiting and diarrhea since 10 o'clock last night. Informed pt that we could send her in some antiemetics to help control the vomiting and reviewed dehydration symptoms.  Instructed to go to [facility] if symptoms persist and she starts to show signs of dehydration.  Pt acknowledged instructions.

## 2016-11-21 NOTE — Telephone Encounter (Signed)
-----   Message from Blanchie Dessert, [place] sent at 11/21/2016  3:48 PM EDT ----- Regarding: pt would like call back from RN Contact: [phone] Patient is requesting a call from the Nurse.  And dats you!!! ;)

## 2016-11-28 ENCOUNTER — Encounter: Payer: Self-pay | Admitting: Family Medicine

## 2016-11-28 ENCOUNTER — Ambulatory Visit (INDEPENDENT_AMBULATORY_CARE_PROVIDER_SITE_OTHER): Payer: Medicaid Other | Admitting: Family Medicine

## 2016-11-28 ENCOUNTER — Other Ambulatory Visit (HOSPITAL_COMMUNITY)
Admission: RE | Admit: 2016-11-28 | Discharge: 2016-11-28 | Disposition: A | Discharge status: Home / Self Care | Payer: Medicaid Other | Source: Ambulatory Visit | Attending: Family Medicine | Admitting: Family Medicine

## 2016-11-28 VITALS — BP 117/79 | HR 79 | Wt 248.0 lb

## 2016-11-28 DIAGNOSIS — Z3481 Encounter for supervision of other normal pregnancy, first trimester: Secondary | ICD-10-CM

## 2016-11-28 DIAGNOSIS — Z3480 Encounter for supervision of other normal pregnancy, unspecified trimester: Secondary | ICD-10-CM

## 2016-11-28 DIAGNOSIS — Z01419 Encounter for gynecological examination (general) (routine) without abnormal findings: Secondary | ICD-10-CM | POA: Diagnosis not present

## 2016-11-28 DIAGNOSIS — Z6841 Body Mass Index (BMI) 40.0 and over, adult: Secondary | ICD-10-CM

## 2016-11-28 DIAGNOSIS — O09891 Supervision of other high risk pregnancies, first trimester: Secondary | ICD-10-CM | POA: Diagnosis not present

## 2016-11-28 DIAGNOSIS — O09899 Supervision of other high risk pregnancies, unspecified trimester: Secondary | ICD-10-CM

## 2016-11-28 MED ORDER — DOXYLAMINE-PYRIDOXINE ER 20-20 MG PO TBCR: 1.0000 | EXTENDED_RELEASE_TABLET | Freq: Every day | ORAL | 3 refills | Status: DC

## 2016-11-28 NOTE — Progress Notes (Signed)
Subjective:  Sherry Spence is a [id number] [redacted]w[redacted]d being seen today for her first obstetrical visit.  Her obstetrical history is significant for short interval of pregnancy, obesity. Patient does intend to breast feed. Pregnancy history fully reviewed.  Patient reports no complaints.  BP 117/79   Pulse 79   Wt 248 lb (112.5 kg)   LMP  (LMP Unknown)   BMI 40.03 kg/m   HISTORY: OB History  Gravida Para Term Preterm AB Living  3 2 2  0 0 2  SAB TAB Ectopic Multiple Live Births  0 0 0 0 2    # Outcome Date GA Lbr Len/2nd Weight Sex Delivery Anes PTL Lv  3 Current           2 Term 06/18/16 [redacted]w[redacted]d 09:44 / 00:13 9 lb 7.3 oz (4.29 kg) M Vag-Spont EPI  LIV  1 Term 03/05/14 [redacted]w[redacted]d  7 lb 5 oz (3.317 kg) F Vag-Spont  N LIV     Complications: Meconium aspiration     Birth Comments: Baby- had surgery right after delivery "cyst blocking airway"      Past Medical History:  Diagnosis Date  . Cholelithiasis   . Headache   . History of chlamydia infection     Past Surgical History:  Procedure Laterality Date  . NO PAST SURGERIES      Family History  Problem Relation Age of Onset  . Hypertension Father   . Diabetes Paternal Aunt   . Cancer Maternal Grandmother   . Diabetes Maternal Grandmother   . Diabetes Paternal Grandmother   . Hypertension Paternal Grandmother   . Mental retardation Neg Hx      Exam    Uterus:     Pelvic Exam:    Perineum: No Hemorrhoids, Normal Perineum   Vulva: normal, Bartholin's, Urethra, Skene's normal   Vagina:  normal mucosa   Cervix: multiparous appearance   Adnexa: normal adnexa and no mass, fullness, tenderness   Bony Pelvis: gynecoid  System: Breast:  normal appearance, no masses or tenderness   Skin: normal coloration and turgor, no rashes    Neurologic: normal mood   Extremities: normal strength, tone, and muscle mass   HEENT PERRLA and extra ocular movement intact   Mouth/Teeth mucous membranes moist, pharynx normal without lesions   Neck supple and no masses   Cardiovascular: regular rate and rhythm, no murmurs or gallops   Respiratory:  appears well, vitals normal, no respiratory distress, acyanotic, normal RR, ear and throat exam is normal, neck free of mass or lymphadenopathy, chest clear, no wheezing, crepitations, rhonchi, normal symmetric air entry   Abdomen: soft, non-tender; bowel sounds normal; no masses,  no organomegaly   Urinary: urethral meatus normal      Assessment:    Pregnancy: [id number] Patient Active Problem List   Diagnosis Date Noted  . Supervision of other normal pregnancy, antepartum 11/07/2016  . BMI 40.0-44.9, adult ([place]) 03/18/2016      Plan:   1. Supervision of other normal pregnancy, antepartum FHT normal - Cytology - PAP - Hemoglobinopathy evaluation - Obstetric Panel, Including HIV - Cystic Fibrosis Mutation 97 - SMN1 Copy Number Analysis - Hemoglobin A1c - CMP and Liver - Protein / creatinine ratio, urine - [place] MFM Fetal Nuchal Translucency; Future - Doxylamine-Pyridoxine ER (BONJESTA) 20-20 MG TBCR; Take 1 tablet by mouth at bedtime.  Dispense: 60 tablet; Refill: 3  2. Short interval between pregnancies affecting pregnancy, antepartum  3. BMI 40.0-44.9, adult ([place])     Problem  list reviewed and updated. 50% of 100 min visit spent on counseling and coordination of care.     Truett Mainland 11/28/2016

## 2016-11-30 ENCOUNTER — Encounter: Payer: Self-pay | Admitting: *Deleted

## 2016-11-30 LAB — CYTOLOGY - PAP: Diagnosis: NEGATIVE

## 2016-12-12 LAB — HEMOGLOBINOPATHY EVALUATION
HGB C: 0 %
HGB S: 0 %
HGB VARIANT: 0 %
Hemoglobin A2 Quantitation: 2.2 % (ref 1.8–3.2)
Hemoglobin F Quantitation: 0 % (ref 0.0–2.0)
Hgb A: 97.8 % (ref 96.4–98.8)

## 2016-12-12 LAB — SMN1 COPY NUMBER ANALYSIS (SMA CARRIER SCREENING)

## 2016-12-12 LAB — CMP AND LIVER
ALT: 69 IU/L — ABNORMAL HIGH (ref 0–32)
AST: 22 IU/L (ref 0–40)
Albumin: 3.5 g/dL (ref 3.5–5.5)
Alkaline Phosphatase: 136 IU/L — ABNORMAL HIGH (ref 39–117)
BUN: 7 mg/dL (ref 6–20)
Bilirubin Total: 0.2 mg/dL (ref 0.0–1.2)
Bilirubin, Direct: 0.08 mg/dL (ref 0.00–0.40)
CO2: 20 mmol/L (ref 20–29)
Calcium: 8.9 mg/dL (ref 8.7–10.2)
Chloride: 102 mmol/L (ref 96–106)
Creatinine, Ser: 0.56 mg/dL — ABNORMAL LOW (ref 0.57–1.00)
GFR calc Af Amer: 147 mL/min/{1.73_m2} (ref >59)
GFR calc non Af Amer: 127 mL/min/{1.73_m2} (ref >59)
Glucose: 96 mg/dL (ref 65–99)
Potassium: 4.9 mmol/L (ref 3.5–5.2)
Sodium: 139 mmol/L (ref 134–144)
Total Protein: 6.9 g/dL (ref 6.0–8.5)

## 2016-12-12 LAB — OBSTETRIC PANEL, INCLUDING HIV
Antibody Screen: NEGATIVE
Basophils Absolute: 0 10*3/uL (ref 0.0–0.2)
Basos: 0 %
EOS (ABSOLUTE): 0.2 10*3/uL (ref 0.0–0.4)
Eos: 3 %
HIV Screen 4th Generation wRfx: NONREACTIVE
Hematocrit: 34.4 % (ref 34.0–46.6)
Hemoglobin: 10.9 g/dL — ABNORMAL LOW (ref 11.1–15.9)
Hepatitis B Surface Ag: NEGATIVE
Immature Grans (Abs): 0 10*3/uL (ref 0.0–0.1)
Immature Granulocytes: 0 %
Lymphocytes Absolute: 1.8 10*3/uL (ref 0.7–3.1)
Lymphs: 31 %
MCH: 25.8 pg — ABNORMAL LOW (ref 26.6–33.0)
MCHC: 31.7 g/dL (ref 31.5–35.7)
MCV: 81 fL (ref 79–97)
Monocytes Absolute: 0.3 10*3/uL (ref 0.1–0.9)
Monocytes: 4 %
Neutrophils Absolute: 3.6 10*3/uL (ref 1.4–7.0)
Neutrophils: 62 %
Platelets: 430 10*3/uL — ABNORMAL HIGH (ref 150–379)
RBC: 4.23 x10E6/uL (ref 3.77–5.28)
RDW: 14.4 % (ref 12.3–15.4)
RPR Ser Ql: NONREACTIVE
Rh Factor: POSITIVE
Rubella Antibodies, IGG: 16.4 index (ref >0.99)
WBC: 5.9 10*3/uL (ref 3.4–10.8)

## 2016-12-12 LAB — CYSTIC FIBROSIS MUTATION 97: Interpretation: NOT DETECTED

## 2016-12-12 LAB — PROTEIN / CREATININE RATIO, URINE
Creatinine, Urine: 213.5 mg/dL
Protein, Ur: 18.9 mg/dL
Protein/Creat Ratio: 89 mg/g creat (ref 0–200)

## 2016-12-12 LAB — HEMOGLOBIN A1C
Est. average glucose Bld gHb Est-mCnc: 103 mg/dL
Hgb A1c MFr Bld: 5.2 % (ref 4.8–5.6)

## 2016-12-26 ENCOUNTER — Ambulatory Visit (INDEPENDENT_AMBULATORY_CARE_PROVIDER_SITE_OTHER): Payer: Medicaid Other | Admitting: Obstetrics and Gynecology

## 2016-12-26 VITALS — BP 122/77 | HR 92 | Wt 253.0 lb

## 2016-12-26 DIAGNOSIS — R7401 Elevation of levels of liver transaminase levels: Secondary | ICD-10-CM

## 2016-12-26 DIAGNOSIS — O0990 Supervision of high risk pregnancy, unspecified, unspecified trimester: Secondary | ICD-10-CM

## 2016-12-26 DIAGNOSIS — R740 Nonspecific elevation of levels of transaminase and lactic acid dehydrogenase [LDH]: Secondary | ICD-10-CM

## 2016-12-26 DIAGNOSIS — O09891 Supervision of other high risk pregnancies, first trimester: Secondary | ICD-10-CM

## 2016-12-26 NOTE — Progress Notes (Signed)
Prenatal Visit Note Date: 12/26/2016 Clinic: [facility]  Subjective:  Sherry Spence is a 26 y.o. [id number] at [redacted]w[redacted]d being seen today for ongoing prenatal care.  She is currently monitored for the following issues for this low-risk pregnancy and has BMI 40.0-44.9, adult ([place]); Obesity in pregnancy; Supervision of high risk pregnancy, antepartum; and Short interval between pregnancies affecting pregnancy in first trimester, antepartum on her problem list.  Patient reports no complaints.   Contractions: Not present. Vag. Bleeding: None.  Movement: Present. Denies leaking of fluid.   The following portions of the patient's history were reviewed and updated as appropriate: allergies, current medications, past family history, past medical history, past social history, past surgical history and problem list. Problem list updated.  Objective:   Vitals:   12/26/16 1555  BP: 122/77  Pulse: 92  Weight: 253 lb (114.8 kg)    Fetal Status: Fetal Heart Rate (bpm): 150s   Movement: Present     General:  Alert, oriented and cooperative. Patient is in no acute distress.  Skin: Skin is warm and dry. No rash noted.   Cardiovascular: Normal heart rate noted  Respiratory: Normal respiratory effort, no problems with respiration noted  Abdomen: Soft, gravid, appropriate for gestational age. Pain/Pressure: Absent     Pelvic:  Cervical exam deferred        Extremities: Normal range of motion.     Mental Status: Normal mood and affect. Normal behavior. Normal judgment and thought content.   Urinalysis:      Assessment and Plan:  Pregnancy: G3P2002 at [redacted]w[redacted]d  1. Supervision of high risk pregnancy, antepartum Routine care. NT scan later this week. Anatomy scan in one month. afp nv and rpt cmp - [place] MFM OB DETAIL +14 WK; Future   Preterm labor symptoms and general obstetric precautions including but not limited to vaginal bleeding, contractions, leaking of fluid and fetal movement  were reviewed in detail with the patient. Please refer to After Visit Summary for other counseling recommendations.  Return in about 3 weeks (around 01/16/2017) for rob.   Aletha Halim, MD

## 2016-12-28 ENCOUNTER — Other Ambulatory Visit (HOSPITAL_COMMUNITY): Payer: Medicaid Other

## 2016-12-28 ENCOUNTER — Ambulatory Visit (HOSPITAL_COMMUNITY): Admission: RE | Admit: 2016-12-28 | Payer: Medicaid Other | Source: Ambulatory Visit

## 2017-01-11 ENCOUNTER — Telehealth: Payer: Self-pay

## 2017-01-11 NOTE — Telephone Encounter (Signed)
Called [place] for [employer] prior authorization.  Medication approved [id number].  Patient aware.

## 2017-01-16 ENCOUNTER — Ambulatory Visit (INDEPENDENT_AMBULATORY_CARE_PROVIDER_SITE_OTHER): Payer: Medicaid Other | Admitting: Family Medicine

## 2017-01-16 VITALS — BP 122/85 | HR 90 | Wt 257.0 lb

## 2017-01-16 DIAGNOSIS — O0990 Supervision of high risk pregnancy, unspecified, unspecified trimester: Secondary | ICD-10-CM

## 2017-01-16 DIAGNOSIS — R748 Abnormal levels of other serum enzymes: Secondary | ICD-10-CM

## 2017-01-17 NOTE — Progress Notes (Signed)
   PRENATAL VISIT NOTE  Subjective:  Sherry Spence is a 26 y.o. G3P2002 at [redacted]w[redacted]d being seen today for ongoing prenatal care.  She is currently monitored for the following issues for this high-risk pregnancy and has BMI 40.0-44.9, adult ([place]); Obesity in pregnancy; Supervision of high risk pregnancy, antepartum; Short interval between pregnancies affecting pregnancy in first trimester, antepartum; and Transaminitis on her problem list.  Patient reports no complaints.  Contractions: Not present. Vag. Bleeding: None.  Movement: Present. Denies leaking of fluid.   The following portions of the patient's history were reviewed and updated as appropriate: allergies, current medications, past family history, past medical history, past social history, past surgical history and problem list. Problem list updated.  Objective:   Vitals:   01/16/17 1546  BP: 122/85  Pulse: 90  Weight: 257 lb (116.6 kg)    Fetal Status: Fetal Heart Rate (bpm): 150s   Movement: Present     General:  Alert, oriented and cooperative. Patient is in no acute distress.  Skin: Skin is warm and dry. No rash noted.   Cardiovascular: Normal heart rate noted  Respiratory: Normal respiratory effort, no problems with respiration noted  Abdomen: Soft, gravid, appropriate for gestational age.  Pain/Pressure: Absent     Pelvic: Cervical exam deferred        Extremities: Normal range of motion.  Edema: Trace  Mental Status:  Normal mood and affect. Normal behavior. Normal judgment and thought content.   Assessment and Plan:  Pregnancy: G3P2002 at [redacted]w[redacted]d  1. Supervision of high risk pregnancy, antepartum Quad screen today Detailed anatomy u/s scheduled - AFP TETRA  2. Abnormal liver enzymes Repeat LFT's - Comprehensive metabolic panel  General obstetric precautions including but not limited to vaginal bleeding, contractions, leaking of fluid and fetal movement were reviewed in detail with the patient. Please refer to  After Visit Summary for other counseling recommendations.  Return in about 4 weeks (around 02/13/2017).   Donnamae Jude, MD

## 2017-01-18 LAB — AFP TETRA
DIA Mom Value: 0.6
DIA Value (EIA): 75.6 pg/mL
DSR (By Age)    1 IN: 805
DSR (Second Trimester) 1 IN: 10000
Gestational Age: 16.1 WEEKS
MSAFP Mom: 1.17
MSAFP: 31.2 ng/mL
MSHCG Mom: 0.73
MSHCG: 19788 m[IU]/mL
Maternal Age At EDD: 28.7 yr
Osb Risk: 10000
T18 (By Age): 1:3134 {titer}
Test Results:: NEGATIVE
Weight: 257 [lb_av]
uE3 Mom: 1.32
uE3 Value: 0.91 ng/mL

## 2017-01-18 LAB — COMPREHENSIVE METABOLIC PANEL
ALT: 46 IU/L — ABNORMAL HIGH (ref 0–32)
AST: 27 IU/L (ref 0–40)
Albumin/Globulin Ratio: 1 — ABNORMAL LOW (ref 1.2–2.2)
Albumin: 3.4 g/dL — ABNORMAL LOW (ref 3.5–5.5)
Alkaline Phosphatase: 144 IU/L — ABNORMAL HIGH (ref 39–117)
BUN/Creatinine Ratio: 10 (ref 9–23)
BUN: 7 mg/dL (ref 6–20)
Bilirubin Total: <0.2 mg/dL (ref 0.0–1.2)
CO2: 21 mmol/L (ref 20–29)
Calcium: 8.7 mg/dL (ref 8.7–10.2)
Chloride: 103 mmol/L (ref 96–106)
Creatinine, Ser: 0.68 mg/dL (ref 0.57–1.00)
GFR calc Af Amer: 138 mL/min/{1.73_m2} (ref >59)
GFR calc non Af Amer: 119 mL/min/{1.73_m2} (ref >59)
Globulin, Total: 3.4 g/dL (ref 1.5–4.5)
Glucose: 82 mg/dL (ref 65–99)
Potassium: 4.2 mmol/L (ref 3.5–5.2)
Sodium: 140 mmol/L (ref 134–144)
Total Protein: 6.8 g/dL (ref 6.0–8.5)

## 2017-02-07 ENCOUNTER — Encounter (HOSPITAL_COMMUNITY): Payer: Self-pay

## 2017-02-07 ENCOUNTER — Ambulatory Visit (HOSPITAL_COMMUNITY)
Admission: RE | Admit: 2017-02-07 | Discharge: 2017-02-07 | Disposition: A | Discharge status: Home / Self Care | Payer: Medicaid Other | Source: Ambulatory Visit | Attending: Obstetrics and Gynecology | Admitting: Obstetrics and Gynecology

## 2017-02-07 DIAGNOSIS — O0990 Supervision of high risk pregnancy, unspecified, unspecified trimester: Secondary | ICD-10-CM

## 2017-02-07 DIAGNOSIS — Z3A19 19 weeks gestation of pregnancy: Secondary | ICD-10-CM | POA: Diagnosis not present

## 2017-02-07 DIAGNOSIS — O99212 Obesity complicating pregnancy, second trimester: Secondary | ICD-10-CM | POA: Insufficient documentation

## 2017-02-07 DIAGNOSIS — Z3689 Encounter for other specified antenatal screening: Secondary | ICD-10-CM | POA: Insufficient documentation

## 2017-02-07 DIAGNOSIS — O0992 Supervision of high risk pregnancy, unspecified, second trimester: Secondary | ICD-10-CM | POA: Insufficient documentation

## 2017-02-07 DIAGNOSIS — E669 Obesity, unspecified: Secondary | ICD-10-CM | POA: Insufficient documentation

## 2017-02-07 DIAGNOSIS — O322XX0 Maternal care for transverse and oblique lie, not applicable or unspecified: Secondary | ICD-10-CM | POA: Insufficient documentation

## 2017-02-08 ENCOUNTER — Ambulatory Visit (INDEPENDENT_AMBULATORY_CARE_PROVIDER_SITE_OTHER): Payer: Medicaid Other | Admitting: Obstetrics and Gynecology

## 2017-02-08 ENCOUNTER — Other Ambulatory Visit (HOSPITAL_COMMUNITY): Payer: Self-pay | Admitting: *Deleted

## 2017-02-08 VITALS — BP 135/83 | HR 90 | Wt 264.5 lb

## 2017-02-08 DIAGNOSIS — O0990 Supervision of high risk pregnancy, unspecified, unspecified trimester: Secondary | ICD-10-CM

## 2017-02-08 DIAGNOSIS — O99210 Obesity complicating pregnancy, unspecified trimester: Secondary | ICD-10-CM

## 2017-02-08 DIAGNOSIS — O09891 Supervision of other high risk pregnancies, first trimester: Secondary | ICD-10-CM

## 2017-02-08 NOTE — Progress Notes (Addendum)
   PRENATAL VISIT NOTE  Subjective:  Sherry Spence is a 26 y.o. G3P2002 at [redacted]w[redacted]d being seen today for ongoing prenatal care.  She is currently monitored for the following issues for this high-risk pregnancy and has BMI 40.0-44.9, adult ([place]); Obesity in pregnancy; Supervision of high risk pregnancy, antepartum; Short interval between pregnancies affecting pregnancy in first trimester, antepartum; and Transaminitis on her problem list.  Patient reports no complaints.   .  .  Movement: Present. Denies leaking of fluid.   The following portions of the patient's history were reviewed and updated as appropriate: allergies, current medications, past family history, past medical history, past social history, past surgical history and problem list. Problem list updated.  Objective:   Vitals:   02/08/17 1604  BP: 135/83  Pulse: 90  Weight: 264 lb 8 oz (120 kg)    Fetal Status: Fetal Heart Rate (bpm): + on sono   Movement: Present     General:  Alert, oriented and cooperative. Patient is in no acute distress.  Skin: Skin is warm and dry. No rash noted.   Cardiovascular: Normal heart rate noted  Respiratory: Normal respiratory effort, no problems with respiration noted  Abdomen: Soft, gravid, appropriate for gestational age.  Pain/Pressure: Absent     Pelvic: Cervical exam deferred        Extremities: Normal range of motion.     Mental Status:  Normal mood and affect. Normal behavior. Normal judgment and thought content.   Assessment and Plan:  Pregnancy: G3P2002 at [redacted]w[redacted]d  1. Supervision of high risk pregnancy, antepartum Patient is doing well without complaints Anatomy ultrasound reviewed. Patient has follow up on 10/30 Reviewed quad screen results Patient declined flu vaccine today  2. Short interval between pregnancies affecting pregnancy in first trimester, antepartum   Preterm labor symptoms and general obstetric precautions including but not limited to vaginal bleeding,  contractions, leaking of fluid and fetal movement were reviewed in detail with the patient. Please refer to After Visit Summary for other counseling recommendations.  Return in about 4 weeks (around 03/08/2017) for ROB.   Mora Bellman, MD

## 2017-02-20 ENCOUNTER — Telehealth: Payer: Self-pay | Admitting: *Deleted

## 2017-02-20 DIAGNOSIS — O219 Vomiting of pregnancy, unspecified: Secondary | ICD-10-CM

## 2017-02-20 DIAGNOSIS — R300 Dysuria: Secondary | ICD-10-CM

## 2017-02-20 MED ORDER — DOXYLAMINE-PYRIDOXINE ER 20-20 MG PO TBCR: 1.0000 | EXTENDED_RELEASE_TABLET | Freq: Every day | ORAL | 3 refills | Status: DC

## 2017-02-20 MED ORDER — NITROFURANTOIN MONOHYD MACRO 100 MG PO CAPS: 100.0000 mg | ORAL_CAPSULE | Freq: Two times a day (BID) | ORAL | 1 refills | Status: DC

## 2017-02-20 NOTE — Telephone Encounter (Signed)
Pt called in stating she has dysuria and needs Rx for Bonjesta.

## 2017-02-21 ENCOUNTER — Other Ambulatory Visit: Payer: Medicaid Other | Admitting: *Deleted

## 2017-03-02 ENCOUNTER — Telehealth: Payer: Self-pay | Admitting: *Deleted

## 2017-03-02 NOTE — Telephone Encounter (Signed)
Called pt back and LM for her to rtn call

## 2017-03-02 NOTE — Telephone Encounter (Signed)
-----   Message from Blanchie Dessert, [place] sent at 02/28/2017  4:30 PM EDT ----- Regarding: pt requested a call back from [place]: [phone] Please call patient when you get a second, she asked to speak to you , specifically

## 2017-03-07 ENCOUNTER — Encounter: Payer: Medicaid Other | Admitting: Obstetrics & Gynecology

## 2017-03-15 ENCOUNTER — Encounter: Payer: Medicaid Other | Admitting: Student

## 2017-03-21 ENCOUNTER — Encounter (HOSPITAL_COMMUNITY): Payer: Self-pay

## 2017-03-21 ENCOUNTER — Ambulatory Visit (HOSPITAL_COMMUNITY)
Admission: RE | Admit: 2017-03-21 | Discharge: 2017-03-21 | Disposition: A | Discharge status: Home / Self Care | Payer: Medicaid Other | Source: Ambulatory Visit | Attending: Obstetrics and Gynecology | Admitting: Obstetrics and Gynecology

## 2017-04-03 ENCOUNTER — Encounter: Payer: Medicaid Other | Admitting: Obstetrics and Gynecology

## 2017-04-04 ENCOUNTER — Ambulatory Visit (INDEPENDENT_AMBULATORY_CARE_PROVIDER_SITE_OTHER): Payer: Medicaid Other | Admitting: Obstetrics and Gynecology

## 2017-04-04 VITALS — BP 131/85 | HR 91 | Wt 269.0 lb

## 2017-04-04 DIAGNOSIS — Z6841 Body Mass Index (BMI) 40.0 and over, adult: Secondary | ICD-10-CM

## 2017-04-04 DIAGNOSIS — O0993 Supervision of high risk pregnancy, unspecified, third trimester: Secondary | ICD-10-CM

## 2017-04-04 DIAGNOSIS — R7401 Elevation of levels of liver transaminase levels: Secondary | ICD-10-CM

## 2017-04-04 DIAGNOSIS — O0990 Supervision of high risk pregnancy, unspecified, unspecified trimester: Secondary | ICD-10-CM

## 2017-04-04 DIAGNOSIS — R740 Nonspecific elevation of levels of transaminase and lactic acid dehydrogenase [LDH]: Secondary | ICD-10-CM

## 2017-04-04 NOTE — Progress Notes (Signed)
Taking tylenol nightly

## 2017-04-04 NOTE — Progress Notes (Signed)
Recheck blood pressure 131/85

## 2017-04-05 NOTE — Progress Notes (Signed)
Prenatal Visit Note Date: 04/04/2017 Clinic: [facility]  Subjective:  Sherry Spence is a 26 y.o. [id number] at [redacted]w[redacted]d being seen today for ongoing prenatal care.  She is currently monitored for the following issues for this high-risk pregnancy and has BMI 40.0-44.9, adult ([place]); Obesity in pregnancy; Supervision of high risk pregnancy, antepartum; Short interval between pregnancies affecting pregnancy in first trimester, antepartum; and Transaminitis on their problem list.  Patient reports no complaints.   Contractions: Not present. Vag. Bleeding: None.  Movement: Present. Denies leaking of fluid.   The following portions of the patient's history were reviewed and updated as appropriate: allergies, current medications, past family history, past medical history, past social history, past surgical history and problem list. Problem list updated.  Objective:   Vitals:   04/04/17 1611 04/04/17 1619  BP: (!) 144/85 131/85  Pulse: 91   Weight: 269 lb (122 kg)     Fetal Status: Fetal Heart Rate (bpm): 145   Movement: Present     General:  Alert, oriented and cooperative. Patient is in no acute distress.  Skin: Skin is warm and dry. No rash noted.   Cardiovascular: Normal heart rate noted  Respiratory: Normal respiratory effort, no problems with respiration noted  Abdomen: Soft, gravid, appropriate for gestational age. Pain/Pressure: Present     Pelvic:  Cervical exam deferred        Extremities: Normal range of motion.     Mental Status: Normal mood and affect. Normal behavior. Normal judgment and thought content.   Urinalysis:      Assessment and Plan:  Pregnancy: G3P2002 at [redacted]w[redacted]d  1. Supervision of high risk pregnancy, antepartum 28wk labs nv. Check cmp with labs. Pt no show'ed to growth u/s. Pt declines rescheduling  2. Transaminitis  3. BMI 40.0-44.9, adult ([place])  Preterm labor symptoms and general obstetric precautions including but not limited to  vaginal bleeding, contractions, leaking of fluid and fetal movement were reviewed in detail with the patient. Please refer to After Visit Summary for other counseling recommendations.  Return in about 1 week (around 04/11/2017) for rob and 28wk labs. and rob 3wks.   Aletha Halim, MD

## 2017-04-11 ENCOUNTER — Other Ambulatory Visit: Payer: Self-pay | Admitting: *Deleted

## 2017-04-11 DIAGNOSIS — O0990 Supervision of high risk pregnancy, unspecified, unspecified trimester: Secondary | ICD-10-CM

## 2017-04-12 ENCOUNTER — Ambulatory Visit (INDEPENDENT_AMBULATORY_CARE_PROVIDER_SITE_OTHER): Payer: Medicaid Other | Admitting: Family Medicine

## 2017-04-12 ENCOUNTER — Encounter: Payer: Self-pay | Admitting: Family Medicine

## 2017-04-12 ENCOUNTER — Other Ambulatory Visit: Payer: Self-pay

## 2017-04-12 VITALS — BP 122/81 | HR 84 | Wt 268.0 lb

## 2017-04-12 DIAGNOSIS — R740 Nonspecific elevation of levels of transaminase and lactic acid dehydrogenase [LDH]: Secondary | ICD-10-CM

## 2017-04-12 DIAGNOSIS — O0990 Supervision of high risk pregnancy, unspecified, unspecified trimester: Secondary | ICD-10-CM

## 2017-04-12 DIAGNOSIS — R7401 Elevation of levels of liver transaminase levels: Secondary | ICD-10-CM

## 2017-04-12 LAB — CBC
Hematocrit: 32.3 % — ABNORMAL LOW (ref 34.0–46.6)
Hemoglobin: 10.4 g/dL — ABNORMAL LOW (ref 11.1–15.9)
MCH: 25.4 pg — ABNORMAL LOW (ref 26.6–33.0)
MCHC: 32.2 g/dL (ref 31.5–35.7)
MCV: 79 fL (ref 79–97)
Platelets: 393 10*3/uL — ABNORMAL HIGH (ref 150–379)
RBC: 4.1 x10E6/uL (ref 3.77–5.28)
RDW: 15.6 % — ABNORMAL HIGH (ref 12.3–15.4)
WBC: 7.3 10*3/uL (ref 3.4–10.8)

## 2017-04-12 NOTE — Progress Notes (Signed)
   PRENATAL VISIT NOTE  Subjective:  Sherry Spence is a 26 y.o. G3P2002 at [redacted]w[redacted]d being seen today for ongoing prenatal care.  She is currently monitored for the following issues for this high-risk pregnancy and has BMI 40.0-44.9, adult ([place]); Obesity in pregnancy; Supervision of high risk pregnancy, antepartum; Short interval between pregnancies affecting pregnancy in first trimester, antepartum; and Transaminitis on their problem list.  Patient reports no complaints.  Contractions: Irregular. Vag. Bleeding: None.  Movement: Present. Denies leaking of fluid.   The following portions of the patient's history were reviewed and updated as appropriate: allergies, current medications, past family history, past medical history, past social history, past surgical history and problem list. Problem list updated.  Objective:   Vitals:   04/12/17 0928  BP: 122/81  Pulse: 84  Weight: 268 lb (121.6 kg)    Fetal Status: Fetal Heart Rate (bpm): 145 Fundal Height: 32 cm Movement: Present     General:  Alert, oriented and cooperative. Patient is in no acute distress.  Skin: Skin is warm and dry. No rash noted.   Cardiovascular: Normal heart rate noted  Respiratory: Normal respiratory effort, no problems with respiration noted  Abdomen: Soft, gravid, appropriate for gestational age.  Pain/Pressure: Present     Pelvic: Cervical exam deferred        Extremities: Normal range of motion.  Edema: None  Mental Status:  Normal mood and affect. Normal behavior. Normal judgment and thought content.   Assessment and Plan:  Pregnancy: G3P2002 at [redacted]w[redacted]d  1. Supervision of high risk pregnancy, antepartum Continue prenatal care. 28 wk labs today Declined TDaP today--offer again later BTL papers signed  2. Transaminitis Repeat CMP - Comprehensive metabolic panel  3. S>D-->f/u u/s  Preterm labor symptoms and general obstetric precautions including but not limited to vaginal bleeding, contractions,  leaking of fluid and fetal movement were reviewed in detail with the patient. Please refer to After Visit Summary for other counseling recommendations.  Return in 2 weeks (on 04/26/2017).   Donnamae Jude, MD

## 2017-04-12 NOTE — Patient Instructions (Signed)
 Third Trimester of Pregnancy The third trimester is from week 28 through week 40 (months 7 through 9). The third trimester is a time when the unborn baby (fetus) is growing rapidly. At the end of the ninth month, the fetus is about 20 inches in length and weighs 6-10 pounds. Body changes during your third trimester Your body will continue to go through many changes during pregnancy. The changes vary from woman to woman. During the third trimester:  Your weight will continue to increase. You can expect to gain 25-35 pounds (11-16 kg) by the end of the pregnancy.  You may begin to get stretch marks on your hips, abdomen, and breasts.  You may urinate more often because the fetus is moving lower into your pelvis and pressing on your bladder.  You may develop or continue to have heartburn. This is caused by increased hormones that slow down muscles in the digestive tract.  You may develop or continue to have constipation because increased hormones slow digestion and cause the muscles that push waste through your intestines to relax.  You may develop hemorrhoids. These are swollen veins (varicose veins) in the rectum that can itch or be painful.  You may develop swollen, bulging veins (varicose veins) in your legs.  You may have increased body aches in the pelvis, back, or thighs. This is due to weight gain and increased hormones that are relaxing your joints.  You may have changes in your hair. These can include thickening of your hair, rapid growth, and changes in texture. Some women also have hair loss during or after pregnancy, or hair that feels dry or thin. Your hair will most likely return to normal after your baby is born.  Your breasts will continue to grow and they will continue to become tender. A yellow fluid (colostrum) may leak from your breasts. This is the first milk you are producing for your baby.  Your belly button may stick out.  You may notice more swelling in your  hands, face, or ankles.  You may have increased tingling or numbness in your hands, arms, and legs. The skin on your belly may also feel numb.  You may feel short of breath because of your expanding uterus.  You may have more problems sleeping. This can be caused by the size of your belly, increased need to urinate, and an increase in your body's metabolism.  You may notice the fetus "dropping," or moving lower in your abdomen (lightening).  You may have increased vaginal discharge.  You may notice your joints feel loose and you may have pain around your pelvic bone.  What to expect at prenatal visits You will have prenatal exams every 2 weeks until week 36. Then you will have weekly prenatal exams. During a routine prenatal visit:  You will be weighed to make sure you and the baby are growing normally.  Your blood pressure will be taken.  Your abdomen will be measured to track your baby's growth.  The fetal heartbeat will be listened to.  Any test results from the previous visit will be discussed.  You may have a cervical check near your due date to see if your cervix has softened or thinned (effaced).  You will be tested for Group B streptococcus. This happens between 35 and 37 weeks.  Your health care provider may ask you:  What your birth plan is.  How you are feeling.  If you are feeling the baby move.  If you have   had any abnormal symptoms, such as leaking fluid, bleeding, severe headaches, or abdominal cramping.  If you are using any tobacco products, including cigarettes, chewing tobacco, and electronic cigarettes.  If you have any questions.  Other tests or screenings that may be performed during your third trimester include:  Blood tests that check for low iron levels (anemia).  Fetal testing to check the health, activity level, and growth of the fetus. Testing is done if you have certain medical conditions or if there are problems during the  pregnancy.  Nonstress test (NST). This test checks the health of your baby to make sure there are no signs of problems, such as the baby not getting enough oxygen. During this test, a belt is placed around your belly. The baby is made to move, and its heart rate is monitored during movement.  What is false labor? False labor is a condition in which you feel small, irregular tightenings of the muscles in the womb (contractions) that usually go away with rest, changing position, or drinking water. These are called Braxton Hicks contractions. Contractions may last for hours, days, or even weeks before true labor sets in. If contractions come at regular intervals, become more frequent, increase in intensity, or become painful, you should see your health care provider. What are the signs of labor?  Abdominal cramps.  Regular contractions that start at 10 minutes apart and become stronger and more frequent with time.  Contractions that start on the top of the uterus and spread down to the lower abdomen and back.  Increased pelvic pressure and dull back pain.  A watery or bloody mucus discharge that comes from the vagina.  Leaking of amniotic fluid. This is also known as your "water breaking." It could be a slow trickle or a gush. Let your health care provider know if it has a color or strange odor. If you have any of these signs, call your health care provider right away, even if it is before your due date. Follow these instructions at home: Medicines  Follow your health care provider's instructions regarding medicine use. Specific medicines may be either safe or unsafe to take during pregnancy.  Take a prenatal vitamin that contains at least 600 micrograms (mcg) of folic acid.  If you develop constipation, try taking a stool softener if your health care provider approves. Eating and drinking  Eat a balanced diet that includes fresh fruits and vegetables, whole grains, good sources of protein  such as meat, eggs, or tofu, and low-fat dairy. Your health care provider will help you determine the amount of weight gain that is right for you.  Avoid raw meat and uncooked cheese. These carry germs that can cause birth defects in the baby.  If you have low calcium intake from food, talk to your health care provider about whether you should take a daily calcium supplement.  Eat four or five small meals rather than three large meals a day.  Limit foods that are high in fat and processed sugars, such as fried and sweet foods.  To prevent constipation: ? Drink enough fluid to keep your urine clear or pale yellow. ? Eat foods that are high in fiber, such as fresh fruits and vegetables, whole grains, and beans. Activity  Exercise only as directed by your health care provider. Most women can continue their usual exercise routine during pregnancy. Try to exercise for 30 minutes at least 5 days a week. Stop exercising if you experience uterine contractions.  Avoid   heavy lifting.  Do not exercise in extreme heat or humidity, or at high altitudes.  Wear low-heel, comfortable shoes.  Practice good posture.  You may continue to have sex unless your health care provider tells you otherwise. Relieving pain and discomfort  Take frequent breaks and rest with your legs elevated if you have leg cramps or low back pain.  Take warm sitz baths to soothe any pain or discomfort caused by hemorrhoids. Use hemorrhoid cream if your health care provider approves.  Wear a good support bra to prevent discomfort from breast tenderness.  If you develop varicose veins: ? Wear support pantyhose or compression stockings as told by your healthcare provider. ? Elevate your feet for 15 minutes, 3-4 times a day. Prenatal care  Write down your questions. Take them to your prenatal visits.  Keep all your prenatal visits as told by your health care provider. This is important. Safety  Wear your seat belt at  all times when driving.  Make a list of emergency phone numbers, including numbers for family, friends, the hospital, and police and fire departments. General instructions  Avoid cat litter boxes and soil used by cats. These carry germs that can cause birth defects in the baby. If you have a cat, ask someone to clean the litter box for you.  Do not travel far distances unless it is absolutely necessary and only with the approval of your health care provider.  Do not use hot tubs, steam rooms, or saunas.  Do not drink alcohol.  Do not use any products that contain nicotine or tobacco, such as cigarettes and e-cigarettes. If you need help quitting, ask your health care provider.  Do not use any medicinal herbs or unprescribed drugs. These chemicals affect the formation and growth of the baby.  Do not douche or use tampons or scented sanitary pads.  Do not cross your legs for long periods of time.  To prepare for the arrival of your baby: ? Take prenatal classes to understand, practice, and ask questions about labor and delivery. ? Make a trial run to the hospital. ? Visit the hospital and tour the maternity area. ? Arrange for maternity or paternity leave through employers. ? Arrange for family and friends to take care of pets while you are in the hospital. ? Purchase a rear-facing car seat and make sure you know how to install it in your car. ? Pack your hospital bag. ? Prepare the baby's nursery. Make sure to remove all pillows and stuffed animals from the baby's crib to prevent suffocation.  Visit your dentist if you have not gone during your pregnancy. Use a soft toothbrush to brush your teeth and be gentle when you floss. Contact a health care provider if:  You are unsure if you are in labor or if your water has broken.  You become dizzy.  You have mild pelvic cramps, pelvic pressure, or nagging pain in your abdominal area.  You have lower back pain.  You have persistent  nausea, vomiting, or diarrhea.  You have an unusual or bad smelling vaginal discharge.  You have pain when you urinate. Get help right away if:  Your water breaks before 37 weeks.  You have regular contractions less than 5 minutes apart before 37 weeks.  You have a fever.  You are leaking fluid from your vagina.  You have spotting or bleeding from your vagina.  You have severe abdominal pain or cramping.  You have rapid weight loss or weight   gain.  You have shortness of breath with chest pain.  You notice sudden or extreme swelling of your face, hands, ankles, feet, or legs.  Your baby makes fewer than 10 movements in 2 hours.  You have severe headaches that do not go away when you take medicine.  You have vision changes. Summary  The third trimester is from week 28 through week 40, months 7 through 9. The third trimester is a time when the unborn baby (fetus) is growing rapidly.  During the third trimester, your discomfort may increase as you and your baby continue to gain weight. You may have abdominal, leg, and back pain, sleeping problems, and an increased need to urinate.  During the third trimester your breasts will keep growing and they will continue to become tender. A yellow fluid (colostrum) may leak from your breasts. This is the first milk you are producing for your baby.  False labor is a condition in which you feel small, irregular tightenings of the muscles in the womb (contractions) that eventually go away. These are called Braxton Hicks contractions. Contractions may last for hours, days, or even weeks before true labor sets in.  Signs of labor can include: abdominal cramps; regular contractions that start at 10 minutes apart and become stronger and more frequent with time; watery or bloody mucus discharge that comes from the vagina; increased pelvic pressure and dull back pain; and leaking of amniotic fluid. This information is not intended to replace advice  given to you by your health care provider. Make sure you discuss any questions you have with your health care provider. Document Released: 05/03/2001 Document Revised: 10/15/2015 Document Reviewed: 07/10/2012 [employer] Interactive Patient Education  2017 [employer]   Breastfeeding Deciding to breastfeed is one of the best choices you can make for you and your baby. A change in hormones during pregnancy causes your breast tissue to grow and increases the number and size of your milk ducts. These hormones also allow proteins, sugars, and fats from your blood supply to make breast milk in your milk-producing glands. Hormones prevent breast milk from being released before your baby is born as well as prompt milk flow after birth. Once breastfeeding has begun, thoughts of your baby, as well as his or her sucking or crying, can stimulate the release of milk from your milk-producing glands. Benefits of breastfeeding For Your Baby  Your first milk (colostrum) helps your baby's digestive system function better.  There are antibodies in your milk that help your baby fight off infections.  Your baby has a lower incidence of asthma, allergies, and sudden infant death syndrome.  The nutrients in breast milk are better for your baby than infant formulas and are designed uniquely for your baby's needs.  Breast milk improves your baby's brain development.  Your baby is less likely to develop other conditions, such as childhood obesity, asthma, or type 2 diabetes mellitus.  For You  Breastfeeding helps to create a very special bond between you and your baby.  Breastfeeding is convenient. Breast milk is always available at the correct temperature and costs nothing.  Breastfeeding helps to burn calories and helps you lose the weight gained during pregnancy.  Breastfeeding makes your uterus contract to its prepregnancy size faster and slows bleeding (lochia) after you give birth.  Breastfeeding helps  to lower your risk of developing type 2 diabetes mellitus, osteoporosis, and breast or ovarian cancer later in life.  Signs that your baby is hungry Early Signs of Hunger    Increased alertness or activity.  Stretching.  Movement of the head from side to side.  Movement of the head and opening of the mouth when the corner of the mouth or cheek is stroked (rooting).  Increased sucking sounds, smacking lips, cooing, sighing, or squeaking.  Hand-to-mouth movements.  Increased sucking of fingers or hands.  Late Signs of Hunger  Fussing.  Intermittent crying.  Extreme Signs of Hunger Signs of extreme hunger will require calming and consoling before your baby will be able to breastfeed successfully. Do not wait for the following signs of extreme hunger to occur before you initiate breastfeeding:  Restlessness.  A loud, strong cry.  Screaming.  Breastfeeding basics Breastfeeding Initiation  Find a comfortable place to sit or lie down, with your neck and back well supported.  Place a pillow or rolled up blanket under your baby to bring him or her to the level of your breast (if you are seated). Nursing pillows are specially designed to help support your arms and your baby while you breastfeed.  Make sure that your baby's abdomen is facing your abdomen.  Gently massage your breast. With your fingertips, massage from your chest wall toward your nipple in a circular motion. This encourages milk flow. You may need to continue this action during the feeding if your milk flows slowly.  Support your breast with 4 fingers underneath and your thumb above your nipple. Make sure your fingers are well away from your nipple and your baby's mouth.  Stroke your baby's lips gently with your finger or nipple.  When your baby's mouth is open wide enough, quickly bring your baby to your breast, placing your entire nipple and as much of the colored area around your nipple (areola) as possible into  your baby's mouth. ? More areola should be visible above your baby's upper lip than below the lower lip. ? Your baby's tongue should be between his or her lower gum and your breast.  Ensure that your baby's mouth is correctly positioned around your nipple (latched). Your baby's lips should create a seal on your breast and be turned out (everted).  It is common for your baby to suck about 2-3 minutes in order to start the flow of breast milk.  Latching Teaching your baby how to latch on to your breast properly is very important. An improper latch can cause nipple pain and decreased milk supply for you and poor weight gain in your baby. Also, if your baby is not latched onto your nipple properly, he or she may swallow some air during feeding. This can make your baby fussy. Burping your baby when you switch breasts during the feeding can help to get rid of the air. However, teaching your baby to latch on properly is still the best way to prevent fussiness from swallowing air while breastfeeding. Signs that your baby has successfully latched on to your nipple:  Silent tugging or silent sucking, without causing you pain.  Swallowing heard between every 3-4 sucks.  Muscle movement above and in front of his or her ears while sucking.  Signs that your baby has not successfully latched on to nipple:  Sucking sounds or smacking sounds from your baby while breastfeeding.  Nipple pain.  If you think your baby has not latched on correctly, slip your finger into the corner of your baby's mouth to break the suction and place it between your baby's gums. Attempt breastfeeding initiation again. Signs of Successful Breastfeeding Signs from your baby:  A   gradual decrease in the number of sucks or complete cessation of sucking.  Falling asleep.  Relaxation of his or her body.  Retention of a small amount of milk in his or her mouth.  Letting go of your breast by himself or herself.  Signs from  you:  Breasts that have increased in firmness, weight, and size 1-3 hours after feeding.  Breasts that are softer immediately after breastfeeding.  Increased milk volume, as well as a change in milk consistency and color by the fifth day of breastfeeding.  Nipples that are not sore, cracked, or bleeding.  Signs That Your Baby is Getting Enough Milk  Wetting at least 1-2 diapers during the first 24 hours after birth.  Wetting at least 5-6 diapers every 24 hours for the first week after birth. The urine should be clear or pale yellow by 5 days after birth.  Wetting 6-8 diapers every 24 hours as your baby continues to grow and develop.  At least 3 stools in a 24-hour period by age 5 days. The stool should be soft and yellow.  At least 3 stools in a 24-hour period by age 7 days. The stool should be seedy and yellow.  No loss of weight greater than 10% of birth weight during the first 3 days of age.  Average weight gain of 4-7 ounces (113-198 g) per week after age 4 days.  Consistent daily weight gain by age 5 days, without weight loss after the age of 2 weeks.  After a feeding, your baby may spit up a small amount. This is common. Breastfeeding frequency and duration Frequent feeding will help you make more milk and can prevent sore nipples and breast engorgement. Breastfeed when you feel the need to reduce the fullness of your breasts or when your baby shows signs of hunger. This is called "breastfeeding on demand." Avoid introducing a pacifier to your baby while you are working to establish breastfeeding (the first 4-6 weeks after your baby is born). After this time you may choose to use a pacifier. Research has shown that pacifier use during the first year of a baby's life decreases the risk of sudden infant death syndrome (SIDS). Allow your baby to feed on each breast as long as he or she wants. Breastfeed until your baby is finished feeding. When your baby unlatches or falls asleep  while feeding from the first breast, offer the second breast. Because newborns are often sleepy in the first few weeks of life, you may need to awaken your baby to get him or her to feed. Breastfeeding times will vary from baby to baby. However, the following rules can serve as a guide to help you ensure that your baby is properly fed:  Newborns (babies 4 weeks of age or younger) may breastfeed every 1-3 hours.  Newborns should not go longer than 3 hours during the day or 5 hours during the night without breastfeeding.  You should breastfeed your baby a minimum of 8 times in a 24-hour period until you begin to introduce solid foods to your baby at around 6 months of age.  Breast milk pumping Pumping and storing breast milk allows you to ensure that your baby is exclusively fed your breast milk, even at times when you are unable to breastfeed. This is especially important if you are going back to work while you are still breastfeeding or when you are not able to be present during feedings. Your lactation consultant can give you guidelines on how   long it is safe to store breast milk. A breast pump is a machine that allows you to pump milk from your breast into a sterile bottle. The pumped breast milk can then be stored in a refrigerator or freezer. Some breast pumps are operated by hand, while others use electricity. Ask your lactation consultant which type will work best for you. Breast pumps can be purchased, but some hospitals and breastfeeding support groups lease breast pumps on a monthly basis. A lactation consultant can teach you how to hand express breast milk, if you prefer not to use a pump. Caring for your breasts while you breastfeed Nipples can become dry, cracked, and sore while breastfeeding. The following recommendations can help keep your breasts moisturized and healthy:  Avoid using soap on your nipples.  Wear a supportive bra. Although not required, special nursing bras and tank  tops are designed to allow access to your breasts for breastfeeding without taking off your entire bra or top. Avoid wearing underwire-style bras or extremely tight bras.  Air dry your nipples for 3-4minutes after each feeding.  Use only cotton bra pads to absorb leaked breast milk. Leaking of breast milk between feedings is normal.  Use lanolin on your nipples after breastfeeding. Lanolin helps to maintain your skin's normal moisture barrier. If you use pure lanolin, you do not need to wash it off before feeding your baby again. Pure lanolin is not toxic to your baby. You may also hand express a few drops of breast milk and gently massage that milk into your nipples and allow the milk to air dry.  In the first few weeks after giving birth, some women experience extremely full breasts (engorgement). Engorgement can make your breasts feel heavy, warm, and tender to the touch. Engorgement peaks within 3-5 days after you give birth. The following recommendations can help ease engorgement:  Completely empty your breasts while breastfeeding or pumping. You may want to start by applying warm, moist heat (in the shower or with warm water-soaked hand towels) just before feeding or pumping. This increases circulation and helps the milk flow. If your baby does not completely empty your breasts while breastfeeding, pump any extra milk after he or she is finished.  Wear a snug bra (nursing or regular) or tank top for 1-2 days to signal your body to slightly decrease milk production.  Apply ice packs to your breasts, unless this is too uncomfortable for you.  Make sure that your baby is latched on and positioned properly while breastfeeding.  If engorgement persists after 48 hours of following these recommendations, contact your health care provider or a lactation consultant. Overall health care recommendations while breastfeeding  Eat healthy foods. Alternate between meals and snacks, eating 3 of each per  day. Because what you eat affects your breast milk, some of the foods may make your baby more irritable than usual. Avoid eating these foods if you are sure that they are negatively affecting your baby.  Drink milk, fruit juice, and water to satisfy your thirst (about 10 glasses a day).  Rest often, relax, and continue to take your prenatal vitamins to prevent fatigue, stress, and anemia.  Continue breast self-awareness checks.  Avoid chewing and smoking tobacco. Chemicals from cigarettes that pass into breast milk and exposure to secondhand smoke may harm your baby.  Avoid alcohol and drug use, including marijuana. Some medicines that may be harmful to your baby can pass through breast milk. It is important to ask your health care   provider before taking any medicine, including all over-the-counter and prescription medicine as well as vitamin and herbal supplements. It is possible to become pregnant while breastfeeding. If birth control is desired, ask your health care provider about options that will be safe for your baby. Contact a health care provider if:  You feel like you want to stop breastfeeding or have become frustrated with breastfeeding.  You have painful breasts or nipples.  Your nipples are cracked or bleeding.  Your breasts are red, tender, or warm.  You have a swollen area on either breast.  You have a fever or chills.  You have nausea or vomiting.  You have drainage other than breast milk from your nipples.  Your breasts do not become full before feedings by the fifth day after you give birth.  You feel sad and depressed.  Your baby is too sleepy to eat well.  Your baby is having trouble sleeping.  Your baby is wetting less than 3 diapers in a 24-hour period.  Your baby has less than 3 stools in a 24-hour period.  Your baby's skin or the white part of his or her eyes becomes yellow.  Your baby is not gaining weight by 5 days of age. Get help right away  if:  Your baby is overly tired (lethargic) and does not want to wake up and feed.  Your baby develops an unexplained fever. This information is not intended to replace advice given to you by your health care provider. Make sure you discuss any questions you have with your health care provider. Document Released: 05/09/2005 Document Revised: 10/21/2015 Document Reviewed: 10/31/2012 Elsevier Interactive Patient Education  2017 Elsevier Inc.  

## 2017-04-13 LAB — GLUCOSE TOLERANCE, 2 HOURS W/ 1HR
Glucose, 1 hour: 119 mg/dL (ref 65–179)
Glucose, 2 hour: 132 mg/dL (ref 65–152)
Glucose, Fasting: 79 mg/dL (ref 65–91)

## 2017-04-13 LAB — COMPREHENSIVE METABOLIC PANEL
ALT: 21 IU/L (ref 0–32)
AST: 18 IU/L (ref 0–40)
Albumin/Globulin Ratio: 1.1 — ABNORMAL LOW (ref 1.2–2.2)
Albumin: 3.2 g/dL — ABNORMAL LOW (ref 3.5–5.5)
Alkaline Phosphatase: 155 IU/L — ABNORMAL HIGH (ref 39–117)
BUN/Creatinine Ratio: 10 (ref 9–23)
BUN: 4 mg/dL — ABNORMAL LOW (ref 6–20)
Bilirubin Total: 0.3 mg/dL (ref 0.0–1.2)
CO2: 19 mmol/L — ABNORMAL LOW (ref 20–29)
Calcium: 8 mg/dL — ABNORMAL LOW (ref 8.7–10.2)
Chloride: 107 mmol/L — ABNORMAL HIGH (ref 96–106)
Creatinine, Ser: 0.42 mg/dL — ABNORMAL LOW (ref 0.57–1.00)
GFR calc Af Amer: 161 mL/min/{1.73_m2} (ref >59)
GFR calc non Af Amer: 140 mL/min/{1.73_m2} (ref >59)
Globulin, Total: 3 g/dL (ref 1.5–4.5)
Glucose: 80 mg/dL (ref 65–99)
Potassium: 3.9 mmol/L (ref 3.5–5.2)
Sodium: 139 mmol/L (ref 134–144)
Total Protein: 6.2 g/dL (ref 6.0–8.5)

## 2017-04-13 LAB — HIV ANTIBODY (ROUTINE TESTING W REFLEX): HIV Screen 4th Generation wRfx: NONREACTIVE

## 2017-04-13 LAB — RPR: RPR Ser Ql: NONREACTIVE

## 2017-04-17 ENCOUNTER — Encounter: Payer: Self-pay | Admitting: *Deleted

## 2017-04-20 ENCOUNTER — Ambulatory Visit (HOSPITAL_COMMUNITY)
Admission: RE | Admit: 2017-04-20 | Payer: Medicaid Other | Source: Ambulatory Visit | Attending: Obstetrics and Gynecology | Admitting: Obstetrics and Gynecology

## 2017-04-26 ENCOUNTER — Encounter: Payer: Self-pay | Admitting: Student

## 2017-04-26 ENCOUNTER — Ambulatory Visit (INDEPENDENT_AMBULATORY_CARE_PROVIDER_SITE_OTHER): Payer: Medicaid Other | Admitting: Student

## 2017-04-26 VITALS — BP 135/83 | HR 80 | Wt 268.0 lb

## 2017-04-26 DIAGNOSIS — O0993 Supervision of high risk pregnancy, unspecified, third trimester: Secondary | ICD-10-CM

## 2017-04-26 DIAGNOSIS — O0990 Supervision of high risk pregnancy, unspecified, unspecified trimester: Secondary | ICD-10-CM

## 2017-04-26 DIAGNOSIS — Z23 Encounter for immunization: Secondary | ICD-10-CM

## 2017-04-26 DIAGNOSIS — O219 Vomiting of pregnancy, unspecified: Secondary | ICD-10-CM

## 2017-04-26 MED ORDER — DOXYLAMINE-PYRIDOXINE ER 20-20 MG PO TBCR: 1.0000 | EXTENDED_RELEASE_TABLET | Freq: Every day | ORAL | 3 refills | Status: DC

## 2017-04-26 NOTE — Patient Instructions (Signed)
Eating Plan for Pregnant Women While you are pregnant, your body will require additional nutrition to help support your growing baby. It is recommended that you consume:  150 additional calories each day during your first trimester.  300 additional calories each day during your second trimester.  300 additional calories each day during your third trimester.  Eating a healthy, well-balanced diet is very important for your health and for your baby's health. You also have a higher need for some vitamins and minerals, such as folic acid, calcium, iron, and vitamin D. What do I need to know about eating during pregnancy?  Do not try to lose weight or go on a diet during pregnancy.  Choose healthy, nutritious foods. Choose  of a sandwich with a glass of milk instead of a candy bar or a high-calorie sugar-sweetened beverage.  Limit your overall intake of foods that have "empty calories." These are foods that have little nutritional value, such as sweets, desserts, candies, sugar-sweetened beverages, and fried foods.  Eat a variety of foods, especially fruits and vegetables.  Take a prenatal vitamin to help meet the additional needs during pregnancy, specifically for folic acid, iron, calcium, and vitamin D.  Remember to stay active. Ask your health care provider for exercise recommendations that are specific to you.  Practice good food safety and cleanliness, such as washing your hands before you eat and after you prepare raw meat. This helps to prevent foodborne illnesses, such as listeriosis, that can be very dangerous for your baby. Ask your health care provider for more information about listeriosis. What does 150 extra calories look like? Healthy options for an additional 150 calories each day could be any of the following:  Plain low-fat yogurt (6-8 oz) with  cup of berries.  1 apple with 2 teaspoons of peanut butter.  Cut-up vegetables with  cup of hummus.  Low-fat chocolate milk  (8 oz or 1 cup).  1 string cheese with 1 medium orange.   of a peanut butter and jelly sandwich on whole-wheat bread (1 tsp of peanut butter).  For 300 calories, you could eat two of those healthy options each day. What is a healthy amount of weight to gain? The recommended amount of weight for you to gain is based on your pre-pregnancy BMI. If your pre-pregnancy BMI was:  Less than 18 (underweight), you should gain 28-40 lb.  18-24.9 (normal), you should gain 25-35 lb.  25-29.9 (overweight), you should gain 15-25 lb.  Greater than 30 (obese), you should gain 11-20 lb.  What if I am having twins or multiples? Generally, pregnant women who will be having twins or multiples may need to increase their daily calories by 300-600 calories each day. The recommended range for total weight gain is 25-54 lb, depending on your pre-pregnancy BMI. Talk with your health care provider for specific guidance about additional nutritional needs, weight gain, and exercise during your pregnancy. What foods can I eat? Grains Any grains. Try to choose whole grains, such as whole-wheat bread, oatmeal, or brown rice. Vegetables Any vegetables. Try to eat a variety of colors and types of vegetables to get a full range of vitamins and minerals. Remember to wash your vegetables well before eating. Fruits Any fruits. Try to eat a variety of colors and types of fruit to get a full range of vitamins and minerals. Remember to wash your fruits well before eating. Meats and Other Protein Sources Lean meats, including chicken, [place], fish, and lean cuts of beef, veal,  or pork. Make sure that all meats are cooked to "well done." Tofu. Tempeh. Beans. Eggs. Peanut butter and other nut butters. Seafood, such as shrimp, crab, and lobster. If you choose fish, select types that are higher in omega-3 fatty acids, including salmon, herring, mussels, trout, sardines, and pollock. Make sure that all meats are cooked to  food-safe temperatures. Dairy Pasteurized milk and milk alternatives. Pasteurized yogurt and pasteurized cheese. Cottage cheese. Sour cream. Beverages Water. Juices that contain 100% fruit juice or vegetable juice. Caffeine-free teas and decaffeinated coffee. Drinks that contain caffeine are okay to drink, but it is better to avoid caffeine. Keep your total caffeine intake to less than 200 mg each day (12 oz of coffee, tea, or soda) or as directed by your health care provider. Condiments Any pasteurized condiments. Sweets and Desserts Any sweets and desserts. Fats and Oils Any fats and oils. The items listed above may not be a complete list of recommended foods or beverages. Contact your dietitian for more options. What foods are not recommended? Vegetables Unpasteurized (raw) vegetable juices. Fruits Unpasteurized (raw) fruit juices. Meats and Other Protein Sources Cured meats that have nitrates, such as bacon, salami, and hotdogs. Luncheon meats, bologna, or other deli meats (unless they are reheated until they are steaming hot). Refrigerated pate, meat spreads from a meat counter, smoked seafood that is found in the refrigerated section of a store. Raw fish, such as sushi or sashimi. High mercury content fish, such as tilefish, shark, swordfish, and king mackerel. Raw meats, such as tuna or beef tartare. Undercooked meats and poultry. Make sure that all meats are cooked to food-safe temperatures. Dairy Unpasteurized (raw) milk and any foods that have raw milk in them. Soft cheeses, such as feta, queso blanco, queso fresco, Brie, Camembert cheeses, blue-veined cheeses, and Panela cheese (unless it is made with pasteurized milk, which must be stated on the label). Beverages Alcohol. Sugar-sweetened beverages, such as sodas, teas, or energy drinks. Condiments Homemade fermented foods and drinks, such as pickles, sauerkraut, or kombucha drinks. (Store-bought pasteurized versions of these are  okay.) Other Salads that are made in the store, such as ham salad, chicken salad, egg salad, tuna salad, and seafood salad. The items listed above may not be a complete list of foods and beverages to avoid. Contact your dietitian for more information. This information is not intended to replace advice given to you by your health care provider. Make sure you discuss any questions you have with your health care provider. Document Released: 02/21/2014 Document Revised: 10/15/2015 Document Reviewed: 10/22/2013 Elsevier Interactive Patient Education  2018 Elsevier Inc.   

## 2017-04-27 ENCOUNTER — Encounter: Payer: Self-pay | Admitting: Student

## 2017-04-27 ENCOUNTER — Ambulatory Visit (HOSPITAL_COMMUNITY)
Admission: RE | Admit: 2017-04-27 | Discharge: 2017-04-27 | Disposition: A | Discharge status: Home / Self Care | Payer: Medicaid Other | Source: Ambulatory Visit | Attending: Obstetrics & Gynecology | Admitting: Obstetrics & Gynecology

## 2017-04-27 ENCOUNTER — Encounter (HOSPITAL_COMMUNITY): Payer: Self-pay

## 2017-04-27 ENCOUNTER — Other Ambulatory Visit (HOSPITAL_COMMUNITY): Payer: Self-pay | Admitting: Obstetrics and Gynecology

## 2017-04-27 DIAGNOSIS — Z3A30 30 weeks gestation of pregnancy: Secondary | ICD-10-CM

## 2017-04-27 DIAGNOSIS — O99213 Obesity complicating pregnancy, third trimester: Secondary | ICD-10-CM | POA: Insufficient documentation

## 2017-04-27 DIAGNOSIS — O99210 Obesity complicating pregnancy, unspecified trimester: Secondary | ICD-10-CM

## 2017-04-27 DIAGNOSIS — O26843 Uterine size-date discrepancy, third trimester: Secondary | ICD-10-CM

## 2017-04-27 DIAGNOSIS — O0990 Supervision of high risk pregnancy, unspecified, unspecified trimester: Secondary | ICD-10-CM

## 2017-04-27 DIAGNOSIS — O403XX0 Polyhydramnios, third trimester, not applicable or unspecified: Secondary | ICD-10-CM | POA: Insufficient documentation

## 2017-04-27 NOTE — Progress Notes (Signed)
   PRENATAL VISIT NOTE  Subjective:  Sherry Spence is a 26 y.o. G3P2002 at [redacted]w[redacted]d being seen today for ongoing prenatal care.  She is currently monitored for the following issues for this high-risk pregnancy and has BMI 40.0-44.9, adult ([place]); Obesity in pregnancy; Supervision of high risk pregnancy, antepartum; Short interval between pregnancies affecting pregnancy in first trimester, antepartum; and Transaminitis on their problem list.  Patient reports no complaints.  Contractions: Irregular. Vag. Bleeding: None.  Movement: Present. Denies leaking of fluid.   The following portions of the patient's history were reviewed and updated as appropriate: allergies, current medications, past family history, past medical history, past social history, past surgical history and problem list. Problem list updated.  Objective:   Vitals:   04/26/17 1544  BP: 135/83  Pulse: 80  Weight: 268 lb (121.6 kg)    Fetal Status: Fetal Heart Rate (bpm): 140 Fundal Height: 34 cm Movement: Present     General:  Alert, oriented and cooperative. Patient is in no acute distress.  Skin: Skin is warm and dry. No rash noted.   Cardiovascular: Normal heart rate noted  Respiratory: Normal respiratory effort, no problems with respiration noted  Abdomen: Soft, gravid, appropriate for gestational age.  Pain/Pressure: Present     Pelvic: Cervical exam deferred        Extremities: Normal range of motion.  Edema: None  Mental Status:  Normal mood and affect. Normal behavior. Normal judgment and thought content.   Assessment and Plan:  Pregnancy: G3P2002 at [redacted]w[redacted]d  1. Supervision of high risk pregnancy, antepartum Doing well; Size > dates but patient has growth [place] scheduled for 04-27-2017.   2. Immunization due  - Tdap vaccine greater than or equal to 7yo IM  3. Nausea and vomiting of pregnancy, antepartum Reviewed healthy eating in pregnancy in order to stay full without gaining weight (high protein diet, etc).    - Doxylamine-Pyridoxine ER (BONJESTA) 20-20 MG TBCR; Take 1 tablet by mouth at bedtime. May take additional tab midday if needed  Dispense: 60 tablet; Refill: 3  Preterm labor symptoms and general obstetric precautions including but not limited to vaginal bleeding, contractions, leaking of fluid and fetal movement were reviewed in detail with the patient. Please refer to After Visit Summary for other counseling recommendations.  Return in about 2 weeks (around 05/10/2017).   Starr Lake, CNM

## 2017-04-28 ENCOUNTER — Other Ambulatory Visit (HOSPITAL_COMMUNITY): Payer: Self-pay | Admitting: *Deleted

## 2017-04-28 DIAGNOSIS — O409XX0 Polyhydramnios, unspecified trimester, not applicable or unspecified: Secondary | ICD-10-CM

## 2017-05-05 ENCOUNTER — Ambulatory Visit (HOSPITAL_COMMUNITY): Admission: RE | Admit: 2017-05-05 | Payer: Medicaid Other | Source: Ambulatory Visit

## 2017-05-08 ENCOUNTER — Ambulatory Visit (HOSPITAL_COMMUNITY)
Admission: RE | Admit: 2017-05-08 | Discharge: 2017-05-08 | Disposition: A | Discharge status: Home / Self Care | Payer: Medicaid Other | Source: Ambulatory Visit | Attending: Obstetrics & Gynecology | Admitting: Obstetrics & Gynecology

## 2017-05-08 ENCOUNTER — Encounter (HOSPITAL_COMMUNITY): Payer: Self-pay

## 2017-05-08 DIAGNOSIS — O403XX0 Polyhydramnios, third trimester, not applicable or unspecified: Secondary | ICD-10-CM | POA: Diagnosis present

## 2017-05-08 DIAGNOSIS — Z6841 Body Mass Index (BMI) 40.0 and over, adult: Secondary | ICD-10-CM | POA: Insufficient documentation

## 2017-05-08 DIAGNOSIS — E669 Obesity, unspecified: Secondary | ICD-10-CM | POA: Diagnosis not present

## 2017-05-08 DIAGNOSIS — O99213 Obesity complicating pregnancy, third trimester: Secondary | ICD-10-CM | POA: Diagnosis not present

## 2017-05-08 DIAGNOSIS — Z3A32 32 weeks gestation of pregnancy: Secondary | ICD-10-CM | POA: Insufficient documentation

## 2017-05-08 DIAGNOSIS — O409XX0 Polyhydramnios, unspecified trimester, not applicable or unspecified: Secondary | ICD-10-CM

## 2017-05-08 NOTE — Addendum Note (Signed)
Encounter addended by: Jeanene Erb on: 05/08/2017 11:39 AM  Actions taken: Imaging Exam ended

## 2017-05-09 ENCOUNTER — Encounter: Payer: Self-pay | Admitting: Obstetrics and Gynecology

## 2017-05-09 ENCOUNTER — Encounter: Payer: Medicaid Other | Admitting: Obstetrics and Gynecology

## 2017-05-09 NOTE — Progress Notes (Signed)
Patient did not keep OB appointment for 05/09/2017.  Durene Romans MD Attending Center for [employer] [facility])

## 2017-05-12 ENCOUNTER — Other Ambulatory Visit (HOSPITAL_COMMUNITY): Payer: Self-pay | Admitting: Maternal and Fetal Medicine

## 2017-05-12 ENCOUNTER — Ambulatory Visit (HOSPITAL_COMMUNITY)
Admission: RE | Admit: 2017-05-12 | Discharge: 2017-05-12 | Disposition: A | Discharge status: Home / Self Care | Payer: Medicaid Other | Source: Ambulatory Visit | Attending: Obstetrics & Gynecology | Admitting: Obstetrics & Gynecology

## 2017-05-12 ENCOUNTER — Encounter (HOSPITAL_COMMUNITY): Payer: Self-pay

## 2017-05-12 DIAGNOSIS — O409XX0 Polyhydramnios, unspecified trimester, not applicable or unspecified: Secondary | ICD-10-CM

## 2017-05-12 DIAGNOSIS — Z3A32 32 weeks gestation of pregnancy: Secondary | ICD-10-CM | POA: Diagnosis not present

## 2017-05-12 DIAGNOSIS — O0990 Supervision of high risk pregnancy, unspecified, unspecified trimester: Secondary | ICD-10-CM

## 2017-05-12 DIAGNOSIS — E669 Obesity, unspecified: Secondary | ICD-10-CM | POA: Insufficient documentation

## 2017-05-12 DIAGNOSIS — O99213 Obesity complicating pregnancy, third trimester: Secondary | ICD-10-CM

## 2017-05-12 DIAGNOSIS — O403XX0 Polyhydramnios, third trimester, not applicable or unspecified: Secondary | ICD-10-CM | POA: Insufficient documentation

## 2017-05-17 ENCOUNTER — Ambulatory Visit (INDEPENDENT_AMBULATORY_CARE_PROVIDER_SITE_OTHER): Payer: Medicaid Other | Admitting: Obstetrics and Gynecology

## 2017-05-17 VITALS — BP 133/84 | HR 90 | Wt 267.0 lb

## 2017-05-17 DIAGNOSIS — O99210 Obesity complicating pregnancy, unspecified trimester: Secondary | ICD-10-CM

## 2017-05-17 DIAGNOSIS — Z6841 Body Mass Index (BMI) 40.0 and over, adult: Secondary | ICD-10-CM

## 2017-05-17 DIAGNOSIS — O0990 Supervision of high risk pregnancy, unspecified, unspecified trimester: Secondary | ICD-10-CM

## 2017-05-17 DIAGNOSIS — R7401 Elevation of levels of liver transaminase levels: Secondary | ICD-10-CM

## 2017-05-17 DIAGNOSIS — O403XX0 Polyhydramnios, third trimester, not applicable or unspecified: Secondary | ICD-10-CM

## 2017-05-17 DIAGNOSIS — R740 Nonspecific elevation of levels of transaminase and lactic acid dehydrogenase [LDH]: Secondary | ICD-10-CM

## 2017-05-17 DIAGNOSIS — O0993 Supervision of high risk pregnancy, unspecified, third trimester: Secondary | ICD-10-CM

## 2017-05-17 DIAGNOSIS — O99213 Obesity complicating pregnancy, third trimester: Secondary | ICD-10-CM

## 2017-05-17 NOTE — Progress Notes (Signed)
Prenatal Visit Note Date: 05/17/2017 Clinic: [facility]  Subjective:  Sherry Spence is a 26 y.o. [id number] at [redacted]w[redacted]d being seen today for ongoing prenatal care.  She is currently monitored for the following issues for this high-risk pregnancy and has BMI 40.0-44.9, adult ([place]); Obesity in pregnancy; Supervision of high risk pregnancy, antepartum; Short interval between pregnancies affecting pregnancy in first trimester, antepartum; and Polyhydramnios affecting pregnancy in third trimester on their problem list.  Patient reports no complaints.   Contractions: Irregular. Vag. Bleeding: None.  Movement: Present. Denies leaking of fluid.   The following portions of the patient's history were reviewed and updated as appropriate: allergies, current medications, past family history, past medical history, past social history, past surgical history and problem list. Problem list updated.  Objective:   Vitals:   05/17/17 1512  BP: 133/84  Pulse: 90  Weight: 267 lb (121.1 kg)    Fetal Status: Fetal Heart Rate (bpm): 141   Movement: Present     General:  Alert, oriented and cooperative. Patient is in no acute distress.  Skin: Skin is warm and dry. No rash noted.   Cardiovascular: Normal heart rate noted  Respiratory: Normal respiratory effort, no problems with respiration noted  Abdomen: Soft, gravid, appropriate for gestational age. Pain/Pressure: Present     Pelvic:  Cervical exam deferred        Extremities: Normal range of motion.  Edema: None  Mental Status: Normal mood and affect. Normal behavior. Normal judgment and thought content.   Urinalysis:      Assessment and Plan:  Pregnancy: G3P2002 at [redacted]w[redacted]d  1. Supervision of high risk pregnancy, antepartum Routine care. btl forms already signed.   2. Transaminitis resolved  3. Polyhydramnios affecting pregnancy in third trimester F/u qwk bpp for later this week. Idiopathic at this point  4. Obesity in  pregnancy  5. BMI 40.0-44.9, adult ([place])  Preterm labor symptoms and general obstetric precautions including but not limited to vaginal bleeding, contractions, leaking of fluid and fetal movement were reviewed in detail with the patient. Please refer to After Visit Summary for other counseling recommendations.  Return in about 2 weeks (around 05/31/2017) for rob.   Aletha Halim, MD

## 2017-05-19 ENCOUNTER — Encounter (HOSPITAL_COMMUNITY): Payer: Self-pay

## 2017-05-19 ENCOUNTER — Ambulatory Visit (HOSPITAL_COMMUNITY)
Admission: RE | Admit: 2017-05-19 | Discharge: 2017-05-19 | Disposition: A | Discharge status: Home / Self Care | Payer: Medicaid Other | Source: Ambulatory Visit | Attending: Obstetrics & Gynecology | Admitting: Obstetrics & Gynecology

## 2017-05-19 ENCOUNTER — Other Ambulatory Visit (HOSPITAL_COMMUNITY): Payer: Self-pay | Admitting: Maternal and Fetal Medicine

## 2017-05-19 ENCOUNTER — Ambulatory Visit (HOSPITAL_COMMUNITY): Admission: RE | Admit: 2017-05-19 | Payer: Medicaid Other | Source: Ambulatory Visit

## 2017-05-19 ENCOUNTER — Other Ambulatory Visit (HOSPITAL_COMMUNITY): Payer: Medicaid Other

## 2017-05-19 DIAGNOSIS — O409XX0 Polyhydramnios, unspecified trimester, not applicable or unspecified: Secondary | ICD-10-CM

## 2017-05-19 DIAGNOSIS — O0990 Supervision of high risk pregnancy, unspecified, unspecified trimester: Secondary | ICD-10-CM

## 2017-05-19 DIAGNOSIS — O99213 Obesity complicating pregnancy, third trimester: Secondary | ICD-10-CM | POA: Diagnosis not present

## 2017-05-19 DIAGNOSIS — Z3A33 33 weeks gestation of pregnancy: Secondary | ICD-10-CM

## 2017-05-19 NOTE — Addendum Note (Signed)
Encounter addended by: Novella Rob, [place] on: 05/19/2017 2:58 PM  Actions taken: Imaging Exam ended

## 2017-05-23 NOTE — L&D Delivery Note (Signed)
Delivery Note At 7:19 AM a viable female was delivered via Vaginal, Spontaneous (Presentation: vertex; OA). Head delivered OA. No nuchal cord present. Shoulder and body delivered in usual fashion. Infant placed on mother's abdomen, dried and bulb suctioned. Cord clamped x 2 after 1-minute delay, and cut by family member. Cord blood drawn. After 1 minute, the cord was clamped and cut. 40 units of pitocin diluted in 1000cc LR was infused rapidly IV.  The placenta separated spontaneously and delivered via CCT and maternal pushing effort.  It was inspected and appears to be intact with a 3 VC.     APGAR: 9, 9; weight pending.   Placenta status: intact, sent to L&D.  Cord: 3V without complications.  Cord pH: not sent.  Anesthesia: epidural  Episiotomy: None Lacerations:  n/a Suture Repair: n/a Est. Blood Loss (mL): 100   Mom to postpartum.  Baby to Couplet care / Skin to Skin.  Larwance Rote 06/22/2017, 7:31 AM

## 2017-05-26 ENCOUNTER — Other Ambulatory Visit (HOSPITAL_COMMUNITY): Payer: Self-pay | Admitting: *Deleted

## 2017-05-26 ENCOUNTER — Encounter (HOSPITAL_COMMUNITY): Payer: Self-pay

## 2017-05-26 ENCOUNTER — Ambulatory Visit (HOSPITAL_COMMUNITY)
Admission: RE | Admit: 2017-05-26 | Discharge: 2017-05-26 | Disposition: A | Discharge status: Home / Self Care | Payer: Medicaid Other | Source: Ambulatory Visit | Attending: Obstetrics & Gynecology | Admitting: Obstetrics & Gynecology

## 2017-05-26 ENCOUNTER — Other Ambulatory Visit (HOSPITAL_COMMUNITY): Payer: Self-pay | Admitting: Maternal and Fetal Medicine

## 2017-05-26 DIAGNOSIS — Z3A34 34 weeks gestation of pregnancy: Secondary | ICD-10-CM

## 2017-05-26 DIAGNOSIS — O409XX0 Polyhydramnios, unspecified trimester, not applicable or unspecified: Secondary | ICD-10-CM

## 2017-05-26 DIAGNOSIS — O403XX0 Polyhydramnios, third trimester, not applicable or unspecified: Secondary | ICD-10-CM | POA: Diagnosis not present

## 2017-05-26 DIAGNOSIS — O99213 Obesity complicating pregnancy, third trimester: Secondary | ICD-10-CM | POA: Diagnosis not present

## 2017-06-01 ENCOUNTER — Telehealth: Payer: Self-pay

## 2017-06-01 NOTE — Telephone Encounter (Signed)
Called patient-regarding sharp pelvic pain. No answer or voice mail.

## 2017-06-01 NOTE — Telephone Encounter (Signed)
-----   Message from Blanchie Dessert, [place] sent at 06/01/2017  9:47 AM EST ----- Regarding: patient having sharpe pain in pelvic bone Contact: [phone] Please call patient, she is experiencing sharp pelvic bone pain since about 6 am this morning.

## 2017-06-02 ENCOUNTER — Other Ambulatory Visit (HOSPITAL_COMMUNITY): Payer: Self-pay | Admitting: Obstetrics and Gynecology

## 2017-06-02 ENCOUNTER — Ambulatory Visit (INDEPENDENT_AMBULATORY_CARE_PROVIDER_SITE_OTHER): Payer: Medicaid Other | Admitting: Obstetrics and Gynecology

## 2017-06-02 ENCOUNTER — Encounter (HOSPITAL_COMMUNITY): Payer: Self-pay

## 2017-06-02 ENCOUNTER — Encounter: Payer: Self-pay | Admitting: Obstetrics and Gynecology

## 2017-06-02 ENCOUNTER — Ambulatory Visit (HOSPITAL_COMMUNITY)
Admission: RE | Admit: 2017-06-02 | Discharge: 2017-06-02 | Disposition: A | Discharge status: Home / Self Care | Payer: Medicaid Other | Source: Ambulatory Visit | Attending: Obstetrics & Gynecology | Admitting: Obstetrics & Gynecology

## 2017-06-02 DIAGNOSIS — O403XX0 Polyhydramnios, third trimester, not applicable or unspecified: Secondary | ICD-10-CM | POA: Diagnosis not present

## 2017-06-02 DIAGNOSIS — O99213 Obesity complicating pregnancy, third trimester: Secondary | ICD-10-CM | POA: Diagnosis not present

## 2017-06-02 DIAGNOSIS — Z3A35 35 weeks gestation of pregnancy: Secondary | ICD-10-CM | POA: Insufficient documentation

## 2017-06-02 DIAGNOSIS — E669 Obesity, unspecified: Secondary | ICD-10-CM | POA: Diagnosis not present

## 2017-06-02 DIAGNOSIS — O0990 Supervision of high risk pregnancy, unspecified, unspecified trimester: Secondary | ICD-10-CM

## 2017-06-02 DIAGNOSIS — O0993 Supervision of high risk pregnancy, unspecified, third trimester: Secondary | ICD-10-CM

## 2017-06-02 DIAGNOSIS — O409XX0 Polyhydramnios, unspecified trimester, not applicable or unspecified: Secondary | ICD-10-CM

## 2017-06-02 MED ORDER — "ABDOMINAL BINDER 10"" MISC": 1.0000 [IU] | 0 refills | Status: DC | PRN

## 2017-06-02 NOTE — Progress Notes (Signed)
Prenatal Visit Note Date: 06/02/2017 Clinic: [facility]  Subjective:  Sherry Spence is a 27 y.o. [id number] at [redacted]w[redacted]d being seen today for ongoing prenatal care.  She is currently monitored for the following issues for this high-risk pregnancy and has BMI 40.0-44.9, adult ([place]); Obesity in pregnancy; Supervision of high risk pregnancy, antepartum; Short interval between pregnancies affecting pregnancy in first trimester, antepartum; and Polyhydramnios affecting pregnancy in third trimester on their problem list.  Patient reports belly pressure and discomfort.   Contractions: Irregular. Vag. Bleeding: Other.  Movement: Present. Denies leaking of fluid.   The following portions of the patient's history were reviewed and updated as appropriate: allergies, current medications, past family history, past medical history, past social history, past surgical history and problem list. Problem list updated.  Objective:   Vitals:   06/02/17 0853  BP: 127/87  Pulse: 91  Weight: 266 lb (120.7 kg)    Fetal Status: Fetal Heart Rate (bpm): NST   Movement: Present     General:  Alert, oriented and cooperative. Patient is in no acute distress.  Skin: Skin is warm and dry. No rash noted.   Cardiovascular: Normal heart rate noted  Respiratory: Normal respiratory effort, no problems with respiration noted  Abdomen: Soft, gravid, appropriate for gestational age. Pain/Pressure: Present     Pelvic:  Cervical exam deferred        Extremities: Normal range of motion.  Edema: None  Mental Status: Normal mood and affect. Normal behavior. Normal judgment and thought content.   Urinalysis:      Assessment and Plan:  Pregnancy: G3P2002 at [redacted]w[redacted]d  1. Polyhydramnios affecting pregnancy in third trimester Resolved last week; mfm recommended continuing. rNST today and has bpp later today with mfm  2. Supervision of high risk pregnancy, antepartum Routine care. btl paper signed. Recommended  belly binder as pt states she holds her belly a lot during the day. gbs nv. F/u u/s from today for delivery planning.  Preterm labor symptoms and general obstetric precautions including but not limited to vaginal bleeding, contractions, leaking of fluid and fetal movement were reviewed in detail with the patient. Please refer to After Visit Summary for other counseling recommendations.  Return in about 1 week (around 06/09/2017) for nst/rob/gbs swab.   Aletha Halim, MD

## 2017-06-02 NOTE — Progress Notes (Signed)
Pt states she had pink vaginal discharge last night but has not seen any today. She states she has sharp pain on right side lower abdominal and right side gluteal pain.

## 2017-06-09 ENCOUNTER — Encounter: Payer: Self-pay | Admitting: Obstetrics & Gynecology

## 2017-06-09 ENCOUNTER — Ambulatory Visit (INDEPENDENT_AMBULATORY_CARE_PROVIDER_SITE_OTHER): Payer: Medicaid Other | Admitting: Obstetrics & Gynecology

## 2017-06-09 ENCOUNTER — Ambulatory Visit (HOSPITAL_COMMUNITY): Admission: RE | Admit: 2017-06-09 | Payer: Medicaid Other | Source: Ambulatory Visit

## 2017-06-09 ENCOUNTER — Other Ambulatory Visit (HOSPITAL_COMMUNITY)
Admission: RE | Admit: 2017-06-09 | Discharge: 2017-06-09 | Disposition: A | Discharge status: Home / Self Care | Payer: Medicaid Other | Source: Ambulatory Visit | Attending: Obstetrics & Gynecology | Admitting: Obstetrics & Gynecology

## 2017-06-09 VITALS — BP 116/74 | HR 76 | Wt 266.4 lb

## 2017-06-09 DIAGNOSIS — O0993 Supervision of high risk pregnancy, unspecified, third trimester: Secondary | ICD-10-CM | POA: Diagnosis not present

## 2017-06-09 DIAGNOSIS — O403XX0 Polyhydramnios, third trimester, not applicable or unspecified: Secondary | ICD-10-CM | POA: Diagnosis not present

## 2017-06-09 DIAGNOSIS — Z3A36 36 weeks gestation of pregnancy: Secondary | ICD-10-CM | POA: Insufficient documentation

## 2017-06-09 DIAGNOSIS — O0990 Supervision of high risk pregnancy, unspecified, unspecified trimester: Secondary | ICD-10-CM | POA: Diagnosis present

## 2017-06-09 NOTE — Patient Instructions (Signed)
Return to clinic for any scheduled appointments or obstetric concerns, or go to MAU for evaluation  

## 2017-06-09 NOTE — Progress Notes (Signed)
PRENATAL VISIT NOTE  Subjective:  Sherry Spence is a 27 y.o. G3P2002 at [redacted]w[redacted]d being seen today for ongoing prenatal care.  She is currently monitored for the following issues for this high-risk pregnancy and has BMI 40.0-44.9, adult ([place]); Obesity in pregnancy; Supervision of high risk pregnancy, antepartum; Short interval between pregnancies affecting pregnancy in first trimester, antepartum; and Polyhydramnios affecting pregnancy in third trimester on their problem list.  Patient reports no complaints.  Contractions: Irregular. Vag. Bleeding: None.  Movement: Present. Denies leaking of fluid.   The following portions of the patient's history were reviewed and updated as appropriate: allergies, current medications, past family history, past medical history, past social history, past surgical history and problem list. Problem list updated.  Objective:   Vitals:   06/09/17 0844  BP: 116/74  Pulse: 76  Weight: 266 lb 6.4 oz (120.8 kg)    Fetal Status: Fetal Heart Rate (bpm): NST Fundal Height: 37 cm Movement: Present  Presentation: Vertex  General:  Alert, oriented and cooperative. Patient is in no acute distress.  Skin: Skin is warm and dry. No rash noted.   Cardiovascular: Normal heart rate noted  Respiratory: Normal respiratory effort, no problems with respiration noted  Abdomen: Soft, gravid, appropriate for gestational age.  Pain/Pressure: Present     Pelvic: Cervical exam deferred Dilation: 1.5 Effacement (%): Thick Station: -3  Extremities: Normal range of motion.  Edema: Trace  Mental Status:  Normal mood and affect. Normal behavior. Normal judgment and thought content.   [place] Mfm Fetal Bpp Wo Non Stress  Result Date: 06/04/2017 ----------------------------------------------------------------------  OBSTETRICS REPORT                       (Signed Final 06/04/2017 05:51 pm) ---------------------------------------------------------------------- Patient Info  ID #:        [id number]                          D.O.B.:  1990-08-20 (26 yrs)  Name:       Sherry Spence             Visit Date: 06/02/2017 12:58 pm ---------------------------------------------------------------------- Performed By  Performed By:     Sherry Spence        Ref. Address:      [place]                    Sherry Spence  [place]  Attending:        Renella Cunas MD       Location:          [facility]  Referred By:      Sherry Halim MD ---------------------------------------------------------------------- Orders   #  Description                                 Code   1  [place] MFM FETAL BPP WO NON STRESS              76819.01   2  [place] MFM UA CORD DOPPLER                      19417.40  ----------------------------------------------------------------------   #  Ordered By               Order #        Accession #    Episode #   Sherry Spence              [id number]      [id number]     [id number]   2  Sherry Spence              [id number]      [id number]     [id number]  ---------------------------------------------------------------------- Indications   [redacted] weeks gestation of pregnancy                Z3A.35   Polyhydramnios, third trimester, antepartum    O40.3XX0   condition or complication, unspecified fetus   Obesity complicating pregnancy, third          O99.213   trimester  ---------------------------------------------------------------------- OB History  Blood Type:            Height:  5'6"   Weight (lb):  262       BMI:  42.28  Gravidity:    3         Term:   2  Living:       2 ---------------------------------------------------------------------- Fetal Evaluation  Num Of Fetuses:     1  Fetal Heart         161  Rate(bpm):  Cardiac Activity:   Observed  Presentation:       Cephalic  Amniotic Fluid   AFI FV:      Subjectively within normal limits  AFI Sum(cm)     %Tile       Largest Pocket(cm)  23.15           88          7.41  RUQ(cm)       RLQ(cm)       LUQ(cm)        LLQ(cm)  4.62          6.71          4.41           7.41 ---------------------------------------------------------------------- Biophysical Evaluation  Amniotic F.V:   Pocket => 2 cm two         F. Tone:         Observed                  planes  F. Movement:    Observed  Score:           8/8  F. Breathing:   Observed ---------------------------------------------------------------------- Gestational Age  Best:          35w 5d     Det. ByLoman Chroman         EDD:    07/02/17                                      (11/07/16) ---------------------------------------------------------------------- Doppler - Fetal Vessels  Umbilical Artery   S/D     %tile     RI              PI               PSV   ADFV    RDFV                                                   (cm/s)  2.16        33   0.54            0.78             52.53      No       No ---------------------------------------------------------------------- Impression  SIUP at 35+5 weeks  High normal amniotic fluid volume  BPP 8/8  UA dopplers were normal for this GA ---------------------------------------------------------------------- Recommendations  If AFV is within normal limits next week, can discontinue  testing ----------------------------------------------------------------------                 Sherry Cunas, MD Electronically Signed Final Report   06/04/2017 05:51 pm ----------------------------------------------------------------------  [place] Mfm Fetal Bpp Wo Non Stress  Result Date: 05/26/2017 ----------------------------------------------------------------------  OBSTETRICS REPORT                      (Signed Final 05/26/2017 11:18 am) ---------------------------------------------------------------------- Patient Info  ID #:       [id number]                           D.O.B.:  01-Nov-1990 (26 yrs)  Name:       Sherry Spence             Visit Date: 05/26/2017 10:35 am ---------------------------------------------------------------------- Performed By  Performed By:     Raquel James         Ref. Address:     [address]  [place], [place]  Attending:        Seward Meth MD         Location:         [facility]  Referred By:      Sherry Halim MD ---------------------------------------------------------------------- Orders   #  Description                                 Code   1  [place] MFM OB FOLLOW UP                         [phone]   2  [place] MFM FETAL BPP WO NON STRESS              76819.01  ----------------------------------------------------------------------   #  Ordered By               Order #        Accession #    Episode #   1  Sherry Spence            [id number]      [id number]     [id number]   2  [place]            [id number]      [id number]     [id number]  ---------------------------------------------------------------------- Indications   [redacted] weeks gestation of pregnancy                Z3A.34   Polyhydramnios, third trimester, antepartum    O40.3XX0   condition or complication, unspecified fetus   Obesity complicating pregnancy, third          O99.213   trimester  ---------------------------------------------------------------------- OB History  Blood Type:            Height:  5'6"   Weight (lb):  262       BMI:  42.28  Gravidity:    3         Term:   2  Living:       2 ---------------------------------------------------------------------- Fetal Evaluation  Num Of Fetuses:     1  Fetal Heart         159  Rate(bpm):  Cardiac Activity:   Observed  Presentation:       Cephalic  Placenta:           Posterior, above cervical os  P.  Cord Insertion:  Previously Visualized  Amniotic Fluid  AFI FV:      Subjectively upper-normal  AFI Sum(cm)     %Tile       Largest Pocket(cm)  21.11           79          9.17  RUQ(cm)       RLQ(cm)       LUQ(cm)        LLQ(cm)  4.27          1.9  9.17           5.77 ---------------------------------------------------------------------- Biophysical Evaluation  Amniotic F.V:   Within normal limits       F. Tone:        Observed  F. Movement:    Observed                   Score:          8/8  F. Breathing:   Observed ---------------------------------------------------------------------- Biometry  BPD:      87.1  mm     G. Age:  35w 1d         64  %    CI:         76.8   %    70 - 86                                                          FL/HC:      20.2   %    20.1 - 22.3  HC:      314.8  mm     G. Age:  35w 2d         30  %    HC/AC:      1.01        0.93 - 1.11  AC:      311.1  mm     G. Age:  35w 0d         65  %    FL/BPD:     72.9   %    71 - 87  FL:       63.5  mm     G. Age:  32w 6d          7  %    FL/AC:      20.4   %    20 - 24  HUM:      54.5  mm     G. Age:  31w 5d        < 5  %  Est. FW:    2442  gm      5 lb 6 oz     56  % ---------------------------------------------------------------------- Gestational Age  U/S Today:     34w 4d                                        EDD:   07/03/17  Best:          34w 5d     Det. ByLoman Chroman         EDD:   07/02/17                                      (11/07/16) ---------------------------------------------------------------------- Anatomy  Cranium:               Appears normal         Aortic Arch:            Previously seen  Cavum:  Appears normal         Ductal Arch:            Previously seen  Ventricles:            Previously seen        Diaphragm:              Previously seen  Choroid Plexus:        Previously seen        Stomach:                Appears normal, left                                                                         sided  Cerebellum:            Previously seen        Abdomen:                Appears normal  Posterior Fossa:       Previously seen        Abdominal Wall:         Previously seen  Nuchal Fold:           Previously seen        Cord Vessels:           Appears normal (3                                                                        vessel cord)  Face:                  Orbits and profile     Kidneys:                Appear normal                         previously seen  Lips:                  Appears normal         Bladder:                Appears normal  Thoracic:              Appears normal         Spine:                  Previously seen  Heart:                 Previously seen        Upper Extremities:      Previously seen  RVOT:                  Appears normal         Lower Extremities:      Previously seen  LVOT:  Previously seen  Other:  Fetus appears to be a female. Heels and 5th digit prev. visualized.          Technically difficult due to maternal habitus, fetal position, and fetal          movement. ---------------------------------------------------------------------- Cervix Uterus Adnexa  Cervix  Not visualized (advanced GA >29wks)  Uterus  No abnormality visualized.  Left Ovary  Not visualized.  Right Ovary  Not visualized.  Cul De Sac:   No free fluid seen.  Adnexa:       No abnormality visualized. ---------------------------------------------------------------------- Impression  Singleton intrauterine pregnancy at 34 weeks 5 days  gestation with fetal cardiac activity  Cephalic presentation  Normal appearing fetal growth  BPP 8/8 with an AFI > 21 cm ---------------------------------------------------------------------- Recommendations  Continue antenatal testing. ----------------------------------------------------------------------                   Seward Meth, MD Electronically Signed Final Report   05/26/2017 11:18 am  ----------------------------------------------------------------------  [place] Mfm Fetal Bpp Wo Non Stress  Result Date: 05/19/2017 ----------------------------------------------------------------------  OBSTETRICS REPORT                      (Signed Final 05/19/2017 02:56 pm) ---------------------------------------------------------------------- Patient Info  ID #:       [id number]                          D.O.B.:  June 27, 1990 (26 yrs)  Name:       [facility]             Visit Date: 05/19/2017 02:16 pm ---------------------------------------------------------------------- Performed By  Performed By:     Rodrigo Ran BS      Ref. Address:     [address]                    [place]                                                             [place], [place]  Attending:        Oralia Rud       Location:         [facility]                    MD  Referred By:      Sherry Halim MD ---------------------------------------------------------------------- Orders   #  Description                                 Code   1  [place] MFM FETAL BPP WO NON STRESS              76819.01  ----------------------------------------------------------------------   #  Ordered By               Order #        Accession #    Episode #   1  Sherry Spence            [id number]      [id number]     [id number]  ---------------------------------------------------------------------- Indications   [redacted] weeks gestation of pregnancy                I6E.70   Obesity complicating pregnancy, third          O99.213   trimester   Polyhydramnios, third trimester, antepartum    O40.3XX0   condition or complication, unspecified fetus  ---------------------------------------------------------------------- OB History  Blood Type:            Height:  5'6"   Weight (lb):  262       BMI:  42.28   Gravidity:    3         Term:   2  Living:       2 ---------------------------------------------------------------------- Fetal Evaluation  Num Of Fetuses:     1  Fetal Heart         149  Rate(bpm):  Cardiac Activity:   Observed  Presentation:       Cephalic  Amniotic Fluid  AFI FV:      Polyhydramnios  AFI Sum(cm)     %Tile       Largest Pocket(cm)  25.54           96          8.85  RUQ(cm)       RLQ(cm)       LUQ(cm)        LLQ(cm)  5.54          4.49          6.66           8.85 ---------------------------------------------------------------------- Biophysical Evaluation  Amniotic F.V:   Within normal limits       F. Tone:        Observed  F. Movement:    Observed                   Score:          8/8  F. Breathing:   Observed ---------------------------------------------------------------------- Gestational Age  Best:          33w 5d     Det. ByLoman Chroman         EDD:   07/02/17                                      (11/07/16) ---------------------------------------------------------------------- Impression  SIUP at [redacted]w[redacted]d  active fetus  BPP 8/8  AFI is consistent with polyhydramnios ---------------------------------------------------------------------- Recommendations  Continue antenatal testing. ----------------------------------------------------------------------               Oralia Rud, MD Electronically Signed Final Report   05/19/2017 02:56 pm ----------------------------------------------------------------------  [place]  Mfm Fetal Bpp Wo Non Stress  Result Date: 05/12/2017 ----------------------------------------------------------------------  OBSTETRICS REPORT                      (Signed Final 05/12/2017 05:43 pm) ---------------------------------------------------------------------- Patient Info  ID #:       [id number]                          D.O.B.:  11-Dec-1990 (26 yrs)  Name:       [facility]             Visit Date: 05/12/2017 03:48 pm  ---------------------------------------------------------------------- Performed By  Performed By:     Sherry Spence,      Ref. Address:     [place]                    [place], [place]  Attending:        Renella Cunas MD       Location:         [facility]  Referred By:      Sherry Halim MD ---------------------------------------------------------------------- Orders   #  Description                                 Code   1  [place] MFM FETAL BPP WO NON STRESS              [phone]  ----------------------------------------------------------------------   #  Ordered By               Order #        Accession #    Episode #   1  Sherry Spence            [id number]      [id number]     [id number]  ---------------------------------------------------------------------- Indications   [redacted] weeks gestation of pregnancy                Q7H.41   Obesity complicating pregnancy, third          O99.213   trimester   Polyhydramnios, third trimester, antepartum    O40.3XX0   condition or complication, unspecified fetus  ---------------------------------------------------------------------- OB History  Blood Type:            Height:  5'6"   Weight (lb):  262       BMI:  42.28  Gravidity:    3         Term:   2  Living:       2 ---------------------------------------------------------------------- Fetal Evaluation  Num Of Fetuses:     1  Fetal Heart         138  Rate(bpm):  Cardiac Activity:   Observed  Presentation:       Cephalic  Amniotic Fluid  AFI FV:      Polyhydramnios  AFI Sum(cm)     %Tile       Largest Pocket(cm)  29.75           > 97        8.74  RUQ(cm)       RLQ(cm)       LUQ(cm)        LLQ(cm)  7.02          8.74          8.27           5.72  ---------------------------------------------------------------------- Biophysical Evaluation  Amniotic F.V:   Pocket => 2 cm two         F. Tone:        Observed                  planes  F. Movement:    Observed                   Score:          8/8  F. Breathing:   Observed ---------------------------------------------------------------------- Gestational Age  Best:          32w 5d     Det. By:  Loman Chroman         EDD:   07/02/17                                      (11/07/16) ---------------------------------------------------------------------- Anatomy  Stomach:               Appears normal, left                         sided ---------------------------------------------------------------------- Impression  SIUP at 32+5 weeks  Mild polyhydramnios  BPP 8/8 ---------------------------------------------------------------------- Recommendations  Continue current management ----------------------------------------------------------------------                 Sherry Cunas, MD Electronically Signed Final Report   05/12/2017 05:43 pm ----------------------------------------------------------------------  [place] Mfm Ob Follow Up  Result Date: 05/26/2017 ----------------------------------------------------------------------  OBSTETRICS REPORT                      (Signed Final 05/26/2017 11:18 am) ---------------------------------------------------------------------- Patient Info  ID #:       [id number]                          D.O.B.:  [birth year] (26 yrs)  Name:       [facility]             Visit Date: 05/26/2017 10:35 am ---------------------------------------------------------------------- Performed By  Performed By:     Raquel James         Ref.  Address:     [address]                                                             [place], [place]   Attending:        Seward Meth MD         Location:         [facility]  Referred By:      Sherry Halim MD ---------------------------------------------------------------------- Orders   #  Description                                 Code   1  [place] MFM OB FOLLOW UP                         [phone]   2  [place] MFM FETAL BPP WO NON STRESS              76819.01  ----------------------------------------------------------------------   #  Ordered By               Order #        Accession #    Episode #   1  Sherry Spence            [id number]      [id number]     [id number]   2  [place]            [id number]      [id number]     [id number]  ---------------------------------------------------------------------- Indications   [redacted] weeks gestation of pregnancy                Z3A.34   Polyhydramnios, third trimester, antepartum    O40.3XX0   condition or complication, unspecified fetus   Obesity complicating pregnancy, third          O99.213   trimester  ---------------------------------------------------------------------- OB History  Blood Type:            Height:  5'6"   Weight (lb):  262       BMI:  42.28  Gravidity:    3         Term:   2  Living:       2 ---------------------------------------------------------------------- Fetal Evaluation  Num Of  Fetuses:     1  Fetal Heart         159  Rate(bpm):  Cardiac Activity:   Observed  Presentation:       Cephalic  Placenta:           Posterior, above cervical os  P. Cord Insertion:  Previously Visualized  Amniotic Fluid  AFI FV:      Subjectively upper-normal  AFI Sum(cm)     %Tile       Largest Pocket(cm)  21.11           79          9.17  RUQ(cm)       RLQ(cm)       LUQ(cm)        LLQ(cm)  4.27          1.9           9.17           5.77 ---------------------------------------------------------------------- Biophysical Evaluation  Amniotic F.V:   Within normal limits       F. Tone:        Observed  F. Movement:    Observed                   Score:           8/8  F. Breathing:   Observed ---------------------------------------------------------------------- Biometry  BPD:      87.1  mm     G. Age:  35w 1d         64  %    CI:         76.8   %    70 - 86                                                          FL/HC:      20.2   %    20.1 - 22.3  HC:      314.8  mm     G. Age:  35w 2d         30  %    HC/AC:      1.01        0.93 - 1.11  AC:      311.1  mm     G. Age:  35w 0d         65  %    FL/BPD:     72.9   %    71 - 87  FL:       63.5  mm     G. Age:  32w 6d          7  %    FL/AC:      20.4   %    20 - 24  HUM:      54.5  mm     G. Age:  31w 5d        < 5  %  Est. FW:    2442  gm      5 lb 6 oz     56  % ---------------------------------------------------------------------- Gestational Age  U/S Today:     34w 4d  EDD:   07/03/17  Best:          34w 5d     Det. ByLoman Chroman         EDD:   07/02/17                                      (11/07/16) ---------------------------------------------------------------------- Anatomy  Cranium:               Appears normal         Aortic Arch:            Previously seen  Cavum:                 Appears normal         Ductal Arch:            Previously seen  Ventricles:            Previously seen        Diaphragm:              Previously seen  Choroid Plexus:        Previously seen        Stomach:                Appears normal, left                                                                        sided  Cerebellum:            Previously seen        Abdomen:                Appears normal  Posterior Fossa:       Previously seen        Abdominal Wall:         Previously seen  Nuchal Fold:           Previously seen        Cord Vessels:           Appears normal (3                                                                        vessel cord)  Face:                  Orbits and profile     Kidneys:                Appear normal                         previously seen  Lips:                   Appears normal         Bladder:  Appears normal  Thoracic:              Appears normal         Spine:                  Previously seen  Heart:                 Previously seen        Upper Extremities:      Previously seen  RVOT:                  Appears normal         Lower Extremities:      Previously seen  LVOT:                  Previously seen  Other:  Fetus appears to be a female. Heels and 5th digit prev. visualized.          Technically difficult due to maternal habitus, fetal position, and fetal          movement. ---------------------------------------------------------------------- Cervix Uterus Adnexa  Cervix  Not visualized (advanced GA >29wks)  Uterus  No abnormality visualized.  Left Ovary  Not visualized.  Right Ovary  Not visualized.  Cul De Sac:   No free fluid seen.  Adnexa:       No abnormality visualized. ---------------------------------------------------------------------- Impression  Singleton intrauterine pregnancy at 34 weeks 5 days  gestation with fetal cardiac activity  Cephalic presentation  Normal appearing fetal growth  BPP 8/8 with an AFI > 21 cm ---------------------------------------------------------------------- Recommendations  Continue antenatal testing. ----------------------------------------------------------------------                   Seward Meth, MD Electronically Signed Final Report   05/26/2017 11:18 am ----------------------------------------------------------------------  [place] Mfm Ua Cord Doppler  Result Date: 06/04/2017 ----------------------------------------------------------------------  OBSTETRICS REPORT                       (Signed Final 06/04/2017 05:51 pm) ---------------------------------------------------------------------- Patient Info  ID #:       [id number]                          D.O.B.:  [birth year] (26 yrs)  Name:       [facility] [place]             Visit Date: 06/02/2017 12:58 pm  ---------------------------------------------------------------------- Performed By  Performed By:     Sherry Spence        Ref. Address:      [address]                    [place]  Attending:        Renella Cunas MD       Location:          [facility]  Referred By:      Sherry Halim MD ---------------------------------------------------------------------- Orders   #  Description                                 Code   1  [place] MFM FETAL BPP WO NON STRESS              76819.01   2  [place] MFM UA CORD DOPPLER                      26834.19  ----------------------------------------------------------------------   #  Ordered By               Order #        Accession #    Episode #   [place]              [id number]      [id number]     [id number]   2  Sherry Spence              [id number]      [id number]     [id number]  ---------------------------------------------------------------------- Indications   [redacted] weeks gestation of pregnancy                Z3A.35   Polyhydramnios, third trimester, antepartum    O40.3XX0   condition or complication, unspecified fetus   Obesity complicating pregnancy, third          O99.213   trimester  ---------------------------------------------------------------------- OB History  Blood Type:            Height:  5'6"   Weight (lb):  262       BMI:  42.28  Gravidity:    3         Term:   2  Living:       2 ---------------------------------------------------------------------- Fetal Evaluation  Num Of Fetuses:     1  Fetal Heart         161  Rate(bpm):  Cardiac Activity:   Observed  Presentation:       Cephalic  Amniotic Fluid  AFI FV:      Subjectively within normal limits  AFI Sum(cm)     %Tile       Largest Pocket(cm)  23.15           88          7.41  RUQ(cm)        RLQ(cm)       LUQ(cm)        LLQ(cm)  4.62          6.71          4.41           7.41 ---------------------------------------------------------------------- Biophysical Evaluation  Amniotic F.V:   Pocket => 2 cm two         F. Tone:         Observed                  planes  F. Movement:    Observed  Score:           8/8  F. Breathing:   Observed ---------------------------------------------------------------------- Gestational Age  Best:          35w 5d     Det. ByLoman Chroman         EDD:    07/02/17                                      (11/07/16) ---------------------------------------------------------------------- Doppler - Fetal Vessels  Umbilical Artery   S/D     %tile     RI              PI               PSV   ADFV    RDFV                                                   (cm/s)  2.16        33   0.54            0.78             52.53      No       No ---------------------------------------------------------------------- Impression  SIUP at 35+5 weeks  High normal amniotic fluid volume  BPP 8/8  UA dopplers were normal for this GA ---------------------------------------------------------------------- Recommendations  If AFV is within normal limits next week, can discontinue  testing ----------------------------------------------------------------------                 Sherry Cunas, MD Electronically Signed Final Report   06/04/2017 05:51 pm ----------------------------------------------------------------------   Assessment and Plan:  Pregnancy: [id number] at 109w5d  1. Polyhydramnios affecting pregnancy in third trimester Will check AFI today. If normal, no longer will need AP testing.  - Fetal nonstress test: Reactive - POCT bedside ultrasound: AFI 20 No longer needs AP testing.  2. Supervision of high risk pregnancy, antepartum Pelvic cultures done today. - Strep Gp B NAA - GC/Chlamydia probe amp ()not at [facility]  Preterm labor symptoms and general obstetric  precautions including but not limited to vaginal bleeding, contractions, leaking of fluid and fetal movement were reviewed in detail with the patient. Please refer to After Visit Summary for other counseling recommendations.  Return in about 1 week (around 06/16/2017) for OB Visit.   Verita Schneiders, MD

## 2017-06-11 LAB — STREP GP B NAA: Strep Gp B NAA: NEGATIVE

## 2017-06-12 LAB — GC/CHLAMYDIA PROBE AMP (~~LOC~~) NOT AT ARMC
Chlamydia: NEGATIVE
Neisseria Gonorrhea: NEGATIVE

## 2017-06-13 ENCOUNTER — Encounter: Payer: Medicaid Other | Admitting: Obstetrics & Gynecology

## 2017-06-16 ENCOUNTER — Other Ambulatory Visit (HOSPITAL_COMMUNITY): Payer: Medicaid Other

## 2017-06-17 ENCOUNTER — Inpatient Hospital Stay (HOSPITAL_COMMUNITY)
Admission: AD | Admit: 2017-06-17 | Discharge: 2017-06-17 | Disposition: A | Discharge status: Home / Self Care | Payer: Medicaid Other | Source: Ambulatory Visit | Attending: Obstetrics & Gynecology | Admitting: Obstetrics & Gynecology

## 2017-06-17 ENCOUNTER — Other Ambulatory Visit: Payer: Self-pay

## 2017-06-17 ENCOUNTER — Encounter (HOSPITAL_COMMUNITY): Payer: Self-pay | Admitting: *Deleted

## 2017-06-17 DIAGNOSIS — O0990 Supervision of high risk pregnancy, unspecified, unspecified trimester: Secondary | ICD-10-CM

## 2017-06-17 DIAGNOSIS — Z3A38 38 weeks gestation of pregnancy: Secondary | ICD-10-CM | POA: Diagnosis not present

## 2017-06-17 DIAGNOSIS — O479 False labor, unspecified: Secondary | ICD-10-CM

## 2017-06-17 DIAGNOSIS — O471 False labor at or after 37 completed weeks of gestation: Secondary | ICD-10-CM | POA: Insufficient documentation

## 2017-06-17 NOTE — Discharge Instructions (Signed)
Braxton Hicks Contractions °Contractions of the uterus can occur throughout pregnancy, but they are not always a sign that you are in labor. You may have practice contractions called Braxton Hicks contractions. These false labor contractions are sometimes confused with true labor. °What are Braxton Hicks contractions? °Braxton Hicks contractions are tightening movements that occur in the muscles of the uterus before labor. Unlike true labor contractions, these contractions do not result in opening (dilation) and thinning of the cervix. Toward the end of pregnancy (32-34 weeks), Braxton Hicks contractions can happen more often and may become stronger. These contractions are sometimes difficult to tell apart from true labor because they can be very uncomfortable. You should not feel embarrassed if you go to the hospital with false labor. °Sometimes, the only way to tell if you are in true labor is for your health care provider to look for changes in the cervix. The health care provider will do a physical exam and may monitor your contractions. If you are not in true labor, the exam should show that your cervix is not dilating and your water has not broken. °If there are other health problems associated with your pregnancy, it is completely safe for you to be sent home with false labor. You may continue to have Braxton Hicks contractions until you go into true labor. °How to tell the difference between true labor and false labor °True labor °· Contractions last 30-70 seconds. °· Contractions become very regular. °· Discomfort is usually felt in the top of the uterus, and it spreads to the lower abdomen and low back. °· Contractions do not go away with walking. °· Contractions usually become more intense and increase in frequency. °· The cervix dilates and gets thinner. °False labor °· Contractions are usually shorter and not as strong as true labor contractions. °· Contractions are usually irregular. °· Contractions  are often felt in the front of the lower abdomen and in the groin. °· Contractions may go away when you walk around or change positions while lying down. °· Contractions get weaker and are shorter-lasting as time goes on. °· The cervix usually does not dilate or become thin. °Follow these instructions at home: °· Take over-the-counter and prescription medicines only as told by your health care provider. °· Keep up with your usual exercises and follow other instructions from your health care provider. °· Eat and drink lightly if you think you are going into labor. °· If Braxton Hicks contractions are making you uncomfortable: °? Change your position from lying down or resting to walking, or change from walking to resting. °? Sit and rest in a tub of warm water. °? Drink enough fluid to keep your urine pale yellow. Dehydration may cause these contractions. °? Do slow and deep breathing several times an hour. °· Keep all follow-up prenatal visits as told by your health care provider. This is important. °Contact a health care provider if: °· You have a fever. °· You have continuous pain in your abdomen. °Get help right away if: °· Your contractions become stronger, more regular, and closer together. °· You have fluid leaking or gushing from your vagina. °· You pass blood-tinged mucus (bloody show). °· You have bleeding from your vagina. °· You have low back pain that you never had before. °· You feel your baby’s head pushing down and causing pelvic pressure. °· Your baby is not moving inside you as much as it used to. °Summary °· Contractions that occur before labor are called Braxton   Hicks contractions, false labor, or practice contractions. °· Braxton Hicks contractions are usually shorter, weaker, farther apart, and less regular than true labor contractions. True labor contractions usually become progressively stronger and regular and they become more frequent. °· Manage discomfort from Braxton Hicks contractions by  changing position, resting in a warm bath, drinking plenty of water, or practicing deep breathing. °This information is not intended to replace advice given to you by your health care provider. Make sure you discuss any questions you have with your health care provider. °Document Released: 09/22/2016 Document Revised: 09/22/2016 Document Reviewed: 09/22/2016 °Elsevier Interactive Patient Education © 2018 Elsevier Inc. ° °

## 2017-06-17 NOTE — MAU Note (Signed)
Pt reports uc's for one hour. Denies LOF or bleeding. + Fm .

## 2017-06-17 NOTE — MAU Note (Signed)
I have communicated with Dr. Annie Main and reviewed vital signs:  Vitals:   06/17/17 2140 06/17/17 2223  BP: 114/68 123/71  Pulse: (!) 101 88  Resp: 18   Temp: 98.3 F (36.8 C)     Vaginal exam:  Dilation: 3 Effacement (%): Thick Cervical Position: Anterior Station: Ballotable Exam by:: B Marchia Diguglielmo RN,   Also reviewed contraction pattern and that non-stress test is reactive.  It has been documented that patient is contracting rarely with not indicating active labor.  Pt states that she wants to go home. Patient denies any other complaints.  Based on this report provider has given order for discharge.  A discharge order and diagnosis entered by a provider.   Labor discharge instructions reviewed with patient.

## 2017-06-20 ENCOUNTER — Telehealth (HOSPITAL_COMMUNITY): Payer: Self-pay | Admitting: *Deleted

## 2017-06-20 ENCOUNTER — Ambulatory Visit (INDEPENDENT_AMBULATORY_CARE_PROVIDER_SITE_OTHER): Payer: Medicaid Other | Admitting: Obstetrics & Gynecology

## 2017-06-20 ENCOUNTER — Encounter (HOSPITAL_COMMUNITY): Payer: Self-pay | Admitting: *Deleted

## 2017-06-20 VITALS — BP 133/83 | HR 98 | Wt 267.0 lb

## 2017-06-20 DIAGNOSIS — O403XX0 Polyhydramnios, third trimester, not applicable or unspecified: Secondary | ICD-10-CM

## 2017-06-20 DIAGNOSIS — O0993 Supervision of high risk pregnancy, unspecified, third trimester: Secondary | ICD-10-CM

## 2017-06-20 DIAGNOSIS — O0990 Supervision of high risk pregnancy, unspecified, unspecified trimester: Secondary | ICD-10-CM

## 2017-06-20 NOTE — Patient Instructions (Signed)
Return to clinic for any scheduled appointments or obstetric concerns, or go to MAU for evaluation   Labor Induction Labor induction is when steps are taken to cause a pregnant woman to begin the labor process. Most women go into labor on their own between 37 weeks and 42 weeks of the pregnancy. When this does not happen or when there is a medical need, methods may be used to induce labor. Labor induction causes a pregnant woman's uterus to contract. It also causes the cervix to soften (ripen), open (dilate), and thin out (efface). Usually, labor is not induced before 39 weeks of the pregnancy unless there is a problem with the baby or mother. Before inducing labor, your health care provider will consider a number of factors, including the following:  The medical condition of you and the baby.  How many weeks along you are.  The status of the baby's lung maturity.  The condition of the cervix.  The position of the baby.  What are the reasons for labor induction? Labor may be induced for the following reasons:  The health of the baby or mother is at risk.  The pregnancy is overdue by 1 week or more.  The water breaks but labor does not start on its own.  The mother has a health condition or serious illness, such as high blood pressure, infection, placental abruption, or diabetes.  The amniotic fluid amounts are low around the baby.  The baby is distressed.  Convenience or wanting the baby to be born on a certain date is not a reason for inducing labor. What methods are used for labor induction? Several methods of labor induction may be used, such as:  Prostaglandin medicine. This medicine causes the cervix to dilate and ripen. The medicine will also start contractions. It can be taken by mouth or by inserting a suppository into the vagina.  Inserting a thin tube (catheter) with a balloon on the end into the vagina to dilate the cervix. Once inserted, the balloon is expanded  with water, which causes the cervix to open.  Stripping the membranes. Your health care provider separates amniotic sac tissue from the cervix, causing the cervix to be stretched and causing the release of a hormone called progesterone. This may cause the uterus to contract. It is often done during an office visit. You will be sent home to wait for the contractions to begin. You will then come in for an induction.  Breaking the water. Your health care provider makes a hole in the amniotic sac using a small instrument. Once the amniotic sac breaks, contractions should begin. This may still take hours to see an effect.  Medicine to trigger or strengthen contractions. This medicine is given through an IV access tube inserted into a vein in your arm.  All of the methods of induction, besides stripping the membranes, will be done in the hospital. Induction is done in the hospital so that you and the baby can be carefully monitored. How long does it take for labor to be induced? Some inductions can take up to 2-3 days. Depending on the cervix, it usually takes less time. It takes longer when you are induced early in the pregnancy or if this is your first pregnancy. If a mother is still pregnant and the induction has been going on for 2-3 days, either the mother will be sent home or a cesarean delivery will be needed. What are the risks associated with labor induction? Some of the risks   of induction include:  Changes in fetal heart rate, such as too high, too low, or erratic.  Fetal distress.  Chance of infection for the mother and baby.  Increased chance of having a cesarean delivery.  Breaking off (abruption) of the placenta from the uterus (rare).  Uterine rupture (very rare).  When induction is needed for medical reasons, the benefits of induction may outweigh the risks. What are some reasons for not inducing labor? Labor induction should not be done if:  It is shown that your baby does  not tolerate labor.  You have had previous surgeries on your uterus, such as a myomectomy or the removal of fibroids.  Your placenta lies very low in the uterus and blocks the opening of the cervix (placenta previa).  Your baby is not in a head-down position.  The umbilical cord drops down into the birth canal in front of the baby. This could cut off the baby's blood and oxygen supply.  You have had a previous cesarean delivery.  There are unusual circumstances, such as the baby being extremely premature.  This information is not intended to replace advice given to you by your health care provider. Make sure you discuss any questions you have with your health care provider. Document Released: 09/28/2006 Document Revised: 10/15/2015 Document Reviewed: 12/06/2012 Elsevier Interactive Patient Education  2017 Elsevier Inc.  

## 2017-06-20 NOTE — Telephone Encounter (Signed)
Preadmission screen  

## 2017-06-20 NOTE — Progress Notes (Signed)
   PRENATAL VISIT NOTE  Subjective:  Sherry Spence is a 27 y.o. G3P2002 at 110w2d being seen today for ongoing prenatal care.  She is currently monitored for the following issues for this high-risk pregnancy and has BMI 40.0-44.9, adult ([place]); Obesity in pregnancy; Supervision of high risk pregnancy, antepartum; Short interval between pregnancies affecting pregnancy in first trimester, antepartum; and Polyhydramnios affecting pregnancy in third trimester on their problem list.  Patient reports occasional contractions.  Contractions: Irregular. Vag. Bleeding: None.  Movement: Present. Denies leaking of fluid.   The following portions of the patient's history were reviewed and updated as appropriate: allergies, current medications, past family history, past medical history, past social history, past surgical history and problem list. Problem list updated.  Objective:   Vitals:   06/20/17 1323  BP: 133/83  Pulse: 98  Weight: 267 lb (121.1 kg)    Fetal Status: Fetal Heart Rate (bpm): 140 Fundal Height: 39 cm Movement: Present  Presentation: Vertex  General:  Alert, oriented and cooperative. Patient is in no acute distress.  Skin: Skin is warm and dry. No rash noted.   Cardiovascular: Normal heart rate noted  Respiratory: Normal respiratory effort, no problems with respiration noted  Abdomen: Soft, gravid, appropriate for gestational age.  Pain/Pressure: Present     Pelvic: Cervical exam performed Dilation: 3 Effacement (%): Thick Station: Ballotable  Extremities: Normal range of motion.  Edema: Trace  Mental Status:  Normal mood and affect. Normal behavior. Normal judgment and thought content.   Assessment and Plan:  Pregnancy: G3P2002 at [redacted]w[redacted]d  1. Polyhydramnios affecting pregnancy in third trimester IOL already scheduled at 39 weeks; patient desires to keep this induction despite resolved polyhydramnios noted last week.  She has a favorable cervix.   2. Supervision of high risk  pregnancy, antepartum Term labor symptoms and general obstetric precautions including but not limited to vaginal bleeding, contractions, leaking of fluid and fetal movement were reviewed in detail with the patient. Please refer to After Visit Summary for other counseling recommendations.  Return in about 5 weeks (around 07/25/2017) for Postpartum check.   Verita Schneiders, MD

## 2017-06-22 ENCOUNTER — Other Ambulatory Visit: Payer: Self-pay | Admitting: Family Medicine

## 2017-06-22 ENCOUNTER — Inpatient Hospital Stay (HOSPITAL_COMMUNITY): Payer: Medicaid Other | Admitting: Anesthesiology

## 2017-06-22 ENCOUNTER — Encounter (HOSPITAL_COMMUNITY): Payer: Self-pay

## 2017-06-22 ENCOUNTER — Inpatient Hospital Stay (HOSPITAL_COMMUNITY)
Admission: AD | Admit: 2017-06-22 | Discharge: 2017-06-24 | DRG: 807 | Disposition: A | Discharge status: Home / Self Care | Payer: Medicaid Other | Source: Ambulatory Visit | Attending: Obstetrics & Gynecology | Admitting: Obstetrics & Gynecology

## 2017-06-22 DIAGNOSIS — O99210 Obesity complicating pregnancy, unspecified trimester: Secondary | ICD-10-CM | POA: Diagnosis present

## 2017-06-22 DIAGNOSIS — Z3483 Encounter for supervision of other normal pregnancy, third trimester: Secondary | ICD-10-CM | POA: Diagnosis present

## 2017-06-22 DIAGNOSIS — Z3A38 38 weeks gestation of pregnancy: Secondary | ICD-10-CM

## 2017-06-22 DIAGNOSIS — O09891 Supervision of other high risk pregnancies, first trimester: Secondary | ICD-10-CM

## 2017-06-22 DIAGNOSIS — O0990 Supervision of high risk pregnancy, unspecified, unspecified trimester: Secondary | ICD-10-CM

## 2017-06-22 DIAGNOSIS — E669 Obesity, unspecified: Secondary | ICD-10-CM | POA: Diagnosis present

## 2017-06-22 DIAGNOSIS — O99214 Obesity complicating childbirth: Principal | ICD-10-CM | POA: Diagnosis present

## 2017-06-22 DIAGNOSIS — O403XX0 Polyhydramnios, third trimester, not applicable or unspecified: Secondary | ICD-10-CM | POA: Diagnosis present

## 2017-06-22 LAB — TYPE AND SCREEN
ABO/RH(D): O POS
Antibody Screen: NEGATIVE

## 2017-06-22 LAB — CBC
HCT: 32.5 % — ABNORMAL LOW (ref 36.0–46.0)
Hemoglobin: 10.4 g/dL — ABNORMAL LOW (ref 12.0–15.0)
MCH: 24.2 pg — ABNORMAL LOW (ref 26.0–34.0)
MCHC: 32 g/dL (ref 30.0–36.0)
MCV: 75.6 fL — ABNORMAL LOW (ref 78.0–100.0)
Platelets: 393 10*3/uL (ref 150–400)
RBC: 4.3 MIL/uL (ref 3.87–5.11)
RDW: 16 % — ABNORMAL HIGH (ref 11.5–15.5)
WBC: 8.5 10*3/uL (ref 4.0–10.5)

## 2017-06-22 MED ORDER — OXYTOCIN 40 UNITS IN LACTATED RINGERS INFUSION - SIMPLE MED: 2.5000 [IU]/h | INTRAVENOUS | Status: DC

## 2017-06-22 MED ORDER — OXYTOCIN 10 UNIT/ML IJ SOLN
INTRAMUSCULAR | Status: AC
  Filled 2017-06-22: qty 1

## 2017-06-22 MED ORDER — LIDOCAINE HCL (PF) 1 % IJ SOLN
30.0000 mL | INTRAMUSCULAR | Status: DC | PRN
  Filled 2017-06-22: qty 30

## 2017-06-22 MED ORDER — PHENYLEPHRINE 40 MCG/ML (10ML) SYRINGE FOR IV PUSH (FOR BLOOD PRESSURE SUPPORT)
80.0000 ug | PREFILLED_SYRINGE | INTRAVENOUS | Status: DC | PRN
  Filled 2017-06-22: qty 5

## 2017-06-22 MED ORDER — LACTATED RINGERS IV SOLN: 500.0000 mL | INTRAVENOUS | Status: DC | PRN

## 2017-06-22 MED ORDER — PRENATAL MULTIVITAMIN CH
1.0000 | ORAL_TABLET | Freq: Every day | ORAL | Status: DC
  Administered 2017-06-22 – 2017-06-24 (×3): 1 via ORAL
  Filled 2017-06-22 (×3): qty 1

## 2017-06-22 MED ORDER — ONDANSETRON HCL 4 MG/2ML IJ SOLN: 4.0000 mg | Freq: Four times a day (QID) | INTRAMUSCULAR | Status: DC | PRN

## 2017-06-22 MED ORDER — DIPHENHYDRAMINE HCL 50 MG/ML IJ SOLN: 12.5000 mg | INTRAMUSCULAR | Status: DC | PRN

## 2017-06-22 MED ORDER — FENTANYL CITRATE (PF) 100 MCG/2ML IJ SOLN
100.0000 ug | INTRAMUSCULAR | Status: DC | PRN
  Administered 2017-06-22: 100 ug via INTRAVENOUS
  Filled 2017-06-22: qty 2

## 2017-06-22 MED ORDER — PRENATAL MULTIVITAMIN CH: 1.0000 | ORAL_TABLET | Freq: Every day | ORAL | Status: DC

## 2017-06-22 MED ORDER — TETANUS-DIPHTH-ACELL PERTUSSIS 5-2.5-18.5 LF-MCG/0.5 IM SUSP: 0.5000 mL | Freq: Once | INTRAMUSCULAR | Status: DC

## 2017-06-22 MED ORDER — ACETAMINOPHEN 325 MG PO TABS
650.0000 mg | ORAL_TABLET | ORAL | Status: DC | PRN
  Administered 2017-06-22 – 2017-06-24 (×5): 650 mg via ORAL
  Filled 2017-06-22 (×5): qty 2

## 2017-06-22 MED ORDER — OXYCODONE-ACETAMINOPHEN 5-325 MG PO TABS: 1.0000 | ORAL_TABLET | ORAL | Status: DC | PRN

## 2017-06-22 MED ORDER — EPHEDRINE 5 MG/ML INJ
10.0000 mg | INTRAVENOUS | Status: DC | PRN
  Filled 2017-06-22: qty 2

## 2017-06-22 MED ORDER — TERBUTALINE SULFATE 1 MG/ML IJ SOLN
0.2500 mg | Freq: Once | INTRAMUSCULAR | Status: DC | PRN
  Filled 2017-06-22: qty 1

## 2017-06-22 MED ORDER — ONDANSETRON HCL 4 MG PO TABS: 4.0000 mg | ORAL_TABLET | ORAL | Status: DC | PRN

## 2017-06-22 MED ORDER — HYDROXYZINE HCL 50 MG PO TABS
50.0000 mg | ORAL_TABLET | Freq: Four times a day (QID) | ORAL | Status: DC | PRN
  Filled 2017-06-22: qty 1

## 2017-06-22 MED ORDER — WITCH HAZEL-GLYCERIN EX PADS: 1.0000 "application " | MEDICATED_PAD | CUTANEOUS | Status: DC | PRN

## 2017-06-22 MED ORDER — DIBUCAINE 1 % RE OINT: 1.0000 "application " | TOPICAL_OINTMENT | RECTAL | Status: DC | PRN

## 2017-06-22 MED ORDER — PHENYLEPHRINE 40 MCG/ML (10ML) SYRINGE FOR IV PUSH (FOR BLOOD PRESSURE SUPPORT)
80.0000 ug | PREFILLED_SYRINGE | INTRAVENOUS | Status: DC | PRN
  Filled 2017-06-22: qty 5
  Filled 2017-06-22: qty 10

## 2017-06-22 MED ORDER — ONDANSETRON HCL 4 MG/2ML IJ SOLN: 4.0000 mg | INTRAMUSCULAR | Status: DC | PRN

## 2017-06-22 MED ORDER — COCONUT OIL OIL: 1.0000 "application " | TOPICAL_OIL | Status: DC | PRN

## 2017-06-22 MED ORDER — BENZOCAINE-MENTHOL 20-0.5 % EX AERO: 1.0000 "application " | INHALATION_SPRAY | CUTANEOUS | Status: DC | PRN

## 2017-06-22 MED ORDER — SIMETHICONE 80 MG PO CHEW: 80.0000 mg | CHEWABLE_TABLET | ORAL | Status: DC | PRN

## 2017-06-22 MED ORDER — ONDANSETRON HCL 4 MG/2ML IJ SOLN
4.0000 mg | INTRAMUSCULAR | Status: DC | PRN
Start: 2017-06-22 — End: 2017-06-22

## 2017-06-22 MED ORDER — ACETAMINOPHEN 325 MG PO TABS: 650.0000 mg | ORAL_TABLET | ORAL | Status: DC | PRN

## 2017-06-22 MED ORDER — OXYCODONE-ACETAMINOPHEN 5-325 MG PO TABS: 2.0000 | ORAL_TABLET | ORAL | Status: DC | PRN

## 2017-06-22 MED ORDER — OXYTOCIN 40 UNITS IN LACTATED RINGERS INFUSION - SIMPLE MED
INTRAVENOUS | Status: AC
  Filled 2017-06-22: qty 1000

## 2017-06-22 MED ORDER — FLEET ENEMA 7-19 GM/118ML RE ENEM: 1.0000 | ENEMA | Freq: Every day | RECTAL | Status: DC | PRN

## 2017-06-22 MED ORDER — LACTATED RINGERS IV SOLN: 500.0000 mL | Freq: Once | INTRAVENOUS | Status: DC

## 2017-06-22 MED ORDER — IBUPROFEN 600 MG PO TABS
600.0000 mg | ORAL_TABLET | Freq: Four times a day (QID) | ORAL | Status: DC
  Administered 2017-06-22 – 2017-06-24 (×9): 600 mg via ORAL
  Filled 2017-06-22 (×9): qty 1

## 2017-06-22 MED ORDER — DIPHENHYDRAMINE HCL 25 MG PO CAPS: 25.0000 mg | ORAL_CAPSULE | Freq: Four times a day (QID) | ORAL | Status: DC | PRN

## 2017-06-22 MED ORDER — LIDOCAINE HCL (PF) 1 % IJ SOLN
INTRAMUSCULAR | Status: DC | PRN
  Administered 2017-06-22 (×2): 5 mL via EPIDURAL

## 2017-06-22 MED ORDER — FENTANYL 2.5 MCG/ML BUPIVACAINE 1/10 % EPIDURAL INFUSION (WH - ANES)
14.0000 mL/h | INTRAMUSCULAR | Status: DC | PRN
  Administered 2017-06-22: 14 mL/h via EPIDURAL
  Filled 2017-06-22: qty 100

## 2017-06-22 MED ORDER — LACTATED RINGERS IV SOLN: INTRAVENOUS | Status: DC

## 2017-06-22 MED ORDER — OXYCODONE-ACETAMINOPHEN 5-325 MG PO TABS
1.0000 | ORAL_TABLET | Freq: Once | ORAL | Status: AC
  Administered 2017-06-22: 1 via ORAL
  Filled 2017-06-22: qty 1

## 2017-06-22 MED ORDER — ONDANSETRON HCL 4 MG PO TABS
4.0000 mg | ORAL_TABLET | ORAL | Status: DC | PRN
Start: 2017-06-22 — End: 2017-06-24

## 2017-06-22 MED ORDER — LIDOCAINE HCL (PF) 1 % IJ SOLN
INTRAMUSCULAR | Status: AC
  Filled 2017-06-22: qty 30

## 2017-06-22 MED ORDER — FENTANYL CITRATE (PF) 100 MCG/2ML IJ SOLN: 50.0000 ug | INTRAMUSCULAR | Status: DC | PRN

## 2017-06-22 MED ORDER — OXYTOCIN 40 UNITS IN LACTATED RINGERS INFUSION - SIMPLE MED: 1.0000 m[IU]/min | INTRAVENOUS | Status: DC

## 2017-06-22 MED ORDER — IBUPROFEN 600 MG PO TABS
600.0000 mg | ORAL_TABLET | Freq: Four times a day (QID) | ORAL | Status: DC
  Administered 2017-06-22: 600 mg via ORAL
  Filled 2017-06-22: qty 1

## 2017-06-22 MED ORDER — SENNOSIDES-DOCUSATE SODIUM 8.6-50 MG PO TABS
2.0000 | ORAL_TABLET | ORAL | Status: DC
  Administered 2017-06-23 – 2017-06-24 (×2): 2 via ORAL
  Filled 2017-06-22 (×2): qty 2

## 2017-06-22 MED ORDER — OXYTOCIN BOLUS FROM INFUSION
500.0000 mL | Freq: Once | INTRAVENOUS | Status: AC
  Administered 2017-06-22: 500 mL via INTRAVENOUS

## 2017-06-22 MED ORDER — SENNOSIDES-DOCUSATE SODIUM 8.6-50 MG PO TABS: 2.0000 | ORAL_TABLET | ORAL | Status: DC

## 2017-06-22 MED ORDER — ZOLPIDEM TARTRATE 5 MG PO TABS: 5.0000 mg | ORAL_TABLET | Freq: Every evening | ORAL | Status: DC | PRN

## 2017-06-22 MED ORDER — SOD CITRATE-CITRIC ACID 500-334 MG/5ML PO SOLN: 30.0000 mL | ORAL | Status: DC | PRN

## 2017-06-22 NOTE — MAU Note (Signed)
Pt reports contractions every 5 mins since 2:50 am. Pt denies LOF or vaginal bleeding. Reports good fetal movement. Cervix was 3cm on last exam.

## 2017-06-22 NOTE — Progress Notes (Signed)
Notified Dr. Annie Main of pt's request for pain medication. Per pt, abdominal cramping not being relieved by motrin, tylenol, and heat. Dr. Annie Main to evaluate pt's chart.

## 2017-06-22 NOTE — Anesthesia Postprocedure Evaluation (Signed)
Anesthesia Post Note  Patient: Sherry Spence  Procedure(s) Performed: AN AD HOC LABOR EPIDURAL     Patient location during evaluation: Mother Baby Anesthesia Type: Epidural Level of consciousness: awake, awake and alert and oriented Pain management: pain level controlled Vital Signs Assessment: post-procedure vital signs reviewed and stable Respiratory status: spontaneous breathing, nonlabored ventilation and respiratory function stable Cardiovascular status: stable Postop Assessment: no headache, no backache, no apparent nausea or vomiting, adequate PO intake and patient able to bend at knees Anesthetic complications: no    Last Vitals:  Vitals:   06/22/17 1429 06/22/17 1753  BP: 120/61 123/78  Pulse: (!) 59 72  Resp: 18 18  Temp: 36.6 C 36.6 C  SpO2:  100%    Last Pain:  Vitals:   06/22/17 1753  TempSrc: Oral  PainSc:    Pain Goal: Patients Stated Pain Goal: 2 (06/22/17 1714)               Aysha Livecchi

## 2017-06-22 NOTE — Anesthesia Procedure Notes (Signed)
Epidural Patient location during procedure: OB Start time: 06/22/2017 6:30 AM End time: 06/22/2017 6:37 AM  Staffing Anesthesiologist: Janeece Riggers, MD  Preanesthetic Checklist Completed: patient identified, site marked, surgical consent, pre-op evaluation, timeout performed, IV checked, risks and benefits discussed and monitors and equipment checked  Epidural Patient position: sitting Prep: site prepped and draped and DuraPrep Patient monitoring: continuous pulse ox and blood pressure Approach: midline Location: L4-L5 Injection technique: LOR air  Needle:  Needle type: Tuohy  Needle gauge: 17 G Needle length: 9 cm and 9 Needle insertion depth: 7 cm Catheter type: closed end flexible Catheter size: 19 Gauge Catheter at skin depth: 12 cm Test dose: negative  Assessment Events: blood not aspirated, injection not painful, no injection resistance, negative IV test and no paresthesia

## 2017-06-22 NOTE — H&P (Signed)
LABOR AND DELIVERY ADMISSION HISTORY AND PHYSICAL NOTE  Sherry Spence is a 27 y.o. female G43P2002 with IUP at [redacted]w[redacted]d by 6-wk U/S presenting for SOL. She reports positive fetal movement. She denies leakage of fluid or vaginal bleeding.  Prenatal History/Complications: PNC at [facility] Pregnancy complications:  - Polyhydramnios, resolved - Short interval pregnancy  Past Medical History: Past Medical History:  Diagnosis Date  . Cholelithiasis   . Headache   . History of chlamydia infection     Past Surgical History: Past Surgical History:  Procedure Laterality Date  . NO PAST SURGERIES      Obstetrical History: OB History    Gravida Para Term Preterm AB Living   3 2 2  0 0 2   SAB TAB Ectopic Multiple Live Births   0 0 0 0 2      Social History: Social History   Socioeconomic History  . Marital status: Single    Spouse name: None  . Number of children: None  . Years of education: None  . Highest education level: None  Social Needs  . Financial resource strain: None  . Food insecurity - worry: None  . Food insecurity - inability: None  . Transportation needs - medical: None  . Transportation needs - non-medical: None  Occupational History  . None  Tobacco Use  . Smoking status: Never Smoker  . Smokeless tobacco: Never Used  Substance and Sexual Activity  . Alcohol use: No  . Drug use: No  . Sexual activity: Yes    Birth control/protection: None  Other Topics Concern  . None  Social History Narrative  . None    Family History: Family History  Problem Relation Age of Onset  . Hypertension Father   . Diabetes Paternal Aunt   . Cancer Maternal Grandmother   . Diabetes Maternal Grandmother   . Dementia Maternal Grandmother   . Diabetes Paternal Grandmother   . Hypertension Paternal Grandmother   . Mental retardation Neg Hx     Allergies: Allergies  Allergen Reactions  . Compazine [Prochlorperazine Edisylate] Anxiety    Medications Prior to  Admission  Medication Sig Dispense Refill Last Dose  . acetaminophen (TYLENOL) 500 MG tablet Take 1,000 mg by mouth every 6 (six) hours as needed for mild pain, moderate pain or headache.    Taking  . Doxylamine-Pyridoxine ER (BONJESTA) 20-20 MG TBCR Take 1 tablet by mouth at bedtime. May take additional tab midday if needed 60 tablet 3 Taking  . Elastic Bandages & Supports (ABDOMINAL BINDER 10") MISC 1 Units by Does not apply route as needed. Maternity belt 1 each 0 Taking  . Prenatal Vit w/Fe-Methylfol-FA (PNV PO) Take by mouth.   Taking  . RaNITidine HCl (ZANTAC PO) Take by mouth.   Taking     Review of Systems  All systems reviewed and negative except as stated in HPI  Physical Exam Blood pressure 118/75, pulse 89, temperature (!) 97.5 F (36.4 C), temperature source Oral, resp. rate 20, height 5\' 6"  (1.676 m), weight 266 lb (120.7 kg), SpO2 100 %, not currently breastfeeding. General appearance: uncomfortable, screaming with contractions Lungs: normal respiratory effort Heart: regular rate Abdomen: soft, non-tender; gravid, FH appropriate for GA Extremities: No calf swelling or tenderness Presentation: cephalic Fetal monitoring: baseline rate 140, moderate variably, 10x10 acel, no decel Uterine activity: ctx q3-5 min Dilation: 8 Effacement (%): 100 Station: -1, 0 Exam by:: Maryagnes Amos RN  Prenatal labs: ABO, Rh: O/Positive/-- (07/09 1524) Antibody: Negative (  07/09 1524) Rubella: 16.40 (07/09 1524) RPR: Non Reactive (11/21 0910)  HBsAg: Negative (07/09 1524)  HIV: Non Reactive (11/21 0910)  GC/Chlamydia: negative GBS: Negative (01/18 0900)  2-hr GTT: normal Genetic screening:  Negative quad screen Anatomy US: normal anatomy; polyhydramnios, which resolved  Prenatal Transfer Tool  Maternal Diabetes: No Genetic Screening: Normal Maternal Ultrasounds/Referrals: Normal Fetal Ultrasounds or other Referrals:  None Maternal Substance Abuse:  No Significant Maternal  Medications:  None Significant Maternal Lab Results: None  No results found for this or any previous visit (from the past 24 hour(s)).  Patient Active Problem List   Diagnosis Date Noted  . Normal labor 06/22/2017  . Polyhydramnios affecting pregnancy in third trimester 04/27/2017  . Short interval between pregnancies affecting pregnancy in first trimester, antepartum 11/28/2016  . Supervision of high risk pregnancy, antepartum 11/07/2016  . BMI 40.0-44.9, adult ([place]) 03/18/2016  . Obesity in pregnancy 03/18/2016    Assessment: Sherry Spence is a 27 y.o. G3P2002 at [redacted]w[redacted]d here for SOL  #Labor: expectant management #Pain: IV pain meds #FWB: Cat I #ID:  GBS neg #MOF: breast #MOC: Nexplanon #Circ:  N/a, girl  Jenne Pane [name] 06/22/2017, 5:55 AM

## 2017-06-22 NOTE — Anesthesia Preprocedure Evaluation (Signed)
Anesthesia Evaluation  Patient identified by MRN, date of birth, ID band Patient awake    Reviewed: Allergy & Precautions, H&P , NPO status , Patient's Chart, lab work & pertinent test results, reviewed documented beta blocker date and time   Airway Mallampati: II  TM Distance: >3 FB Neck ROM: full    Dental no notable dental hx.    Pulmonary neg pulmonary ROS,    Pulmonary exam normal breath sounds clear to auscultation       Cardiovascular negative cardio ROS Normal cardiovascular exam Rhythm:regular Rate:Normal     Neuro/Psych negative neurological ROS  negative psych ROS   GI/Hepatic negative GI ROS, Neg liver ROS,   Endo/Other  negative endocrine ROS  Renal/GU negative Renal ROS  negative genitourinary   Musculoskeletal   Abdominal   Peds  Hematology negative hematology ROS (+)   Anesthesia Other Findings   Reproductive/Obstetrics (+) Pregnancy                             Anesthesia Physical Anesthesia Plan  ASA: III  Anesthesia Plan: Epidural   Post-op Pain Management:    Induction:   PONV Risk Score and Plan:   Airway Management Planned:   Additional Equipment:   Intra-op Plan:   Post-operative Plan:   Informed Consent: I have reviewed the patients History and Physical, chart, labs and discussed the procedure including the risks, benefits and alternatives for the proposed anesthesia with the patient or authorized representative who has indicated his/her understanding and acceptance.     Plan Discussed with:   Anesthesia Plan Comments:         Anesthesia Quick Evaluation

## 2017-06-23 ENCOUNTER — Other Ambulatory Visit (HOSPITAL_COMMUNITY): Payer: Medicaid Other

## 2017-06-23 ENCOUNTER — Other Ambulatory Visit: Payer: Self-pay

## 2017-06-23 LAB — RPR: RPR Ser Ql: NONREACTIVE

## 2017-06-23 MED ORDER — OXYCODONE HCL 5 MG PO TABS
5.0000 mg | ORAL_TABLET | ORAL | Status: DC | PRN
  Administered 2017-06-23 – 2017-06-24 (×2): 5 mg via ORAL
  Filled 2017-06-23 (×2): qty 1

## 2017-06-23 NOTE — Progress Notes (Signed)
POSTPARTUM PROGRESS NOTE  Post Partum Day 1  Subjective:  Sherry Spence is a 27 y.o. K5L9767 s/p SVD at [redacted]w[redacted]d.  No acute events overnight.  Pt denies problems with ambulating, voiding or po intake.  She denies nausea or vomiting.  Pain is moderately controlled.  She has had flatus. She has not had bowel movement.  Lochia Minimal.   Objective: Blood pressure 116/65, pulse (!) 54, temperature (!) 97.4 F (36.3 C), temperature source Oral, resp. rate 18, height 5\' 6"  (1.676 m), weight 120.7 kg (266 lb), SpO2 100 %, unknown if currently breastfeeding.  Physical Exam:  General: alert, cooperative and no distress Chest: no respiratory distress Abdomen: soft Uterine Fundus: firm, appropriately tender DVT Evaluation: No calf swelling or tenderness Extremities: Minimal edema Skin: warm, dry  Recent Labs    06/22/17 0602  HGB 10.4*  HCT 32.5*    Assessment/Plan: Sherry Spence is a 27 y.o. H4L9379 s/p SVD at [redacted]w[redacted]d   PPD#1 - Doing well Contraception: Nexplanon Feeding: Bottle Dispo: Plan for discharge tomorrow.   LOS: 1 day   Devonne Doughty MS3 06/23/2017, 7:33 AM

## 2017-06-24 MED ORDER — IBUPROFEN 600 MG PO TABS: 600.0000 mg | ORAL_TABLET | Freq: Four times a day (QID) | ORAL | 0 refills | Status: DC | PRN

## 2017-06-24 NOTE — Discharge Summary (Signed)
OB Discharge Summary     Patient Name: Sherry Spence DOB: 1990-09-29 MRN: [id number]  Date of admission: 06/22/2017 Delivering MD: Larwance Rote   Date of discharge: 06/24/2017  Admitting diagnosis: 29 WEEKS CTX Intrauterine pregnancy: [redacted]w[redacted]d     Secondary diagnosis:  Principal Problem:   SVD (spontaneous vaginal delivery) Active Problems:   Obesity in pregnancy   Supervision of high risk pregnancy, antepartum   Short interval between pregnancies affecting pregnancy in first trimester, antepartum   Polyhydramnios affecting pregnancy in third trimester   Vaginal delivery  Additional problems: none     Discharge diagnosis: Term Pregnancy Delivered                                                                                                Post partum procedures:none  Augmentation: AROM  Complications: None  Hospital course:  Onset of Labor With Vaginal Delivery     27 y.o. yo G3P3003 at [redacted]w[redacted]d was admitted in Active Labor on 06/22/2017. Patient had an uncomplicated labor course as follows:  Membrane Rupture Time/Date: 6:49 AM ,06/22/2017   Intrapartum Procedures: Episiotomy: None [1]                                         Lacerations:  None [1]  Patient had a delivery of a Viable infant. 06/22/2017  Information for the patient's newborn:  Corliss, Coggeshall [[id number]]  Delivery Method: Vaginal, Spontaneous(Filed from Delivery Summary)    Pateint had an uncomplicated postpartum course.  She is ambulating, tolerating a regular diet, passing flatus, and urinating well. Patient is discharged home in stable condition on 06/24/17.   Physical exam  Vitals:   06/22/17 1429 06/22/17 1753 06/23/17 0500 06/23/17 1812  BP: 120/61 123/78 116/65 129/81  Pulse: (!) 59 72 (!) 54 79  Resp: 18 18 18 19   Temp: 97.9 F (36.6 C) 97.9 F (36.6 C) (!) 97.4 F (36.3 C) 98.3 F (36.8 C)  TempSrc: Oral Oral Oral Oral  SpO2:  100%  100%  Weight:      Height:       General:  alert and cooperative Lochia: appropriate Uterine Fundus: firm Incision: N/A DVT Evaluation: No evidence of DVT seen on physical exam. Labs: Lab Results  Component Value Date   WBC 8.5 06/22/2017   HGB 10.4 (L) 06/22/2017   HCT 32.5 (L) 06/22/2017   MCV 75.6 (L) 06/22/2017   PLT 393 06/22/2017   CMP Latest Ref Rng & Units 04/12/2017  Glucose 65 - 99 mg/dL 80  BUN 6 - 20 mg/dL 4(L)  Creatinine 0.57 - 1.00 mg/dL 0.42(L)  Sodium 134 - 144 mmol/L 139  Potassium 3.5 - 5.2 mmol/L 3.9  Chloride 96 - 106 mmol/L 107(H)  CO2 20 - 29 mmol/L 19(L)  Calcium 8.7 - 10.2 mg/dL 8.0(L)  Total Protein 6.0 - 8.5 g/dL 6.2  Total Bilirubin 0.0 - 1.2 mg/dL 0.3  Alkaline Phos 39 - 117 IU/L 155(H)  AST 0 - 40 IU/L  18  ALT 0 - 32 IU/L 21    Discharge instruction: per After Visit Summary and "Baby and Me Booklet".  After visit meds:  Allergies as of 06/24/2017      Reactions   Compazine [prochlorperazine Edisylate] Anxiety      Medication List    STOP taking these medications   ABDOMINAL BINDER 10" Misc   acetaminophen 500 MG tablet Commonly known as:  TYLENOL   Doxylamine-Pyridoxine ER 20-20 MG Tbcr Commonly known as:  BONJESTA   ZANTAC PO     TAKE these medications   ibuprofen 600 MG tablet Commonly known as:  ADVIL,MOTRIN Take 1 tablet (600 mg total) by mouth every 6 (six) hours as needed.   PNV PO Take by mouth.       Diet: routine diet  Activity: Advance as tolerated. Pelvic rest for 6 weeks.   Outpatient follow up:4 weeks Follow up Appt: Future Appointments  Date Time Provider [place]  07/25/2017  9:00 AM Donnamae Jude, MD [facility]   Follow up Visit:No Follow-up on file.  Postpartum contraception: Nexplanon  Newborn Data: Live born female  Birth Weight: 8 lb 0.6 oz (3646 g) APGAR: 70, 9  Newborn Delivery   Birth date/time:  06/22/2017 07:19:00 Delivery type:  Vaginal, Spontaneous     Baby Feeding: Bottle Disposition:home with  mother   06/24/2017 Serita Grammes, CNM 4:48 AM

## 2017-06-24 NOTE — Discharge Instructions (Signed)
Vaginal Delivery, Care After °Refer to this sheet in the next few weeks. These instructions provide you with information about caring for yourself after vaginal delivery. Your health care provider may also give you more specific instructions. Your treatment has been planned according to current medical practices, but problems sometimes occur. Call your health care provider if you have any problems or questions. °What can I expect after the procedure? °After vaginal delivery, it is common to have: °· Some bleeding from your vagina. °· Soreness in your abdomen, your vagina, and the area of skin between your vaginal opening and your anus (perineum). °· Pelvic cramps. °· Fatigue. ° °Follow these instructions at home: °Medicines °· Take over-the-counter and prescription medicines only as told by your health care provider. °· If you were prescribed an antibiotic medicine, take it as told by your health care provider. Do not stop taking the antibiotic until it is finished. °Driving ° °· Do not drive or operate heavy machinery while taking prescription pain medicine. °· Do not drive for 24 hours if you received a sedative. °Lifestyle °· Do not drink alcohol. This is especially important if you are breastfeeding or taking medicine to relieve pain. °· Do not use tobacco products, including cigarettes, chewing tobacco, or e-cigarettes. If you need help quitting, ask your health care provider. °Eating and drinking °· Drink at least 8 eight-ounce glasses of water every day unless you are told not to by your health care provider. If you choose to breastfeed your baby, you may need to drink more water than this. °· Eat high-fiber foods every day. These foods may help prevent or relieve constipation. High-fiber foods include: °? Whole grain cereals and breads. °? Brown rice. °? Beans. °? Fresh fruits and vegetables. °Activity °· Return to your normal activities as told by your health care provider. Ask your health care provider  what activities are safe for you. °· Rest as much as possible. Try to rest or take a nap when your baby is sleeping. °· Do not lift anything that is heavier than your baby or 10 lb (4.5 kg) until your health care provider says that it is safe. °· Talk with your health care provider about when you can engage in sexual activity. This may depend on your: °? Risk of infection. °? Rate of healing. °? Comfort and desire to engage in sexual activity. °Vaginal Care °· If you have an episiotomy or a vaginal tear, check the area every day for signs of infection. Check for: °? More redness, swelling, or pain. °? More fluid or blood. °? Warmth. °? Pus or a bad smell. °· Do not use tampons or douches until your health care provider says this is safe. °· Watch for any blood clots that may pass from your vagina. These may look like clumps of dark red, brown, or black discharge. °General instructions °· Keep your perineum clean and dry as told by your health care provider. °· Wear loose, comfortable clothing. °· Wipe from front to back when you use the toilet. °· Ask your health care provider if you can shower or take a bath. If you had an episiotomy or a perineal tear during labor and delivery, your health care provider may tell you not to take baths for a certain length of time. °· Wear a bra that supports your breasts and fits you well. °· If possible, have someone help you with household activities and help care for your baby for at least a few days after   you leave the hospital. °· Keep all follow-up visits for you and your baby as told by your health care provider. This is important. °Contact a health care provider if: °· You have: °? Vaginal discharge that has a bad smell. °? Difficulty urinating. °? Pain when urinating. °? A sudden increase or decrease in the frequency of your bowel movements. °? More redness, swelling, or pain around your episiotomy or vaginal tear. °? More fluid or blood coming from your episiotomy or  vaginal tear. °? Pus or a bad smell coming from your episiotomy or vaginal tear. °? A fever. °? A rash. °? Little or no interest in activities you used to enjoy. °? Questions about caring for yourself or your baby. °· Your episiotomy or vaginal tear feels warm to the touch. °· Your episiotomy or vaginal tear is separating or does not appear to be healing. °· Your breasts are painful, hard, or turn red. °· You feel unusually sad or worried. °· You feel nauseous or you vomit. °· You pass large blood clots from your vagina. If you pass a blood clot from your vagina, save it to show to your health care provider. Do not flush blood clots down the toilet without having your health care provider look at them. °· You urinate more than usual. °· You are dizzy or light-headed. °· You have not breastfed at all and you have not had a menstrual period for 12 weeks after delivery. °· You have stopped breastfeeding and you have not had a menstrual period for 12 weeks after you stopped breastfeeding. °Get help right away if: °· You have: °? Pain that does not go away or does not get better with medicine. °? Chest pain. °? Difficulty breathing. °? Blurred vision or spots in your vision. °? Thoughts about hurting yourself or your baby. °· You develop pain in your abdomen or in one of your legs. °· You develop a severe headache. °· You faint. °· You bleed from your vagina so much that you fill two sanitary pads in one hour. °This information is not intended to replace advice given to you by your health care provider. Make sure you discuss any questions you have with your health care provider. °Document Released: 05/06/2000 Document Revised: 10/21/2015 Document Reviewed: 05/24/2015 °Elsevier Interactive Patient Education © 2018 Elsevier Inc. ° °

## 2017-06-26 ENCOUNTER — Telehealth: Payer: Self-pay

## 2017-06-26 ENCOUNTER — Other Ambulatory Visit: Payer: Self-pay

## 2017-06-26 MED ORDER — IBUPROFEN 800 MG PO TABS: 800.0000 mg | ORAL_TABLET | Freq: Three times a day (TID) | ORAL | 1 refills | Status: DC | PRN

## 2017-06-26 NOTE — Telephone Encounter (Signed)
Call patient she is requesting 800 mg of motrin be called into her pharmacy to help with Cramping. Ok per Dr.pratt to called in.

## 2017-06-26 NOTE — Telephone Encounter (Signed)
Patient would like to know if there is a medication you can send to the pharmacy to dry up her milk?

## 2017-06-27 ENCOUNTER — Inpatient Hospital Stay (HOSPITAL_COMMUNITY): Payer: Medicaid Other

## 2017-07-25 ENCOUNTER — Ambulatory Visit: Payer: Medicaid Other | Admitting: Family Medicine

## 2017-07-25 ENCOUNTER — Encounter: Payer: Self-pay | Admitting: Family Medicine

## 2017-07-25 NOTE — Progress Notes (Deleted)
Post Partum Exam  Sherry Spence is a 27 y.o. G60P3003 female who presents for a postpartum visit. She is 5 weeks postpartum following a spontaneous vaginal delivery. I have fully reviewed the prenatal and intrapartum course. The delivery was at 38.4 gestational weeks.  Anesthesia: epidural. Postpartum course has been ***. Baby's course has been ***. Baby is feeding by {breast/bottle:69}. Bleeding {vag bleed:12292}. Bowel function is {normal:32111}. Bladder function is {normal:32111}. Patient {is/is not:9024} sexually active. Contraception method is {contraceptive method:5051}. Postpartum depression screening:neg  {Common ambulatory SmartLinks:19316} Last pap smear done *** and was {Desc; normal/abnormal:11317::"Normal"}  Review of Systems {ros; complete:30496}    Objective:  unknown if currently breastfeeding.  General:  {gen appearance:16600}   Breasts:  {breast exam:1202::"inspection negative, no nipple discharge or bleeding, no masses or nodularity palpable"}  Lungs: {lung exam:16931}  Heart:  {heart exam:5510}  Abdomen: {abdomen exam:16834}   Vulva:  {labia exam:12198}  Vagina: {vagina exam:12200}  Cervix:  {cervix exam:14595}  Corpus: {uterus exam:12215}  Adnexa:  {adnexa exam:12223}  Rectal Exam: {rectal/vaginal exam:12274}        Assessment:    *** postpartum exam. Pap smear {done:10129} at today's visit.   Plan:   1. Contraception: {method:5051} 2. *** 3. Follow up in: {1-10:13787} {time; units:19136} or as needed.

## 2017-07-25 NOTE — Progress Notes (Signed)
Patient did not keep appointment today. She will be called to reschedule.  

## 2017-08-01 ENCOUNTER — Ambulatory Visit: Payer: Medicaid Other | Admitting: Obstetrics and Gynecology

## 2017-08-10 ENCOUNTER — Ambulatory Visit (INDEPENDENT_AMBULATORY_CARE_PROVIDER_SITE_OTHER): Payer: Medicaid Other | Admitting: Obstetrics and Gynecology

## 2017-08-10 ENCOUNTER — Encounter: Payer: Self-pay | Admitting: Radiology

## 2017-08-10 ENCOUNTER — Encounter: Payer: Self-pay | Admitting: Obstetrics and Gynecology

## 2017-08-10 DIAGNOSIS — Z1389 Encounter for screening for other disorder: Secondary | ICD-10-CM

## 2017-08-10 NOTE — Progress Notes (Signed)
Post Partum Exam  Sherry Spence is a 27 y.o. G71P3003 female who presents for a postpartum visit. She is seven weeks postpartum following a vaginal delivery}. I have fully reviewed the prenatal and intrapartum course. The delivery was at 38.4 gestational weeks.  Anesthesia: epidual. Postpartum course has been uncomplicated. Baby's course has been uncomplicated. Baby is feeding by bottle. Bleeding not at this time. Bowel function is normal. Bladder function is normal. Patient is sexually active. Contraception method is nexplanon. Postpartum depression screening:negative

## 2017-08-10 NOTE — Progress Notes (Signed)
Obstetrics Visit Postpartum Visit  Appointment Date: 08/10/2017  OBGYN Clinic: [facility]  Primary Care Provider: Patient, No Pcp Per  Chief Complaint:  Chief Complaint  Patient presents with  . Postpartum Care    History of Present Illness: Sherry Spence is a 27 y.o. African-American G3P3003 (No LMP recorded.), seen for the above chief complaint. Her past medical history is significant for BMI 40s   She is s/p SVD/intact perineum on 06/22/17; she was discharged to home on PPD#2  Vaginal bleeding or discharge: No  Breast or formula feeding: formula Intercourse: Yes, 4 days ago. No issues. Pt states her period ended on Saturday and she had sex on sunday Contraception after delivery: No  PP depression s/s: No  Any bowel or bladder issues: No  Pap smear: no abnormalities (date: 2018)  Review of Systems:  as noted in the History of Present Illness.  Patient Active Problem List   Diagnosis Date Noted  . BMI 40.0-44.9, adult ([place]) 03/18/2016  . Obesity in pregnancy 03/18/2016    Medications [name] had no medications administered during this visit. Current Outpatient Medications  Medication Sig Dispense Refill  . ibuprofen (ADVIL,MOTRIN) 800 MG tablet Take 1 tablet (800 mg total) by mouth every 8 (eight) hours as needed. 30 tablet 1  . Prenatal Vit w/Fe-Methylfol-FA (PNV PO) Take by mouth.     No current facility-administered medications for this visit.     Allergies Compazine [prochlorperazine edisylate]  Physical Exam:  BP 134/81   Pulse 80  There is no height or weight on file to calculate BMI. General appearance: Well nourished, well developed female in no acute distress.   Laboratory: UPT neg  PP Depression Screening:  EPDS 0  Assessment: pt doing well  Plan:  Recommend abstinence and brining back in a week and if UPT still negative, to go ahead with nexplanon. Pt only has used pills before and forgot to take them and  got pregnant  RTC 8 d for UPT and nexplanon insertion  Durene Romans MD Attending [facility] [employer])

## 2017-08-17 ENCOUNTER — Ambulatory Visit (INDEPENDENT_AMBULATORY_CARE_PROVIDER_SITE_OTHER): Payer: Medicaid Other | Admitting: Family Medicine

## 2017-08-17 ENCOUNTER — Encounter: Payer: Self-pay | Admitting: Family Medicine

## 2017-08-17 VITALS — BP 143/85 | HR 72 | Wt 251.0 lb

## 2017-08-17 DIAGNOSIS — Z30017 Encounter for initial prescription of implantable subdermal contraceptive: Secondary | ICD-10-CM

## 2017-08-17 DIAGNOSIS — Z3049 Encounter for surveillance of other contraceptives: Secondary | ICD-10-CM | POA: Diagnosis not present

## 2017-08-17 DIAGNOSIS — Z3202 Encounter for pregnancy test, result negative: Secondary | ICD-10-CM | POA: Diagnosis not present

## 2017-08-17 LAB — POCT URINE PREGNANCY: Preg Test, Ur: NEGATIVE

## 2017-08-17 MED ORDER — ETONOGESTREL 68 MG ~~LOC~~ IMPL
68.0000 mg | DRUG_IMPLANT | Freq: Once | SUBCUTANEOUS | Status: AC
  Administered 2017-08-17: 68 mg via SUBCUTANEOUS

## 2017-08-17 NOTE — Patient Instructions (Signed)
Etonogestrel implant What is this medicine? ETONOGESTREL (et oh noe JES trel) is a contraceptive (birth control) device. It is used to prevent pregnancy. It can be used for up to 3 years. This medicine may be used for other purposes; ask your health care provider or pharmacist if you have questions. COMMON BRAND NAME(S): Implanon, Nexplanon What should I tell my health care provider before I take this medicine? They need to know if you have any of these conditions: -abnormal vaginal bleeding -blood vessel disease or blood clots -cancer of the breast, cervix, or liver -depression -diabetes -gallbladder disease -headaches -heart disease or recent heart attack -high blood pressure -high cholesterol -kidney disease -liver disease -renal disease -seizures -tobacco smoker -an unusual or allergic reaction to etonogestrel, other hormones, anesthetics or antiseptics, medicines, foods, dyes, or preservatives -pregnant or trying to get pregnant -breast-feeding How should I use this medicine? This device is inserted just under the skin on the inner side of your upper arm by a health care professional. Talk to your pediatrician regarding the use of this medicine in children. Special care may be needed. Overdosage: If you think you have taken too much of this medicine contact a poison control center or emergency room at once. NOTE: This medicine is only for you. Do not share this medicine with others. What if I miss a dose? This does not apply. What may interact with this medicine? Do not take this medicine with any of the following medications: -amprenavir -bosentan -fosamprenavir This medicine may also interact with the following medications: -barbiturate medicines for inducing sleep or treating seizures -certain medicines for fungal infections like ketoconazole and itraconazole -grapefruit juice -griseofulvin -medicines to treat seizures like carbamazepine, felbamate, oxcarbazepine,  phenytoin, topiramate -modafinil -phenylbutazone -rifampin -rufinamide -some medicines to treat HIV infection like atazanavir, indinavir, lopinavir, nelfinavir, tipranavir, ritonavir -St. John's wort This list may not describe all possible interactions. Give your health care provider a list of all the medicines, herbs, non-prescription drugs, or dietary supplements you use. Also tell them if you smoke, drink alcohol, or use illegal drugs. Some items may interact with your medicine. What should I watch for while using this medicine? This product does not protect you against HIV infection (AIDS) or other sexually transmitted diseases. You should be able to feel the implant by pressing your fingertips over the skin where it was inserted. Contact your doctor if you cannot feel the implant, and use a non-hormonal birth control method (such as condoms) until your doctor confirms that the implant is in place. If you feel that the implant may have broken or become bent while in your arm, contact your healthcare provider. What side effects may I notice from receiving this medicine? Side effects that you should report to your doctor or health care professional as soon as possible: -allergic reactions like skin rash, itching or hives, swelling of the face, lips, or tongue -breast lumps -changes in emotions or moods -depressed mood -heavy or prolonged menstrual bleeding -pain, irritation, swelling, or bruising at the insertion site -scar at site of insertion -signs of infection at the insertion site such as fever, and skin redness, pain or discharge -signs of pregnancy -signs and symptoms of a blood clot such as breathing problems; changes in vision; chest pain; severe, sudden headache; pain, swelling, warmth in the leg; trouble speaking; sudden numbness or weakness of the face, arm or leg -signs and symptoms of liver injury like dark yellow or brown urine; general ill feeling or flu-like symptoms;  light-colored   stools; loss of appetite; nausea; right upper belly pain; unusually weak or tired; yellowing of the eyes or skin -unusual vaginal bleeding, discharge -signs and symptoms of a stroke like changes in vision; confusion; trouble speaking or understanding; severe headaches; sudden numbness or weakness of the face, arm or leg; trouble walking; dizziness; loss of balance or coordination Side effects that usually do not require medical attention (report to your doctor or health care professional if they continue or are bothersome): -acne -back pain -breast pain -changes in weight -dizziness -general ill feeling or flu-like symptoms -headache -irregular menstrual bleeding -nausea -sore throat -vaginal irritation or inflammation This list may not describe all possible side effects. Call your doctor for medical advice about side effects. You may report side effects to FDA at 1-800-FDA-1088. Where should I keep my medicine? This drug is given in a hospital or clinic and will not be stored at home. NOTE: This sheet is a summary. It may not cover all possible information. If you have questions about this medicine, talk to your doctor, pharmacist, or health care provider.  2018 Elsevier/Gold Standard (2015-11-26 11:19:22)  

## 2017-08-17 NOTE — Progress Notes (Signed)
[place]

## 2017-08-17 NOTE — Progress Notes (Signed)
    Subjective:    Patient ID: Sherry Spence is a 27 y.o. female presenting with nexplanon insertion  on 08/17/2017  HPI: She is 7 wks post SVD. Wants Nexplanon. No intercourse x 1 wk. Neg UPT.  Review of Systems  Constitutional: Negative for chills and fever.  Respiratory: Negative for shortness of breath.   Cardiovascular: Negative for chest pain.  Gastrointestinal: Negative for abdominal pain, nausea and vomiting.  Genitourinary: Negative for dysuria.  Skin: Negative for rash.      Objective:    BP (!) 143/85   Pulse 72   Wt 251 lb (113.9 kg)   BMI 40.51 kg/m  Physical Exam  Constitutional: She is oriented to person, place, and time. She appears well-developed and well-nourished. No distress.  HENT:  Head: Normocephalic and atraumatic.  Eyes: No scleral icterus.  Neck: Neck supple.  Cardiovascular: Normal rate.  Pulmonary/Chest: Effort normal.  Abdominal: Soft.  Neurological: She is alert and oriented to person, place, and time.  Skin: Skin is warm and dry.  Psychiatric: She has a normal mood and affect.   Procedure: Patient given informed consent, signed copy in the chart, time out was performed. Pregnancy test was negative. Appropriate time out taken.  Patient's left arm was prepped and draped in the usual sterile fashion. The ruler used to measure and mark insertion area. Pt was prepped with alcohol swab and then injected with 3 cc of 1% lidocaine with epinephrine. Pt was prepped with betadine, Nexplanon removed from packaging,  Device confirmed in needle, then inserted full length of needle and withdrawn per handbook instructions. Pt insertion site covered with pressure dressing. Minimal blood loss. Pt tolerated the procedure well.       Assessment & Plan:  Nexplanon insertion - Reviewed common side effects - Plan: POCT urine pregnancy, etonogestrel (NEXPLANON) implant 68 mg  Return if symptoms worsen or fail to improve.  Donnamae Jude 08/17/2017 10:53  AM

## 2017-09-18 ENCOUNTER — Ambulatory Visit: Payer: Self-pay | Admitting: Unknown Physician Specialty

## 2017-11-10 ENCOUNTER — Other Ambulatory Visit (INDEPENDENT_AMBULATORY_CARE_PROVIDER_SITE_OTHER): Payer: Medicaid Other

## 2017-11-10 DIAGNOSIS — Z3202 Encounter for pregnancy test, result negative: Secondary | ICD-10-CM

## 2017-11-10 NOTE — Progress Notes (Signed)
Patient presented to the office today for a pregnancy test. Patient reports having some stomach pain and feeling nausea for a couple of days. UPT is negative at this time. Patient currently has the neplaxnon for birth control. I have advised patient to follow up with her pcp.

## 2017-11-21 ENCOUNTER — Encounter: Payer: Self-pay | Admitting: Obstetrics & Gynecology

## 2017-11-21 ENCOUNTER — Ambulatory Visit (INDEPENDENT_AMBULATORY_CARE_PROVIDER_SITE_OTHER): Payer: Medicaid Other | Admitting: Obstetrics & Gynecology

## 2017-11-21 VITALS — BP 123/78 | HR 72 | Wt 263.5 lb

## 2017-11-21 DIAGNOSIS — Z3046 Encounter for surveillance of implantable subdermal contraceptive: Secondary | ICD-10-CM

## 2017-11-21 NOTE — Progress Notes (Signed)
   GYNECOLOGY OFFICE VISIT NOTE  History:  27 y.o. [id number] here today for discussion of side effects of Nexplanon since placement on 08/17/17. Reports having periods twice a month, also some occasional episodes of cramping, nausea and back pain. No severe or significant symptoms.   She denies any current abnormal vaginal discharge, bleeding, pelvic pain or other concerns.   Past Medical History:  Diagnosis Date  . BMI 40.0-44.9, adult ([place]) 03/18/2016  . Cholelithiasis   . GERD (gastroesophageal reflux disease) 01/20/2014  . Headache   . History of chlamydia infection     Past Surgical History:  Procedure Laterality Date  . NO PAST SURGERIES      The following portions of the patient's history were reviewed and updated as appropriate: allergies, current medications, past family history, past medical history, past social history, past surgical history and problem list.   Health Maintenance:  Normal pap on 11/28/2016.  Review of Systems:  Pertinent items noted in HPI and remainder of comprehensive ROS otherwise negative.  Objective:  Physical Exam BP 123/78   Pulse 72   Wt 263 lb 8 oz (119.5 kg)   LMP  (LMP Unknown)   BMI 42.53 kg/m  CONSTITUTIONAL: Well-developed, well-nourished female in no acute distress.  MUSCULOSKELETAL: Normal range of motion. No edema noted. PSYCHIATRIC: Normal mood and affect. Normal behavior. Normal judgment and thought content. CARDIOVASCULAR: Normal heart rate noted RESPIRATORY: Effort and breath sounds normal, no problems with respiration noted ABDOMEN: Soft, no distention noted.   PELVIC: Deferred   Assessment & Plan:  1. Encounter for surveillance of Nexplanon subdermal contraceptive Patient reassured about symptoms, these are normal side-effects of Nexplanon. She will keep in place for now, return for worsening side effects.  Routine preventative health maintenance measures emphasized. Please refer to After Visit Summary for other counseling  recommendations.    Total face-to-face time with patient: 10 minutes.  Over 50% of encounter was spent on counseling and coordination of care.   Verita Schneiders, MD, [place] [facility] [employer], [place]

## 2017-11-21 NOTE — Patient Instructions (Signed)
Return to clinic for any scheduled appointments or for any gynecologic concerns as needed.   

## 2017-12-08 ENCOUNTER — Encounter: Payer: Self-pay | Admitting: Emergency Medicine

## 2017-12-08 ENCOUNTER — Ambulatory Visit
Admission: EM | Admit: 2017-12-08 | Discharge: 2017-12-08 | Disposition: A | Discharge status: Home / Self Care | Payer: Medicaid Other | Attending: Family Medicine | Admitting: Family Medicine

## 2017-12-08 ENCOUNTER — Other Ambulatory Visit: Payer: Self-pay

## 2017-12-08 DIAGNOSIS — H00015 Hordeolum externum left lower eyelid: Secondary | ICD-10-CM | POA: Diagnosis not present

## 2017-12-08 MED ORDER — POLYMYXIN B-TRIMETHOPRIM 10000-0.1 UNIT/ML-% OP SOLN: 1.0000 [drp] | Freq: Four times a day (QID) | OPHTHALMIC | 0 refills | Status: AC

## 2017-12-08 NOTE — ED Provider Notes (Signed)
[facility]    CSN: [id number] Arrival date & time: 12/08/17  1124  History   Chief Complaint Chief Complaint  Patient presents with  . Eye Problem   HPI  27 year old female presents with the above complaint.  Patient reports that she woke up this morning and had left lower eye swelling.  No eye redness.  No drainage.  Mild pain. She reports mild headache.  Otherwise her daughter has had a recent similar problem.  She is concerned that this may be contagious.  No medications or interventions tried.  No known exacerbating or relieving factors.  No visual disturbance.  No other complaints.  Past Medical History:  Diagnosis Date  . BMI 40.0-44.9, adult ([place]) 03/18/2016  . Cholelithiasis   . GERD (gastroesophageal reflux disease) 01/20/2014  . Headache   . History of chlamydia infection    Patient Active Problem List   Diagnosis Date Noted  . BMI 40.0-44.9, adult ([place]) 03/18/2016  . GERD (gastroesophageal reflux disease) 01/20/2014   Past Surgical History:  Procedure Laterality Date  . NO PAST SURGERIES     OB History    Gravida  3   Para  3   Term  3   Preterm  0   AB  0   Living  3     SAB  0   TAB  0   Ectopic  0   Multiple  0   Live Births  3          Home Medications    Prior to Admission medications   Medication Sig Start Date End Date Taking? Authorizing Provider  Etonogestrel (NEXPLANON [place]) Inject into the skin.   Yes [provider]  Prenatal Vit w/Fe-Methylfol-FA (PNV PO) Take by mouth.   Yes [provider]  trimethoprim-polymyxin b (POLYTRIM) ophthalmic solution Place 1 drop into the left eye every 6 (six) hours for 5 days. 12/08/17 12/13/17  Coral Spikes, DO    Family History Family History  Problem Relation Age of Onset  . Hypertension Father   . Diabetes Paternal Aunt   . Cancer Maternal Grandmother   . Diabetes Maternal Grandmother   . Dementia Maternal Grandmother   . Diabetes Paternal Grandmother    . Hypertension Paternal Grandmother   . Mental retardation Neg Hx     Social History Social History   Tobacco Use  . Smoking status: Never Smoker  . Smokeless tobacco: Never Used  Substance Use Topics  . Alcohol use: No  . Drug use: No     Allergies   Compazine [prochlorperazine edisylate]   Review of Systems Review of Systems  Constitutional: Negative.   Eyes: Positive for pain. Negative for visual disturbance.   Physical Exam Triage Vital Signs ED Triage Vitals  Enc Vitals Group     BP 12/08/17 1148 122/85     Pulse Rate 12/08/17 1148 75     Resp 12/08/17 1148 17     Temp 12/08/17 1148 98.9 F (37.2 C)     Temp Source 12/08/17 1148 Oral     SpO2 12/08/17 1148 100 %     Weight 12/08/17 1148 265 lb (120.2 kg)     Height 12/08/17 1148 5\' 7"  (1.702 m)     Head Circumference --      Peak Flow --      Pain Score 12/08/17 1147 2     Pain Loc --      Pain Edu? --  Excl. in [place]? --    Updated Vital Signs BP 122/85 (BP Location: Left Arm)   Pulse 75   Temp 98.9 F (37.2 C) (Oral)   Resp 17   Ht 5\' 7"  (1.702 m)   Wt 265 lb (120.2 kg)   LMP 11/20/2017   SpO2 100%   BMI 41.50 kg/m   Visual Acuity Right Eye Distance: 20/20 uncorrected Left Eye Distance: 20/25 uncorrected Bilateral Distance: 20/25 uncorrected  Right Eye Near:   Left Eye Near:    Bilateral Near:     Physical Exam  Constitutional: She is oriented to person, place, and time. She appears well-developed. No distress.  HENT:  Head: Normocephalic and atraumatic.  Eyes:  Left lower eyelid swelling.  Mild Tenderness to palpation.  No conjunctival injection.  No discharge.  Pulmonary/Chest: Effort normal. No respiratory distress.  Neurological: She is alert and oriented to person, place, and time.  Psychiatric: She has a normal mood and affect. Her behavior is normal.  Nursing note and vitals reviewed.  UC Treatments / Results  Labs (all labs ordered are listed, but only abnormal results  are displayed) Labs Reviewed - No data to display  EKG None  Radiology No results found.  Procedures Procedures (including critical care time)  Medications Ordered in UC Medications - No data to display  Initial Impression / Assessment and Plan / UC Course  I have reviewed the triage vital signs and the nursing notes.  Pertinent labs & imaging results that were available during my care of the patient were reviewed by me and considered in my medical decision making (see chart for details).    27 year old female presents with a suspected mild left lower stye.  Polytrim and warm compresses.  Final Clinical Impressions(s) / UC Diagnoses   Final diagnoses:  Hordeolum externum of left lower eyelid   Discharge Instructions   None    ED Prescriptions    Medication Sig Dispense Auth. Provider   trimethoprim-polymyxin b (POLYTRIM) ophthalmic solution Place 1 drop into the left eye every 6 (six) hours for 5 days. 10 mL Coral Spikes, DO     Controlled Substance Prescriptions [place] Controlled Substance Registry consulted? Not Applicable   Coral Spikes, DO 12/08/17 1317

## 2017-12-08 NOTE — ED Triage Notes (Signed)
Pt here c/o eye swelling. She woke up this way this morning. No eye redness. She also has a headache on the same side of her head. Her daughter had a swollen eye and she took her to the doctor and was thought to be a spider bite, but pt is concerned that it could be something contagious.

## 2018-02-21 ENCOUNTER — Other Ambulatory Visit (INDEPENDENT_AMBULATORY_CARE_PROVIDER_SITE_OTHER): Payer: Medicaid Other

## 2018-02-21 VITALS — BP 139/88 | HR 69 | Resp 16 | Ht 67.0 in | Wt 263.4 lb

## 2018-02-21 DIAGNOSIS — R300 Dysuria: Secondary | ICD-10-CM

## 2018-02-21 LAB — POCT URINALYSIS DIP (MANUAL ENTRY)
Bilirubin, UA: NEGATIVE
Glucose, UA: NEGATIVE mg/dL
Ketones, POC UA: NEGATIVE mg/dL
Nitrite, UA: NEGATIVE
Protein Ur, POC: 30 mg/dL — AB
Spec Grav, UA: >=1.03 — AB (ref 1.010–1.025)
Urobilinogen, UA: 2 E.U./dL — AB
pH, UA: 6 (ref 5.0–8.0)

## 2018-02-21 MED ORDER — FLUCONAZOLE 150 MG PO TABS: 150.0000 mg | ORAL_TABLET | Freq: Once | ORAL | 0 refills | Status: DC

## 2018-02-21 MED ORDER — CIPROFLOXACIN HCL 500 MG PO TABS: 500.0000 mg | ORAL_TABLET | Freq: Two times a day (BID) | ORAL | 0 refills | Status: DC

## 2018-02-21 MED ORDER — PHENAZOPYRIDINE HCL 200 MG PO TABS: 200.0000 mg | ORAL_TABLET | Freq: Three times a day (TID) | ORAL | 1 refills | Status: DC | PRN

## 2018-02-21 NOTE — Progress Notes (Signed)
SUBJECTIVE: Sherry Spence is a 27 y.o. female who complains of urinary frequency, urine pressure, urgency, bilateral flank pain and dysuria x 3 weeks. Patient denies fever, chills, or abnormal vaginal discharge or bleeding.   OBJECTIVE: Appears well, in no apparent distress.  Vital signs are normal. Urine dipstick shows positive for RBC's, positive for protein and positive for leukocytes.    ASSESSMENT: Dysuria, Bilateral flank pain  PLAN: Treatment per orders.  Call or return to clinic prn if these symptoms worsen or fail to improve as anticipated.

## 2018-02-21 NOTE — Progress Notes (Signed)
ATTESTATION OF SUPERVISION OF CMA: Evaluation and management procedures were performed by the CMA under my supervision and collaboration. I have reviewed the nursing note and chart and agree with the management and plan for this patient.  Mallie Snooks, CNM

## 2018-02-23 LAB — URINE CULTURE

## 2018-02-25 ENCOUNTER — Other Ambulatory Visit: Payer: Self-pay | Admitting: Advanced Practice Midwife

## 2018-08-21 ENCOUNTER — Other Ambulatory Visit: Payer: Self-pay

## 2018-08-21 ENCOUNTER — Encounter: Payer: Self-pay | Admitting: Emergency Medicine

## 2018-08-21 ENCOUNTER — Emergency Department
Admission: EM | Admit: 2018-08-21 | Discharge: 2018-08-21 | Disposition: A | Discharge status: Home / Self Care | Payer: Medicaid Other | Attending: Student in an Organized Health Care Education/Training Program | Admitting: Student in an Organized Health Care Education/Training Program

## 2018-08-21 DIAGNOSIS — Z79899 Other long term (current) drug therapy: Secondary | ICD-10-CM | POA: Insufficient documentation

## 2018-08-21 DIAGNOSIS — F419 Anxiety disorder, unspecified: Secondary | ICD-10-CM | POA: Diagnosis not present

## 2018-08-21 LAB — CBC WITH DIFFERENTIAL/PLATELET
Abs Immature Granulocytes: 0.02 10*3/uL (ref 0.00–0.07)
Basophils Absolute: 0 10*3/uL (ref 0.0–0.1)
Basophils Relative: 0 %
Eosinophils Absolute: 0.1 10*3/uL (ref 0.0–0.5)
Eosinophils Relative: 1 %
HCT: 35.8 % — ABNORMAL LOW (ref 36.0–46.0)
Hemoglobin: 11.3 g/dL — ABNORMAL LOW (ref 12.0–15.0)
Immature Granulocytes: 0 %
Lymphocytes Relative: 17 %
Lymphs Abs: 1.3 10*3/uL (ref 0.7–4.0)
MCH: 24.9 pg — ABNORMAL LOW (ref 26.0–34.0)
MCHC: 31.6 g/dL (ref 30.0–36.0)
MCV: 79 fL — ABNORMAL LOW (ref 80.0–100.0)
Monocytes Absolute: 0.4 10*3/uL (ref 0.1–1.0)
Monocytes Relative: 5 %
Neutro Abs: 5.8 10*3/uL (ref 1.7–7.7)
Neutrophils Relative %: 77 %
Platelets: 448 10*3/uL — ABNORMAL HIGH (ref 150–400)
RBC: 4.53 MIL/uL (ref 3.87–5.11)
RDW: 14.3 % (ref 11.5–15.5)
WBC: 7.5 10*3/uL (ref 4.0–10.5)
nRBC: 0 % (ref 0.0–0.2)

## 2018-08-21 LAB — COMPREHENSIVE METABOLIC PANEL
ALT: 60 U/L — ABNORMAL HIGH (ref 0–44)
AST: 37 U/L (ref 15–41)
Albumin: 3.6 g/dL (ref 3.5–5.0)
Alkaline Phosphatase: 128 U/L — ABNORMAL HIGH (ref 38–126)
Anion gap: 9 (ref 5–15)
BUN: 7 mg/dL (ref 6–20)
CO2: 22 mmol/L (ref 22–32)
Calcium: 8.9 mg/dL (ref 8.9–10.3)
Chloride: 106 mmol/L (ref 98–111)
Creatinine, Ser: 0.72 mg/dL (ref 0.44–1.00)
GFR calc Af Amer: >60 mL/min (ref >60)
GFR calc non Af Amer: >60 mL/min (ref >60)
Glucose, Bld: 92 mg/dL (ref 70–99)
Potassium: 3.3 mmol/L — ABNORMAL LOW (ref 3.5–5.1)
Sodium: 137 mmol/L (ref 135–145)
Total Bilirubin: 1.1 mg/dL (ref 0.3–1.2)
Total Protein: 8 g/dL (ref 6.5–8.1)

## 2018-08-21 LAB — POCT PREGNANCY, URINE: Preg Test, Ur: NEGATIVE

## 2018-08-21 MED ORDER — LORAZEPAM 0.5 MG PO TABS: 0.5000 mg | ORAL_TABLET | Freq: Three times a day (TID) | ORAL | 0 refills | Status: DC | PRN

## 2018-08-21 MED ORDER — LORAZEPAM 1 MG PO TABS
1.0000 mg | ORAL_TABLET | Freq: Once | ORAL | Status: AC
  Administered 2018-08-21: 1 mg via ORAL
  Filled 2018-08-21: qty 1

## 2018-08-21 NOTE — ED Provider Notes (Signed)
[facility] Emergency Department Provider Note    First MD Initiated Contact with Patient 08/21/18 [phone]     (approximate)  I have reviewed the triage vital signs and the nursing notes.   HISTORY  Chief Complaint Anxiety    HPI Sherry Spence is a 28 y.o. female the below listed past medical history presents the ER for evaluation of jitteriness anxiety and panic for the past 3 days.  States that she is feeling overwhelmed at home.  They have been isolating themselves due to concern for coronavirus.  States that she is frequently finding herself having to step out of the room around her kids to try to calm her breathing.  States that she is had decreased oral intake.  Denies any SI or HI.  No fevers.  No cough or shortness of breath.  She has no infectious symptoms.  Denies any history of anxiety depression.  Past Medical History:  Diagnosis Date  . BMI 40.0-44.9, adult ([place]) 03/18/2016  . Cholelithiasis   . GERD (gastroesophageal reflux disease) 01/20/2014  . Headache   . History of chlamydia infection    Family History  Problem Relation Age of Onset  . Hypertension Father   . Diabetes Paternal Aunt   . Cancer Maternal Grandmother   . Diabetes Maternal Grandmother   . Dementia Maternal Grandmother   . Diabetes Paternal Grandmother   . Hypertension Paternal Grandmother   . Mental retardation Neg Hx    Past Surgical History:  Procedure Laterality Date  . NO PAST SURGERIES     Patient Active Problem List   Diagnosis Date Noted  . BMI 40.0-44.9, adult ([place]) 03/18/2016  . GERD (gastroesophageal reflux disease) 01/20/2014      Prior to Admission medications   Medication Sig Start Date End Date Taking? Authorizing Provider  Etonogestrel (NEXPLANON [place]) Inject into the skin.    [provider]  LORazepam (ATIVAN) 0.5 MG tablet Take 1 tablet (0.5 mg total) by mouth every 8 (eight) hours as needed for anxiety. 08/21/18 08/21/19  Merlyn Lot, MD    Allergies Compazine [prochlorperazine edisylate]    Social History Social History   Tobacco Use  . Smoking status: Never Smoker  . Smokeless tobacco: Never Used  Substance Use Topics  . Alcohol use: No  . Drug use: No    Review of Systems Patient denies headaches, rhinorrhea, blurry vision, numbness, shortness of breath, chest pain, edema, cough, abdominal pain, nausea, vomiting, diarrhea, dysuria, fevers, rashes or hallucinations unless otherwise stated above in HPI. ____________________________________________   PHYSICAL EXAM:  VITAL SIGNS: Vitals:   08/21/18 0833  BP: 131/69  Pulse: (!) 104  Resp: 18  Temp: 98 F (36.7 C)  SpO2: 100%    Constitutional: Alert and oriented. Well appearing but very anxious and tearful Eyes: Conjunctivae are normal.  Head: Atraumatic. Nose: No congestion/rhinnorhea. Mouth/Throat: Mucous membranes are moist.   Neck: Painless ROM.  Cardiovascular:   Good peripheral circulation. Respiratory: Normal respiratory effort.  No retractions.  Gastrointestinal: Soft and nontender.  Musculoskeletal: No lower extremity tenderness .  No joint effusions. Neurologic:  Normal speech and language. No gross focal neurologic deficits are appreciated.  Skin:  Skin is warm, dry and intact. No rash noted. Psychiatric: Mood and affect are anxious. Speech and behavior are normal.  ____________________________________________   LABS (all labs ordered are listed, but only abnormal results are displayed)  Results for orders placed or performed during the hospital encounter of 08/21/18 (from the  past 24 hour(s))  CBC with Differential/Platelet     Status: Abnormal   Collection Time: 08/21/18  9:35 AM  Result Value Ref Range   WBC 7.5 4.0 - 10.5 K/uL   RBC 4.53 3.87 - 5.11 MIL/uL   Hemoglobin 11.3 (L) 12.0 - 15.0 g/dL   HCT 35.8 (L) 36.0 - 46.0 %   MCV 79.0 (L) 80.0 - 100.0 fL   MCH 24.9 (L) 26.0 - 34.0 pg   MCHC 31.6 30.0 - 36.0  g/dL   RDW 14.3 11.5 - 15.5 %   Platelets 448 (H) 150 - 400 K/uL   nRBC 0.0 0.0 - 0.2 %   Neutrophils Relative % 77 %   Neutro Abs 5.8 1.7 - 7.7 K/uL   Lymphocytes Relative 17 %   Lymphs Abs 1.3 0.7 - 4.0 K/uL   Monocytes Relative 5 %   Monocytes Absolute 0.4 0.1 - 1.0 K/uL   Eosinophils Relative 1 %   Eosinophils Absolute 0.1 0.0 - 0.5 K/uL   Basophils Relative 0 %   Basophils Absolute 0.0 0.0 - 0.1 K/uL   Immature Granulocytes 0 %   Abs Immature Granulocytes 0.02 0.00 - 0.07 K/uL  Comprehensive metabolic panel     Status: Abnormal   Collection Time: 08/21/18  9:35 AM  Result Value Ref Range   Sodium 137 135 - 145 mmol/L   Potassium 3.3 (L) 3.5 - 5.1 mmol/L   Chloride 106 98 - 111 mmol/L   CO2 22 22 - 32 mmol/L   Glucose, Bld 92 70 - 99 mg/dL   BUN 7 6 - 20 mg/dL   Creatinine, Ser 0.72 0.44 - 1.00 mg/dL   Calcium 8.9 8.9 - 10.3 mg/dL   Total Protein 8.0 6.5 - 8.1 g/dL   Albumin 3.6 3.5 - 5.0 g/dL   AST 37 15 - 41 U/L   ALT 60 (H) 0 - 44 U/L   Alkaline Phosphatase 128 (H) 38 - 126 U/L   Total Bilirubin 1.1 0.3 - 1.2 mg/dL   GFR calc non Af Amer >60 >60 mL/min   GFR calc Af Amer >60 >60 mL/min   Anion gap 9 5 - 15  Pregnancy, urine POC     Status: None   Collection Time: 08/21/18  9:41 AM  Result Value Ref Range   Preg Test, Ur NEGATIVE NEGATIVE   ____________________________________________ ____________________________________________  RADIOLOGY   ____________________________________________   PROCEDURES  Procedure(s) performed:  Procedures    Critical Care performed: no ____________________________________________   INITIAL IMPRESSION / ASSESSMENT AND PLAN / ED COURSE  Pertinent labs & imaging results that were available during my care of the patient were reviewed by me and considered in my medical decision making (see chart for details).  DDX: Anxiety, panic, depression, acute stress reaction, electrolyte abnormality, pregnancy  Sherry Spence is a 28 y.o. who presents to the ED with symptoms as described above concerning for panic attack.  Will order blood work to evaluate his the patient states that she is not having anything to eat or drink for the past 3 days but her abdominal exam soft and benign.  No fevers.  Doubt infectious process.  Clinical Course as of Aug 20 1037  Tue Aug 21, 2018  1020 Patient reassessed and feels much better.  We will give her a short prescription for low-dose Ativan.  Her blood work is reassuring.  Discussed mildly low potassium and nutritional supplements to help with that.  Have discussed with the patient  and available family all diagnostics and treatments performed thus far and all questions were answered to the best of my ability. The patient demonstrates understanding and agreement with plan.    [PR]    Clinical Course User Index [PR] Merlyn Lot, MD     ____________________________________________   FINAL CLINICAL IMPRESSION(S) / ED DIAGNOSES  Final diagnoses:  Anxiety      NEW MEDICATIONS STARTED DURING THIS VISIT:  New Prescriptions   LORAZEPAM (ATIVAN) 0.5 MG TABLET    Take 1 tablet (0.5 mg total) by mouth every 8 (eight) hours as needed for anxiety.     Note:  This document was prepared using Dragon voice recognition software and may include unintentional dictation errors.     Merlyn Lot, MD 08/21/18 1039

## 2018-08-21 NOTE — ED Notes (Signed)
Patient denies pain and is resting comfortably.  

## 2018-08-21 NOTE — ED Triage Notes (Signed)
Presents feeling anxious  Tearful   States she developed theses feeling last weds  States she is not able to eat d/t nerves denies any fever and nausea states she has had some diarrhea and vomiting  Last time vomited was about 5 am

## 2018-08-21 NOTE — ED Notes (Addendum)
Pt st she has a ride  Home ("husband"). Pt is waiting in room for her ride.

## 2018-08-28 ENCOUNTER — Other Ambulatory Visit: Payer: Self-pay | Admitting: *Deleted

## 2018-08-28 DIAGNOSIS — F419 Anxiety disorder, unspecified: Secondary | ICD-10-CM

## 2018-11-15 ENCOUNTER — Encounter

## 2018-11-15 ENCOUNTER — Other Ambulatory Visit: Payer: Self-pay

## 2018-11-15 ENCOUNTER — Encounter: Payer: Self-pay | Admitting: Family Medicine

## 2018-11-15 ENCOUNTER — Ambulatory Visit (INDEPENDENT_AMBULATORY_CARE_PROVIDER_SITE_OTHER): Payer: Medicaid Other | Admitting: Family Medicine

## 2018-11-15 DIAGNOSIS — H1013 Acute atopic conjunctivitis, bilateral: Secondary | ICD-10-CM | POA: Diagnosis not present

## 2018-11-15 DIAGNOSIS — J302 Other seasonal allergic rhinitis: Secondary | ICD-10-CM | POA: Diagnosis not present

## 2018-11-15 DIAGNOSIS — K219 Gastro-esophageal reflux disease without esophagitis: Secondary | ICD-10-CM | POA: Diagnosis not present

## 2018-11-15 DIAGNOSIS — R062 Wheezing: Secondary | ICD-10-CM

## 2018-11-15 MED ORDER — OLOPATADINE HCL 0.2 % OP SOLN: 1.0000 [drp] | Freq: Every day | OPHTHALMIC | 2 refills | Status: DC

## 2018-11-15 MED ORDER — FLUTICASONE PROPIONATE 50 MCG/ACT NA SUSP: 2.0000 | Freq: Every day | NASAL | 6 refills | Status: DC

## 2018-11-15 MED ORDER — ALBUTEROL SULFATE HFA 108 (90 BASE) MCG/ACT IN AERS: 2.0000 | INHALATION_SPRAY | Freq: Four times a day (QID) | RESPIRATORY_TRACT | 1 refills | Status: DC | PRN

## 2018-11-15 MED ORDER — FAMOTIDINE 20 MG PO TABS: 20.0000 mg | ORAL_TABLET | Freq: Every day | ORAL | 0 refills | Status: DC

## 2018-11-15 MED ORDER — CETIRIZINE HCL 10 MG PO TABS: 10.0000 mg | ORAL_TABLET | Freq: Every day | ORAL | 11 refills | Status: DC

## 2018-11-15 NOTE — Progress Notes (Signed)
Name: Sherry Spence   MRN: [id number]    DOB: October 26, 1990   Date:11/15/2018       Progress Note  Subjective  Chief Complaint  Chief Complaint  Patient presents with  . Establish Care  . Allergies    facial and eye swelling and itchy eyes    I connected with  [name]  on 11/15/18 at 10:00 AM EDT by a video enabled telemedicine application and verified that I am speaking with the correct person using two identifiers.  I discussed the limitations of evaluation and management by telemedicine and the availability of in person appointments. The patient expressed understanding and agreed to proceed. Staff also discussed with the patient that there may be a patient responsible charge related to this service. Patient Location: Home Provider Location: Home Additional Individuals present: None  HPI  Pt presents to establish care and for the following concerns  Allergies: She notes history of allergies as a kid - would get eyelid swelling and apply cold compresses and they would decrease in swelling.  This month she has started waking up to the same swelling.  She did a telehealth visit and was given prednisone for 5 days which did seem to help.  She is also using flonase and zyrtec in combination.   Anxiety: In March she had a panic attack, was struggling with anxiety.  She was seen in the ER and given Lorazepam which did help, but she did not take many of them because she prayed and her anxiety resolved on its own.  Does not want to take anything for this at this time.  GERD: Triggers are ice cream, pizza, acidic foods. Has struggled with this her whole life. She does endorse abdominal/epigastric discomfort about every other day.   No difficulty swallowing.  No blood in stool; has occasional nausea, very occasional vomiting. She'd like to try Pepcid.  Patient Active Problem List   Diagnosis Date Noted  . BMI 40.0-44.9, adult ([facility]) 03/18/2016  . GERD (gastroesophageal reflux  disease) 01/20/2014    Past Surgical History:  Procedure Laterality Date  . NO PAST SURGERIES      Family History  Problem Relation Age of Onset  . Hypertension Father   . Diabetes Paternal Aunt   . Cancer Maternal Grandmother   . Diabetes Maternal Grandmother   . Dementia Maternal Grandmother   . Diabetes Paternal Grandmother   . Hypertension Paternal Grandmother   . Asthma Sister   . Cancer Paternal Grandfather   . Crohn's disease Sister   . Mental retardation Neg Hx     Social History   Socioeconomic History  . Marital status: Single    Spouse name: Not on file  . Number of children: 3  . Years of education: 24  . Highest education level: Associate degree: occupational, Hotel manager, or vocational program  Occupational History  . Not on file  Social Needs  . Financial resource strain: Not hard at all  . Food insecurity    Worry: Never true    Inability: Never true  . Transportation needs    Medical: No    Non-medical: No  Tobacco Use  . Smoking status: Never Smoker  . Smokeless tobacco: Never Used  Substance and Sexual Activity  . Alcohol use: No  . Drug use: No  . Sexual activity: Yes    Birth control/protection: Implant  Lifestyle  . Physical activity    Days per week: 1 day    Minutes per session:  20 min  . Stress: Not at all  Relationships  . Social Herbalist on phone: Once a week    Gets together: Once a week    Attends religious service: Never    Active member of club or organization: No    Attends meetings of clubs or organizations: Never    Relationship status: Not on file  . Intimate partner violence    Fear of current or ex partner: No    Emotionally abused: No    Physically abused: No    Forced sexual activity: No  Other Topics Concern  . Not on file  Social History Narrative  . Not on file     Current Outpatient Medications:  .  cetirizine (ZYRTEC) 5 MG tablet, Take 5 mg by mouth daily., Disp: , Rfl:  .  Etonogestrel  (NEXPLANON [place]), Inject into the skin., Disp: , Rfl:  .  LORazepam (ATIVAN) 0.5 MG tablet, Take 1 tablet (0.5 mg total) by mouth every 8 (eight) hours as needed for anxiety. (Patient not taking: Reported on 11/15/2018), Disp: 10 tablet, Rfl: 0  Allergies  Allergen Reactions  . Compazine [Prochlorperazine Edisylate] Anxiety    I personally reviewed active problem list, medication list, allergies, family history, social history, health maintenance, notes from last encounter, lab results with the patient/caregiver today.   ROS  Constitutional: Negative for fever or weight change.  Respiratory: Negative for cough and shortness of breath.   Cardiovascular: Negative for chest pain or palpitations.  Gastrointestinal: Negative for abdominal pain, no bowel changes.  Musculoskeletal: Negative for gait problem or joint swelling.  Skin: Negative for rash.  Neurological: Negative for dizziness or headache.  No other specific complaints in a complete review of systems (except as listed in HPI above).  Objective  Virtual encounter, vitals not obtained.  There is no height or weight on file to calculate BMI.  Physical Exam Constitutional: Patient appears well-developed and well-nourished. No distress.  HENT: Head: Normocephalic and atraumatic. There is no swelling or conjunctivitis present today. Neck: Normal range of motion. Pulmonary/Chest: Effort normal. No respiratory distress. Speaking in complete sentences Neurological: Pt is alert and oriented to person, place, and time. Coordination, speech and gait are normal.  Psychiatric: Patient has a normal mood and affect. behavior is normal. Judgment and thought content normal.  No results found for this or any previous visit (from the past 72 hour(s)).  PHQ2/9: Depression screen [facility] 2/9 11/15/2018  Decreased Interest 0  Down, Depressed, Hopeless 0  PHQ - 2 Score 0  Altered sleeping 0  Tired, decreased energy 0  Change in appetite 0  Feeling  bad or failure about yourself  0  Trouble concentrating 0  Moving slowly or fidgety/restless 0  Suicidal thoughts 0  PHQ-9 Score 0  Difficult doing work/chores Not difficult at all   PHQ-2/9 Result is negative.    Fall Risk: Fall Risk  11/15/2018 05/19/2017 05/12/2017 04/27/2017 02/07/2017  Falls in the past year? 1 No No No No  Number falls in past yr: 0 - - - -  Injury with Fall? 0 - - - -    Assessment & Plan  1. Seasonal allergic rhinitis, unspecified trigger - We will add pataday today to help with ocular symptoms - Olopatadine HCl (PATADAY) 0.2 % SOLN; Apply 1 drop to eye daily.  Dispense: 2.5 mL; Refill: 2 - cetirizine (ZYRTEC) 10 MG tablet; Take 1 tablet (10 mg total) by mouth daily.  Dispense: 30 tablet; Refill:  11 - fluticasone (FLONASE) 50 MCG/ACT nasal spray; Place 2 sprays into both nostrils daily.  Dispense: 16 g; Refill: 6  2. Wheezing - Albuterol PRN as she does have occasional wheezing - no history of asthma, UTA spirometry due to COVID-19 pandemic restrictions on nebulizers. - cetirizine (ZYRTEC) 10 MG tablet; Take 1 tablet (10 mg total) by mouth daily.  Dispense: 30 tablet; Refill: 11  3. Allergic conjunctivitis of both eyes - Olopatadine HCl (PATADAY) 0.2 % SOLN; Apply 1 drop to eye daily.  Dispense: 2.5 mL; Refill: 2 - cetirizine (ZYRTEC) 10 MG tablet; Take 1 tablet (10 mg total) by mouth daily.  Dispense: 30 tablet; Refill: 11 - fluticasone (FLONASE) 50 MCG/ACT nasal spray; Place 2 sprays into both nostrils daily.  Dispense: 16 g; Refill: 6  4. Gastroesophageal reflux disease without esophagitis - Avoid triggers, pepcid daily for 4-8 weeks, then PRN if doing well.   I discussed the assessment and treatment plan with the patient. The patient was provided an opportunity to ask questions and all were answered. The patient agreed with the plan and demonstrated an understanding of the instructions.  The patient was advised to call back or seek an in-person  evaluation if the symptoms worsen or if the condition fails to improve as anticipated.  I provided 22 minutes of non-face-to-face time during this encounter.

## 2018-11-22 ENCOUNTER — Telehealth: Payer: Self-pay | Admitting: Family Medicine

## 2018-11-22 DIAGNOSIS — H1013 Acute atopic conjunctivitis, bilateral: Secondary | ICD-10-CM

## 2018-11-22 DIAGNOSIS — J302 Other seasonal allergic rhinitis: Secondary | ICD-10-CM

## 2018-11-22 MED ORDER — OLOPATADINE HCL 0.2 % OP SOLN: 1.0000 [drp] | Freq: Every day | OPHTHALMIC | 2 refills | Status: DC

## 2018-11-22 NOTE — Telephone Encounter (Signed)
Pt was able to pick up all of her meds from the pharmacy except for the Olopatadine HCl (PATADAY) 0.2 % SOLN  The pharmacy advised they did not has this Rx sent in/ Please resend   Pt also stated tat her eyes are really swollen today and that she did a telemed visit with another provider before and was prescribed prednisone for it and wants to know if Raelyn Ensign would call that in for her as well/ please advise

## 2018-12-12 ENCOUNTER — Encounter: Payer: Medicaid Other | Admitting: Family Medicine

## 2018-12-31 ENCOUNTER — Other Ambulatory Visit: Payer: Self-pay

## 2018-12-31 DIAGNOSIS — Z20822 Contact with and (suspected) exposure to covid-19: Secondary | ICD-10-CM

## 2019-01-01 LAB — NOVEL CORONAVIRUS, NAA: SARS-CoV-2, NAA: NOT DETECTED

## 2019-01-02 ENCOUNTER — Encounter: Payer: Self-pay | Admitting: Family Medicine

## 2019-01-07 ENCOUNTER — Encounter: Payer: Self-pay | Admitting: Family Medicine

## 2019-01-14 ENCOUNTER — Encounter: Payer: Medicaid Other | Admitting: Family Medicine

## 2019-01-14 DIAGNOSIS — D509 Iron deficiency anemia, unspecified: Secondary | ICD-10-CM | POA: Insufficient documentation

## 2019-01-14 DIAGNOSIS — J302 Other seasonal allergic rhinitis: Secondary | ICD-10-CM | POA: Insufficient documentation

## 2019-01-14 NOTE — Progress Notes (Signed)
Patient: Sherry Spence, Female    DOB: 1990/12/29, 28 y.o.   MRN: [id number] Sherry Hartshorn, FNP Visit Date: 01/15/2019  Today's Provider: Delsa Grana, PA-C   Chief Complaint  Patient presents with  . Annual Exam   Subjective:   Annual physical exam:  Sherry Spence is a 28 y.o. female who presents today for health maintenance and annual & complete physical exam.   Exercise/Activity:  Biking with family once a week, wants to start walking at [place] with friends Diet/nutrition:   Eats breakfast bacon eggs and fruit, lunch skips sometimes, left overs/dinners she cooks chicken steak vegetables potatoes, dessert almost every night in bed  She reports she is sleeping - okay, she is up at least once a night checking on children ages 39, 2, 4   Pt wished to discuss  acute complaints- particularly weight, allergies/eye sx, periods  USPSTF grade A and B recommendations - reviewed and addressed today  Depression:  Phq 9 completed today by patient, was reviewed by me with patient in the room, score is  negative, pt feels good, is happy with family and fiance - finally getting married after planning for several years PHQ 2/9 Scores 01/15/2019 11/15/2018  PHQ - 2 Score 0 0  PHQ- 9 Score 0 0   Depression screen [facility] 2/9 01/15/2019 11/15/2018  Decreased Interest 0 0  Down, Depressed, Hopeless 0 0  PHQ - 2 Score 0 0  Altered sleeping 0 0  Tired, decreased energy 0 0  Change in appetite 0 0  Feeling bad or failure about yourself  0 0  Trouble concentrating 0 0  Moving slowly or fidgety/restless 0 0  Suicidal thoughts 0 0  PHQ-9 Score 0 0  Difficult doing work/chores Not difficult at all Not difficult at all    Hep C Screening: not indicated STD testing and prevention (HIV/chl/gon/syphilis): recently done with pregnancy 2019 Intimate partner violence: denies, feels safe Sexual History/Pain during Intercourse:  Sexually active with one female partner for several years -  her fiance, no concerns  Menstrual History/LMP/Abnormal Bleeding: Nexplanon - some irregular spotting after she got it, now very heavy and long cycles lasting 7-8 days but several times has had period lasting 2-2.5 weeks Started period 4 d ago LMP 01/11/2019 Patient's last menstrual period was 01/11/2019 (exact date).   Incontinence Symptoms: none  Breast cancer:  Not indicated due to pt age, no family hx Family hx of cancers - breast, ovarian, uterine, colon - pt denies Cervical cancer:  UTD on PAP  Osteoporosis:   Screening N/A  Colorectal cancer:  No GI sx or change to BMs N/A due to age   Social History   Tobacco Use  . Smoking status: Never Smoker  . Smokeless tobacco: Never Used  Substance Use Topics  . Alcohol use: No     Alcohol screening:   Office Visit from [facility]/15/2019 in [facility]  AUDIT-C Score  0      Blood pressure/Hypertension: BP Readings from Last 3 Encounters:  01/15/19 120/80  08/21/18 (!) 149/45  02/21/18 139/88   Weight/Obesity: Wt Readings from Last 3 Encounters:  01/15/19 269 lb 11.2 oz (122.3 kg)  08/21/18 280 lb (127 kg)  02/21/18 263 lb 6.4 oz (119.5 kg)   BMI Readings from Last 3 Encounters:  01/15/19 42.24 kg/m  08/21/18 45.19 kg/m  02/21/18 41.25 kg/m    Lipids:  No results found for: CHOL No results found for: HDL No results  found for: [place] No results found for: TRIG No results found for: CHOLHDL No results found for: LDLDIRECT  Glucose:  Glucose  Date Value Ref Range Status  04/12/2017 80 65 - 99 mg/dL Final  01/16/2017 82 65 - 99 mg/dL Final  11/28/2016 96 65 - 99 mg/dL Final   Glucose, Bld  Date Value Ref Range Status  08/21/2018 92 70 - 99 mg/dL Final  01/07/2016 112 (H) 65 - 99 mg/dL Final  12/28/2015 100 (H) 65 - 99 mg/dL Final    Social History      She  reports that she has never smoked. She has never used smokeless tobacco. She reports that she does not drink alcohol or use  drugs.            Social History   Socioeconomic History  . Marital status: Significant Other    Spouse name: Not on file  . Number of children: 3  . Years of education: 21  . Highest education level: Associate degree: occupational, Hotel manager, or vocational program  Occupational History  . Not on file  Social Needs  . Financial resource strain: Not hard at all  . Food insecurity    Worry: Never true    Inability: Never true  . Transportation needs    Medical: No    Non-medical: No  Tobacco Use  . Smoking status: Never Smoker  . Smokeless tobacco: Never Used  Substance and Sexual Activity  . Alcohol use: No  . Drug use: No  . Sexual activity: Yes    Birth control/protection: Implant  Lifestyle  . Physical activity    Days per week: 1 day    Minutes per session: 20 min  . Stress: Not at all  Relationships  . Social Herbalist on phone: Once a week    Gets together: Once a week    Attends religious service: Never    Active member of club or organization: No    Attends meetings of clubs or organizations: Never    Relationship status: Not on file  . Intimate partner violence    Fear of current or ex partner: No    Emotionally abused: No    Physically abused: No    Forced sexual activity: No  Other Topics Concern  . Not on file  Social History Narrative   Getting Married Sept 20th (? Or close to that) 2020 with fiance of several years   Three children ages 55, 66 and [place]    Family History           Family History  Problem Relation Age of Onset  . Hypertension Father   . Diabetes Paternal Aunt   . Cancer Maternal Grandmother   . Diabetes Maternal Grandmother   . Dementia Maternal Grandmother   . Diabetes Paternal Grandmother   . Hypertension Paternal Grandmother   . Asthma Mother   . Asthma Sister   . Cancer Paternal Grandfather   . Crohn's disease Sister   . Asthma Sister   . Mental  retardation Neg Hx     Patient Active Problem List   Diagnosis Date Noted  . Anemia, unspecified 01/14/2019  . Seasonal allergies 01/14/2019  . BMI 40.0-44.9, adult ([place]) 03/18/2016  . GERD (gastroesophageal reflux disease) 01/20/2014    Past Surgical History:  Procedure Laterality Date  . NO PAST SURGERIES  Current Outpatient Medications:  .  albuterol (VENTOLIN HFA) 108 (90 Base) MCG/ACT inhaler, Inhale 2 puffs into the lungs every 6 (six) hours as needed for wheezing or shortness of breath., Disp: 18 g, Rfl: 1 .  Etonogestrel (NEXPLANON Jaconita), Inject into the skin., Disp: , Rfl:  .  famotidine (PEPCID) 20 MG tablet, Take 1 tablet (20 mg total) by mouth daily., Disp: 90 tablet, Rfl: 0 .  fluticasone (FLONASE) 50 MCG/ACT nasal spray, Place 2 sprays into both nostrils daily., Disp: 16 g, Rfl: 6 .  Olopatadine HCl (PATADAY) 0.2 % SOLN, Apply 1 drop to eye daily., Disp: 2.5 mL, Rfl: 2 .  cromolyn (OPTICROM) 4 % ophthalmic solution, Place 1 drop into both eyes 4 (four) times daily as needed (for itchy, watery or swollen eye sx)., Disp: 10 mL, Rfl: 2 .  levocetirizine (XYZAL) 5 MG tablet, Take 1 tablet (5 mg total) by mouth every evening., Disp: 30 tablet, Rfl: 11 .  montelukast (SINGULAIR) 10 MG tablet, Take 1 tablet (10 mg total) by mouth at bedtime., Disp: 30 tablet, Rfl: 5 .  phentermine (ADIPEX-P) 37.5 MG tablet, Take 1 tablet (37.5 mg total) by mouth daily before breakfast., Disp: 30 tablet, Rfl: 0  Allergies  Allergen Reactions  . Compazine [Prochlorperazine Edisylate] Anxiety    Patient Care Team: Sherry Hartshorn, FNP as PCP - General (Family Medicine)  I personally reviewed active problem list, medication list, allergies, family history, social history, health maintenance, lab results with the patient/caregiver today.  Review of Systems  Constitutional: Negative.  Negative for activity change, appetite change, fatigue and unexpected weight change.  HENT: Negative.    Eyes: Negative.   Respiratory: Negative.  Negative for shortness of breath.   Cardiovascular: Negative.  Negative for chest pain, palpitations and leg swelling.  Gastrointestinal: Negative.  Negative for abdominal pain, blood in stool, constipation and diarrhea.  Endocrine: Negative.  Negative for cold intolerance, heat intolerance, polydipsia, polyphagia and polyuria.  Genitourinary: Negative.   Musculoskeletal: Negative.  Negative for arthralgias, gait problem, joint swelling and myalgias.  Skin: Negative.  Negative for color change, pallor and rash.  Allergic/Immunologic: Negative.   Neurological: Negative.  Negative for dizziness, syncope and weakness.  Hematological: Negative.  Negative for adenopathy.  Psychiatric/Behavioral: Negative.  Negative for confusion, dysphoric mood, self-injury and suicidal ideas. The patient is not nervous/anxious.   All other systems reviewed and are negative.         Objective:   Vitals:  Vitals:   01/15/19 0927  BP: 120/80  Pulse: 81  Resp: 16  Temp: 97.8 F (36.6 C)  TempSrc: Oral  SpO2: 98%  Weight: 269 lb 11.2 oz (122.3 kg)  Height: 5\' 7"  (1.702 m)    Body mass index is 42.24 kg/m.  Physical Exam Vitals signs and nursing note reviewed.  Constitutional:      General: She is not in acute distress.    Appearance: Normal appearance. She is well-developed. She is obese. She is not ill-appearing, toxic-appearing or diaphoretic.  HENT:     Head: Normocephalic and atraumatic.     Right Ear: External ear normal.     Left Ear: External ear normal.     Nose: Congestion present.     Mouth/Throat:     Mouth: Mucous membranes are moist.     Pharynx: Oropharynx is clear. Uvula midline. No oropharyngeal exudate or posterior oropharyngeal erythema.  Eyes:     General: Lids are normal. No scleral icterus.  Right eye: No discharge.        Left eye: No discharge.     Conjunctiva/sclera: Conjunctivae normal.     Pupils: Pupils are equal,  round, and reactive to light.     Comments: No palprebal conjunctival pallor No conjunctivitis  Neck:     Musculoskeletal: Normal range of motion and neck supple.     Trachea: Phonation normal. No tracheal deviation.  Cardiovascular:     Rate and Rhythm: Normal rate and regular rhythm.     Pulses: Normal pulses.          Radial pulses are 2+ on the right side and 2+ on the left side.       Posterior tibial pulses are 2+ on the right side and 2+ on the left side.     Heart sounds: Normal heart sounds. No murmur. No friction rub. No gallop.   Pulmonary:     Effort: Pulmonary effort is normal. No respiratory distress.     Breath sounds: Normal breath sounds. No stridor. No wheezing, rhonchi or rales.  Chest:     Chest wall: No tenderness.  Abdominal:     General: Bowel sounds are normal. There is no distension.     Palpations: Abdomen is soft.     Tenderness: There is no abdominal tenderness. There is no guarding or rebound.  Musculoskeletal: Normal range of motion.        General: No deformity.     Right lower leg: No edema.     Left lower leg: No edema.  Lymphadenopathy:     Cervical: No cervical adenopathy.  Skin:    General: Skin is warm and dry.     Capillary Refill: Capillary refill takes less than 2 seconds.     Coloration: Skin is not jaundiced or pale.     Findings: No bruising or rash.  Neurological:     Mental Status: She is alert and oriented to person, place, and time.     Motor: No weakness or abnormal muscle tone.     Coordination: Coordination normal.     Gait: Gait normal.  Psychiatric:        Mood and Affect: Mood normal.        Speech: Speech normal.        Behavior: Behavior normal.        Thought Content: Thought content normal.        Judgment: Judgment normal.      Fall Risk: Fall Risk  01/15/2019 11/15/2018 05/19/2017 05/12/2017 04/27/2017  Falls in the past year? 0 1 No No No  Number falls in past yr: 0 0 - - -  Injury with Fall? 0 0 - - -   Follow up Falls evaluation completed - - - -     Assessment & Plan:    CPE completed today  . USPSTF grade A and B recommendations reviewed with patient; age-appropriate recommendations, preventive care, screening tests, etc discussed and encouraged; healthy living encouraged; see AVS for patient education given to patient  . Discussed importance of 150 minutes of physical activity weekly, AHA exercise recommendations given to pt in AVS/handout  . Discussed importance of healthy diet:  eating lean meats and proteins, avoiding trans fats and saturated fats, avoid simple sugars and excessive carbs in diet, eat 6 servings of fruit/vegetables daily and drink plenty of water and avoid sweet beverages.    . Recommended pt to do annual eye exam and routine dental exams/cleanings  . Depression,  alcohol, fall screening completed as documented above and per flowsheets, and negative for all, had some anxiety in the past, managing with coping skills, sx infrequent  . Reviewed Health Maintenance: Health Maintenance  Topic Date Due  . INFLUENZA VACCINE  02/15/2019 (Originally 12/22/2018)  . PAP SMEAR-Modifier  11/29/2019  . TETANUS/TDAP  03/18/2026  . HIV Screening  Completed    . Immunizations: Immunization History  Administered Date(s) Administered  . Influenza-Unspecified 04/05/2016  . PPD Test 12/22/2013, 07/22/2015  . Tdap 03/18/2016    Problem List Items Addressed This Visit      Digestive   GERD (gastroesophageal reflux disease)    Well controlled with PRN pepcid        Other   BMI 40.0-44.9, adult ([place])    Counseled extensively today on diet, exercise and calorie reduction. Discussed low calorie and high nutrient meals and snacks, cutting out sweets and increasing exercise to at least 3x a week to start Trial of phentermine - benefits, risks and SE discussed at length today, one month f/up appt Referral for MNT Screen labs for metabolic dysfunction and associated dx -  checking thyroid, cholesterol, A1C      Relevant Medications   phentermine (ADIPEX-P) 37.5 MG tablet   Other Relevant Orders   TSH   Hemoglobin A1c   Lipid panel   Amb ref to Medical Nutrition Therapy-MNT   Anemia, unspecified    Hx of anemia - labs over the past 3-4 years show Hbg 9-11, only one reading was normal, most labs are with her pregnancies - so will recheck today, and will add on if still anemic to further eval cause She is having heavy and long menses - so chronic blood loss iron deficiency a likely cause HR normal, no sx of anemia, denies fatigue, palpitations, CP, SOB, pica, cold intolerance Son is having low iron told that it could possibly be genetic - pt has no family hx of anemia, thalassemia, sickle cell disease/trait, or coagulopathy No other bleeding (brushing teeth) no melena, hematochezia, spontaneous bruising      Relevant Orders   CBC with Differential/Platelet   Seasonal allergies    Has improved from her last couple visits Using OTC pataday Will Rx meds that are covered by insurance for seasonal allergies, AR, allergic conjunctivitis      Relevant Medications   levocetirizine (XYZAL) 5 MG tablet   Other Relevant Orders   CBC with Differential/Platelet    Other Visit Diagnoses    General medical exam    -  Primary   Relevant Orders   TSH   Hemoglobin A1c   Lipid panel   COMPLETE METABOLIC PANEL WITH GFR   CBC with Differential/Platelet   Screening for lipoid disorders       Relevant Orders   Lipid panel   Screening for endocrine, metabolic and immunity disorder       Relevant Orders   TSH   Hemoglobin A1c   Lipid panel   COMPLETE METABOLIC PANEL WITH GFR   CBC with Differential/Platelet   Reactive airway disease without complication, unspecified asthma severity, unspecified whether persistent       wheeze in the past with allergies and illness, SABA PRN use few x a year, con't allergy meds, add singulair if worsening, continue SABA PRN    Relevant Medications   montelukast (SINGULAIR) 10 MG tablet   Seasonal allergic rhinitis, unspecified trigger       Relevant Medications   cromolyn (OPTICROM) 4 % ophthalmic solution  levocetirizine (XYZAL) 5 MG tablet   fluticasone (FLONASE) 50 MCG/ACT nasal spray   montelukast (SINGULAIR) 10 MG tablet   Allergic conjunctivitis of both eyes       Relevant Medications   cromolyn (OPTICROM) 4 % ophthalmic solution   levocetirizine (XYZAL) 5 MG tablet   fluticasone (FLONASE) 50 MCG/ACT nasal spray   montelukast (SINGULAIR) 10 MG tablet   Menorrhagia with irregular cycle       heavy long menses with nexplanon, 7-14+ d cycles, follow up OBGYN regarding cycle and nexplanon, discussed at length birth control options      One month f/up for weight loss/med monitoring with phentermine  Return in about 1 year (around 01/15/2020) for with PCP for annual CPE.   Delsa Grana, PA-C 01/15/19 10:39 AM  [facility]

## 2019-01-15 ENCOUNTER — Other Ambulatory Visit: Payer: Self-pay

## 2019-01-15 ENCOUNTER — Ambulatory Visit: Payer: Medicaid Other | Admitting: Family Medicine

## 2019-01-15 ENCOUNTER — Encounter: Payer: Self-pay | Admitting: Family Medicine

## 2019-01-15 VITALS — BP 120/80 | HR 81 | Temp 97.8°F | Resp 16 | Ht 67.0 in | Wt 269.7 lb

## 2019-01-15 DIAGNOSIS — H1013 Acute atopic conjunctivitis, bilateral: Secondary | ICD-10-CM

## 2019-01-15 DIAGNOSIS — J302 Other seasonal allergic rhinitis: Secondary | ICD-10-CM | POA: Diagnosis not present

## 2019-01-15 DIAGNOSIS — K219 Gastro-esophageal reflux disease without esophagitis: Secondary | ICD-10-CM

## 2019-01-15 DIAGNOSIS — Z1329 Encounter for screening for other suspected endocrine disorder: Secondary | ICD-10-CM

## 2019-01-15 DIAGNOSIS — Z0000 Encounter for general adult medical examination without abnormal findings: Secondary | ICD-10-CM | POA: Diagnosis not present

## 2019-01-15 DIAGNOSIS — Z13228 Encounter for screening for other metabolic disorders: Secondary | ICD-10-CM

## 2019-01-15 DIAGNOSIS — D649 Anemia, unspecified: Secondary | ICD-10-CM | POA: Diagnosis not present

## 2019-01-15 DIAGNOSIS — Z13220 Encounter for screening for lipoid disorders: Secondary | ICD-10-CM

## 2019-01-15 DIAGNOSIS — J45909 Unspecified asthma, uncomplicated: Secondary | ICD-10-CM

## 2019-01-15 DIAGNOSIS — N921 Excessive and frequent menstruation with irregular cycle: Secondary | ICD-10-CM

## 2019-01-15 DIAGNOSIS — Z130 Encounter for screening for diseases of the blood and blood-forming organs and certain disorders involving the immune mechanism: Secondary | ICD-10-CM

## 2019-01-15 DIAGNOSIS — Z6841 Body Mass Index (BMI) 40.0 and over, adult: Secondary | ICD-10-CM

## 2019-01-15 MED ORDER — FLUTICASONE PROPIONATE 50 MCG/ACT NA SUSP: 2.0000 | Freq: Every day | NASAL | 6 refills | Status: DC

## 2019-01-15 MED ORDER — MONTELUKAST SODIUM 10 MG PO TABS: 10.0000 mg | ORAL_TABLET | Freq: Every day | ORAL | 5 refills | Status: DC

## 2019-01-15 MED ORDER — CROMOLYN SODIUM 4 % OP SOLN: 1.0000 [drp] | Freq: Four times a day (QID) | OPHTHALMIC | 2 refills | Status: DC | PRN

## 2019-01-15 MED ORDER — LEVOCETIRIZINE DIHYDROCHLORIDE 5 MG PO TABS: 5.0000 mg | ORAL_TABLET | Freq: Every evening | ORAL | 11 refills | Status: DC

## 2019-01-15 MED ORDER — PHENTERMINE HCL 37.5 MG PO TABS: 37.5000 mg | ORAL_TABLET | Freq: Every day | ORAL | 0 refills | Status: DC

## 2019-01-15 NOTE — Assessment & Plan Note (Addendum)
Hx of anemia - labs over the past 3-4 years show Hbg 9-11, only one reading was normal, most labs are with her pregnancies - so will recheck today, and will add on if still anemic to further eval cause She is having heavy and long menses - so chronic blood loss iron deficiency a likely cause HR normal, no sx of anemia, denies fatigue, palpitations, CP, SOB, pica, cold intolerance Son is having low iron told that it could possibly be genetic - pt has no family hx of anemia, thalassemia, sickle cell disease/trait, or coagulopathy No other bleeding (brushing teeth) no melena, hematochezia, spontaneous bruising

## 2019-01-15 NOTE — Assessment & Plan Note (Signed)
Has improved from her last couple visits Using OTC pataday Will Rx meds that are covered by insurance for seasonal allergies, AR, allergic conjunctivitis

## 2019-01-15 NOTE — Patient Instructions (Addendum)
Return in October for flu clinic (call office to arrange - should be quick drive through flu shot)  There was some anemia in the labs in the past - will recheck that today Will also screen you cholesterol panel, screen for diabetes and thyroid dysfunction, as well as checking your kidney function, liver function and electrolytes.  For your age and since you are low risk, these screenings may be done once a year to once every 3-5 years.   *PHENTERMINE*  Get from the pharmacy using the [email or web address] website and coupon Start with 1/2 a tab in the morning before breakfast for about a week, then if you tolerate it okay, you can increase to a full tablet each morning We have to do monthly follow ups with this medicine because it is a controlled subtance and stimulant If you do not work on your diet, exercise and changing your lifestyle, then any weight loss you have with medicine, you will usually gain back when you are done (3 month limit on the medicine).  Count calories, cut out sweets and carbs, drink  A LOT of water, and increase your exercising to 3x a week for at least 30 minutes each.   Calorie Counting for Weight Loss Calories are units of energy. Your body needs a certain amount of calories from food to keep you going throughout the day. When you eat more calories than your body needs, your body stores the extra calories as fat. When you eat fewer calories than your body needs, your body burns fat to get the energy it needs. Calorie counting means keeping track of how many calories you eat and drink each day. Calorie counting can be helpful if you need to lose weight. If you make sure to eat fewer calories than your body needs, you should lose weight. Ask your health care provider what a healthy weight is for you. For calorie counting to work, you will need to eat the right number of calories in a day in order to lose a healthy amount of weight per week. A dietitian can help you determine how  many calories you need in a day and will give you suggestions on how to reach your calorie goal.  A healthy amount of weight to lose per week is usually 1-2 lb (0.5-0.9 kg). This usually means that your daily calorie intake should be reduced by 500-750 calories.  Eating 1,200 - 1,500 calories per day can help most women lose weight.  Eating 1,500 - 1,800 calories per day can help most men lose weight. What is my plan? My goal is to have 1500 calories per day.   What do I need to know about calorie counting? In order to meet your daily calorie goal, you will need to:  Find out how many calories are in each food you would like to eat. Try to do this before you eat.  Decide how much of the food you plan to eat.  Write down what you ate and how many calories it had. Doing this is called keeping a food log. To successfully lose weight, it is important to balance calorie counting with a healthy lifestyle that includes regular activity. Aim for 150 minutes of moderate exercise (such as walking) or 75 minutes of vigorous exercise (such as running) each week. Where do I find calorie information?  The number of calories in a food can be found on a Nutrition Facts label. If a food does not have a Nutrition Facts label, try  to look up the calories online or ask your dietitian for help. Remember that calories are listed per serving. If you choose to have more than one serving of a food, you will have to multiply the calories per serving by the amount of servings you plan to eat. For example, the label on a package of bread might say that a serving size is 1 slice and that there are 90 calories in a serving. If you eat 1 slice, you will have eaten 90 calories. If you eat 2 slices, you will have eaten 180 calories. How do I keep a food log? Immediately after each meal, record the following information in your food log:  What you ate. Don't forget to include toppings, sauces, and other extras on the food.   How much you ate. This can be measured in cups, ounces, or number of items.  How many calories each food and drink had.  The total number of calories in the meal. Keep your food log near you, such as in a small notebook in your pocket, or use a mobile app or website. Some programs will calculate calories for you and show you how many calories you have left for the day to meet your goal. What are some calorie counting tips?   Use your calories on foods and drinks that will fill you up and not leave you hungry: ? Some examples of foods that fill you up are nuts and nut butters, vegetables, lean proteins, and high-fiber foods like whole grains. High-fiber foods are foods with more than 5 g fiber per serving. ? Drinks such as sodas, specialty coffee drinks, alcohol, and juices have a lot of calories, yet do not fill you up.  Eat nutritious foods and avoid empty calories. Empty calories are calories you get from foods or beverages that do not have many vitamins or protein, such as candy, sweets, and soda. It is better to have a nutritious high-calorie food (such as an avocado) than a food with few nutrients (such as a bag of chips).  Know how many calories are in the foods you eat most often. This will help you calculate calorie counts faster.  Pay attention to calories in drinks. Low-calorie drinks include water and unsweetened drinks.  Pay attention to nutrition labels for "low fat" or "fat free" foods. These foods sometimes have the same amount of calories or more calories than the full fat versions. They also often have added sugar, starch, or salt, to make up for flavor that was removed with the fat.  Find a way of tracking calories that works for you. Get creative. Try different apps or programs if writing down calories does not work for you. What are some portion control tips?  Know how many calories are in a serving. This will help you know how many servings of a certain food you can  have.  Use a measuring cup to measure serving sizes. You could also try weighing out portions on a kitchen scale. With time, you will be able to estimate serving sizes for some foods.  Take some time to put servings of different foods on your favorite plates, bowls, and cups so you know what a serving looks like.  Try not to eat straight from a bag or box. Doing this can lead to overeating. Put the amount you would like to eat in a cup or on a plate to make sure you are eating the right portion.  Use smaller plates, glasses, and  bowls to prevent overeating.  Try not to multitask (for example, watch TV or use your computer) while eating. If it is time to eat, sit down at a table and enjoy your food. This will help you to know when you are full. It will also help you to be aware of what you are eating and how much you are eating. What are tips for following this plan? Reading food labels  Check the calorie count compared to the serving size. The serving size may be smaller than what you are used to eating.  Check the source of the calories. Make sure the food you are eating is high in vitamins and protein and low in saturated and trans fats. Shopping  Read nutrition labels while you shop. This will help you make healthy decisions before you decide to purchase your food.  Make a grocery list and stick to it. Cooking  Try to cook your favorite foods in a healthier way. For example, try baking instead of frying.  Use low-fat dairy products. Meal planning  Use more fruits and vegetables. Half of your plate should be fruits and vegetables.  Include lean proteins like poultry and fish. How do I count calories when eating out?  Ask for smaller portion sizes.  Consider sharing an entree and sides instead of getting your own entree.  If you get your own entree, eat only half. Ask for a box at the beginning of your meal and put the rest of your entree in it so you are not tempted to eat it.   If calories are listed on the menu, choose the lower calorie options.  Choose dishes that include vegetables, fruits, whole grains, low-fat dairy products, and lean protein.  Choose items that are boiled, broiled, grilled, or steamed. Stay away from items that are buttered, battered, fried, or served with cream sauce. Items labeled "crispy" are usually fried, unless stated otherwise.  Choose water, low-fat milk, unsweetened iced tea, or other drinks without added sugar. If you want an alcoholic beverage, choose a lower calorie option such as a glass of wine or light beer.  Ask for dressings, sauces, and syrups on the side. These are usually high in calories, so you should limit the amount you eat.  If you want a salad, choose a garden salad and ask for grilled meats. Avoid extra toppings like bacon, cheese, or fried items. Ask for the dressing on the side, or ask for olive oil and vinegar or lemon to use as dressing.  Estimate how many servings of a food you are given. For example, a serving of cooked rice is  cup or about the size of half a baseball. Knowing serving sizes will help you be aware of how much food you are eating at restaurants. The list below tells you how big or small some common portion sizes are based on everyday objects: ? 1 oz-4 stacked dice. ? 3 oz-1 deck of cards. ? 1 tsp-1 die. ? 1 Tbsp- a ping-pong ball. ? 2 Tbsp-1 ping-pong ball. ?  cup- baseball. ? 1 cup-1 baseball. Summary  Calorie counting means keeping track of how many calories you eat and drink each day. If you eat fewer calories than your body needs, you should lose weight.  A healthy amount of weight to lose per week is usually 1-2 lb (0.5-0.9 kg). This usually means reducing your daily calorie intake by 500-750 calories.  The number of calories in a food can be found on a Nutrition  Facts label. If a food does not have a Nutrition Facts label, try to look up the calories online or ask your dietitian  for help.  Use your calories on foods and drinks that will fill you up, and not on foods and drinks that will leave you hungry.  Use smaller plates, glasses, and bowls to prevent overeating. This information is not intended to replace advice given to you by your health care provider. Make sure you discuss any questions you have with your health care provider. Document Released: 05/09/2005 Document Revised: 01/26/2018 Document Reviewed: 04/08/2016 Elsevier Patient Education  2020 [employer] 68-48 Years Old, Female Preventive care refers to visits with your health care provider and lifestyle choices that can promote health and wellness. This includes:  A yearly physical exam. This may also be called an annual well check.  Regular dental visits and eye exams.  Immunizations.  Screening for certain conditions.  Healthy lifestyle choices, such as eating a healthy diet, getting regular exercise, not using drugs or products that contain nicotine and tobacco, and limiting alcohol use. What can I expect for my preventive care visit? Physical exam Your health care provider will check your:  Height and weight. This may be used to calculate body mass index (BMI), which tells if you are at a healthy weight.  Heart rate and blood pressure.  Skin for abnormal spots. Counseling Your health care provider may ask you questions about your:  Alcohol, tobacco, and drug use.  Emotional well-being.  Home and relationship well-being.  Sexual activity.  Eating habits.  Work and work Statistician.  Method of birth control.  Menstrual cycle.  Pregnancy history. What immunizations do I need?  Influenza (flu) vaccine  This is recommended every year. Tetanus, diphtheria, and pertussis (Tdap) vaccine  You may need a Td booster every 10 years. Varicella (chickenpox) vaccine  You may need this if you have not been vaccinated. Human papillomavirus (HPV) vaccine  If  recommended by your health care provider, you may need three doses over 6 months. Measles, mumps, and rubella (MMR) vaccine  You may need at least one dose of MMR. You may also need a second dose. Meningococcal conjugate (MenACWY) vaccine  One dose is recommended if you are age 37-21 years and a first-year college student living in a residence hall, or if you have one of several medical conditions. You may also need additional booster doses. Pneumococcal conjugate (PCV13) vaccine  You may need this if you have certain conditions and were not previously vaccinated. Pneumococcal polysaccharide (PPSV23) vaccine  You may need one or two doses if you smoke cigarettes or if you have certain conditions. Hepatitis A vaccine  You may need this if you have certain conditions or if you travel or work in places where you may be exposed to hepatitis A. Hepatitis B vaccine  You may need this if you have certain conditions or if you travel or work in places where you may be exposed to hepatitis B. Haemophilus influenzae type b (Hib) vaccine  You may need this if you have certain conditions. You may receive vaccines as individual doses or as more than one vaccine together in one shot (combination vaccines). Talk with your health care provider about the risks and benefits of combination vaccines. What tests do I need?  Blood tests  Lipid and cholesterol levels. These may be checked every 5 years starting at age 81.  Hepatitis C test.  Hepatitis B test. Screening  Diabetes screening. This is done by checking your blood sugar (glucose) after you have not eaten for a while (fasting).  Sexually transmitted disease (STD) testing.  BRCA-related cancer screening. This may be done if you have a family history of breast, ovarian, tubal, or peritoneal cancers.  Pelvic exam and Pap test. This may be done every 3 years starting at age 36. Starting at age 48, this may be done every 5 years if you have a  Pap test in combination with an HPV test. Talk with your health care provider about your test results, treatment options, and if necessary, the need for more tests. Follow these instructions at home: Eating and drinking   Eat a diet that includes fresh fruits and vegetables, whole grains, lean protein, and low-fat dairy.  Take vitamin and mineral supplements as recommended by your health care provider.  Do not drink alcohol if: ? Your health care provider tells you not to drink. ? You are pregnant, may be pregnant, or are planning to become pregnant.  If you drink alcohol: ? Limit how much you have to 0-1 drink a day. ? Be aware of how much alcohol is in your drink. In the [place] one drink equals one 12 oz bottle of beer (355 mL), one 5 oz glass of wine (148 mL), or one 1 oz glass of hard liquor (44 mL). Lifestyle  Take daily care of your teeth and gums.  Stay active. Exercise for at least 30 minutes on 5 or more days each week.  Do not use any products that contain nicotine or tobacco, such as cigarettes, e-cigarettes, and chewing tobacco. If you need help quitting, ask your health care provider.  If you are sexually active, practice safe sex. Use a condom or other form of birth control (contraception) in order to prevent pregnancy and STIs (sexually transmitted infections). If you plan to become pregnant, see your health care provider for a preconception visit. What's next?  Visit your health care provider once a year for a well check visit.  Ask your health care provider how often you should have your eyes and teeth checked.  Stay up to date on all vaccines. This information is not intended to replace advice given to you by your health care provider. Make sure you discuss any questions you have with your health care provider. Document Released: 07/05/2001 Document Revised: 01/18/2018 Document Reviewed: 01/18/2018 Elsevier Patient Education  [facility]) Exercise Recommendation  Being physically active is important to prevent heart disease and stroke, the nation's No. 1and No. 5killers. To improve overall cardiovascular health, we suggest at least 150 minutes per week of moderate exercise or 75 minutes per week of vigorous exercise (or a combination of moderate and vigorous activity). Thirty minutes a day, five times a week is an easy goal to remember. You will also experience benefits even if you divide your time into two or three segments of 10 to 15 minutes per day.  For people who would benefit from lowering their blood pressure or cholesterol, we recommend 40 minutes of aerobic exercise of moderate to vigorous intensity three to four times a week to lower the risk for heart attack and stroke.  Physical activity is anything that makes you move your body and burn calories.  This includes things like climbing stairs or playing sports. Aerobic exercises benefit your heart, and include walking, jogging, swimming or biking. Strength and stretching exercises are best for overall stamina and flexibility.  The simplest, positive change you can make to effectively improve your heart health is to start walking. It's enjoyable, free, easy, social and great exercise. A walking program is flexible and boasts high success rates because people can stick with it. It's easy for walking to become a regular and satisfying part of life.   For Overall Cardiovascular Health:  At least 30 minutes of moderate-intensity aerobic activity at least 5 days per week for a total of 150  OR   At least 25 minutes of vigorous aerobic activity at least 3 days per week for a total of 75 minutes; or a combination of moderate- and vigorous-intensity aerobic activity  AND   Moderate- to high-intensity muscle-strengthening activity at least 2 days per week for additional health benefits.  For Lowering Blood Pressure and Cholesterol  An average 40 minutes  of moderate- to vigorous-intensity aerobic activity 3 or 4 times per week  What if I can't make it to the time goal? Something is always better than nothing! And everyone has to start somewhere. Even if you've been sedentary for years, today is the day you can begin to make healthy changes in your life. If you don't think you'll make it for 30 or 40 minutes, set a reachable goal for today. You can work up toward your overall goal by increasing your time as you get stronger. Don't let all-or-nothing thinking rob you of doing what you can every day.  Source:http://www.heart.org

## 2019-01-15 NOTE — Assessment & Plan Note (Signed)
Counseled extensively today on diet, exercise and calorie reduction. Discussed low calorie and high nutrient meals and snacks, cutting out sweets and increasing exercise to at least 3x a week to start Trial of phentermine - benefits, risks and SE discussed at length today, one month f/up appt Referral for MNT Screen labs for metabolic dysfunction and associated dx - checking thyroid, cholesterol, A1C

## 2019-01-15 NOTE — Assessment & Plan Note (Signed)
Well controlled with PRN pepcid

## 2019-01-16 ENCOUNTER — Other Ambulatory Visit: Payer: Self-pay | Admitting: Family Medicine

## 2019-01-16 DIAGNOSIS — D509 Iron deficiency anemia, unspecified: Secondary | ICD-10-CM

## 2019-01-16 DIAGNOSIS — D75839 Thrombocytosis, unspecified: Secondary | ICD-10-CM

## 2019-01-16 DIAGNOSIS — D473 Essential (hemorrhagic) thrombocythemia: Secondary | ICD-10-CM

## 2019-01-16 LAB — CBC WITH DIFFERENTIAL/PLATELET
Absolute Monocytes: 262 cells/uL (ref 200–950)
Basophils Absolute: 22 cells/uL (ref 0–200)
Basophils Relative: 0.5 %
Eosinophils Absolute: 181 cells/uL (ref 15–500)
Eosinophils Relative: 4.2 %
HCT: 34 % — ABNORMAL LOW (ref 35.0–45.0)
Hemoglobin: 10.5 g/dL — ABNORMAL LOW (ref 11.7–15.5)
Lymphs Abs: 1277 cells/uL (ref 850–3900)
MCH: 24.2 pg — ABNORMAL LOW (ref 27.0–33.0)
MCHC: 30.9 g/dL — ABNORMAL LOW (ref 32.0–36.0)
MCV: 78.3 fL — ABNORMAL LOW (ref 80.0–100.0)
MPV: 9.8 fL (ref 7.5–12.5)
Monocytes Relative: 6.1 %
Neutro Abs: 2559 cells/uL (ref 1500–7800)
Neutrophils Relative %: 59.5 %
Platelets: 414 10*3/uL — ABNORMAL HIGH (ref 140–400)
RBC: 4.34 10*6/uL (ref 3.80–5.10)
RDW: 13.9 % (ref 11.0–15.0)
Total Lymphocyte: 29.7 %
WBC: 4.3 10*3/uL (ref 3.8–10.8)

## 2019-01-16 LAB — COMPLETE METABOLIC PANEL WITHOUT GFR
AG Ratio: 1.2 (calc) (ref 1.0–2.5)
ALT: 17 U/L (ref 6–29)
AST: 13 U/L (ref 10–30)
Albumin: 3.7 g/dL (ref 3.6–5.1)
Alkaline phosphatase (APISO): 98 U/L (ref 31–125)
BUN: 9 mg/dL (ref 7–25)
CO2: 25 mmol/L (ref 20–32)
Calcium: 8.4 mg/dL — ABNORMAL LOW (ref 8.6–10.2)
Chloride: 110 mmol/L (ref 98–110)
Creat: 0.6 mg/dL (ref 0.50–1.10)
GFR, Est African American: 142 mL/min/1.73m2 (ref >=60)
GFR, Est Non African American: 122 mL/min/1.73m2 (ref >=60)
Globulin: 3.2 g/dL (ref 1.9–3.7)
Glucose, Bld: 90 mg/dL (ref 65–99)
Potassium: 4 mmol/L (ref 3.5–5.3)
Sodium: 142 mmol/L (ref 135–146)
Total Bilirubin: 0.4 mg/dL (ref 0.2–1.2)
Total Protein: 6.9 g/dL (ref 6.1–8.1)

## 2019-01-16 LAB — LIPID PANEL
Cholesterol: 133 mg/dL (ref <200)
HDL: 37 mg/dL — ABNORMAL LOW (ref >=50)
LDL Cholesterol (Calc): 82 mg/dL
Non-HDL Cholesterol (Calc): 96 mg/dL (ref <130)
Total CHOL/HDL Ratio: 3.6 (calc) (ref <5.0)
Triglycerides: 67 mg/dL (ref <150)

## 2019-01-16 LAB — IRON, TOTAL/TOTAL IRON BINDING CAP
%SAT: 8 % — ABNORMAL LOW (ref 16–45)
Iron: 30 ug/dL — ABNORMAL LOW (ref 40–190)
TIBC: 364 ug/dL (ref 250–450)

## 2019-01-16 LAB — FERRITIN: Ferritin: 9 ng/mL — ABNORMAL LOW (ref 16–154)

## 2019-01-16 LAB — TEST AUTHORIZATION

## 2019-01-16 LAB — HEMOGLOBIN A1C
Hgb A1c MFr Bld: 5.5 %{Hb} (ref <5.7)
Mean Plasma Glucose: 111 (calc)
eAG (mmol/L): 6.2 (calc)

## 2019-01-16 LAB — TSH: TSH: 0.63 m[IU]/L

## 2019-01-16 NOTE — Progress Notes (Signed)
Pt still has microcytic anemia, adding on iron panel And blood smear is able MCV low - dx of anemia updated to reflect microcytosis  Also has mild thrombocytosis  RDW is normal, so possibly not from blood loss?  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

## 2019-01-17 ENCOUNTER — Encounter: Payer: Self-pay | Admitting: Family Medicine

## 2019-01-23 ENCOUNTER — Encounter: Payer: Self-pay | Admitting: Family Medicine

## 2019-02-19 ENCOUNTER — Encounter: Payer: Self-pay | Admitting: Family Medicine

## 2019-02-22 ENCOUNTER — Ambulatory Visit (INDEPENDENT_AMBULATORY_CARE_PROVIDER_SITE_OTHER): Payer: Medicaid Other | Admitting: Family Medicine

## 2019-02-22 ENCOUNTER — Other Ambulatory Visit: Payer: Self-pay

## 2019-02-22 ENCOUNTER — Encounter: Payer: Self-pay | Admitting: Family Medicine

## 2019-02-22 DIAGNOSIS — K047 Periapical abscess without sinus: Secondary | ICD-10-CM

## 2019-02-22 MED ORDER — PENICILLIN V POTASSIUM 500 MG PO TABS: 500.0000 mg | ORAL_TABLET | Freq: Four times a day (QID) | ORAL | 0 refills | Status: AC

## 2019-02-22 MED ORDER — FLUCONAZOLE 150 MG PO TABS: 150.0000 mg | ORAL_TABLET | ORAL | 0 refills | Status: DC | PRN

## 2019-02-22 NOTE — Progress Notes (Signed)
Name: Sherry Spence   MRN: [id number]    DOB: Nov 09, 1990   Date:02/22/2019       Progress Note  Subjective:    Chief Complaint  Chief Complaint  Patient presents with  . Dental Pain    I connected with  [name]  on 02/22/19 at  2:20 PM EDT by a video enabled telemedicine application and verified that I am speaking with the correct person using two identifiers.  I discussed the limitations of evaluation and management by telemedicine and the availability of in person appointments. The patient expressed understanding and agreed to proceed. Staff also discussed with the patient that there may be a patient responsible charge related to this service. Patient Location: in her car Provider Location: [facility] clinic Additional Individuals present: none (her small children in their seats, but no one else present)  Dental Pain  The problem occurs constantly. The problem has been gradually worsening. The pain is at a severity of 10/10. The pain is severe. Associated symptoms include thermal sensitivity. Pertinent negatives include no difficulty swallowing, facial pain, fever, oral bleeding or sinus pressure. She has tried NSAIDs and acetaminophen for the symptoms. The treatment provided no relief.  Patient states it is the Upper right side " the first molar after normal teeth" but when pointing to herself during the virtual encounter she point to the area of tooth # 2 or 3.  She thinks a cavity that fell out she has a whole in her tooth, she has some swelling in the gums around the tooth some pain in to her face and right in front of her ear.  Has been worse for the last 1-2 weeks.  She is trying to get into a dentist.  She has had 2 dentist evaluated in the past one dentist wanted to do an extraction the other dentist told her that he could save the tooth by filling it.  She is trying to get in with a second dentist.   The pain has become more severe, gradually worsening, worse with food  sensitivity and with any palpation or pressure.  She denies trismus, fever, chills, sweats.  No swollen lymph nodes or difficulty swallowing or speaking. She does require Diflucan if taking antibiotics.   Patient Active Problem List   Diagnosis Date Noted  . Microcytic anemia 01/14/2019  . Seasonal allergies 01/14/2019  . BMI 40.0-44.9, adult ([place]) 03/18/2016  . GERD (gastroesophageal reflux disease) 01/20/2014    Social History   Tobacco Use  . Smoking status: Never Smoker  . Smokeless tobacco: Never Used  Substance Use Topics  . Alcohol use: No     Current Outpatient Medications:  .  albuterol (VENTOLIN HFA) 108 (90 Base) MCG/ACT inhaler, Inhale 2 puffs into the lungs every 6 (six) hours as needed for wheezing or shortness of breath., Disp: 18 g, Rfl: 1 .  levocetirizine (XYZAL) 5 MG tablet, Take 1 tablet (5 mg total) by mouth every evening., Disp: 30 tablet, Rfl: 11 .  cromolyn (OPTICROM) 4 % ophthalmic solution, Place 1 drop into both eyes 4 (four) times daily as needed (for itchy, watery or swollen eye sx). (Patient not taking: Reported on 02/22/2019), Disp: 10 mL, Rfl: 2 .  Etonogestrel (NEXPLANON [place]), Inject into the skin., Disp: , Rfl:  .  famotidine (PEPCID) 20 MG tablet, Take 1 tablet (20 mg total) by mouth daily. (Patient not taking: Reported on 02/22/2019), Disp: 90 tablet, Rfl: 0 .  fluticasone (FLONASE) 50 MCG/ACT nasal spray,  Place 2 sprays into both nostrils daily. (Patient not taking: Reported on 02/22/2019), Disp: 16 g, Rfl: 6 .  montelukast (SINGULAIR) 10 MG tablet, Take 1 tablet (10 mg total) by mouth at bedtime. (Patient not taking: Reported on 02/22/2019), Disp: 30 tablet, Rfl: 5 .  Olopatadine HCl (PATADAY) 0.2 % SOLN, Apply 1 drop to eye daily. (Patient not taking: Reported on 02/22/2019), Disp: 2.5 mL, Rfl: 2 .  phentermine (ADIPEX-P) 37.5 MG tablet, Take 1 tablet (37.5 mg total) by mouth daily before breakfast. (Patient not taking: Reported on 02/22/2019), Disp: 30  tablet, Rfl: 0  Allergies  Allergen Reactions  . Compazine [Prochlorperazine Edisylate] Anxiety    I personally reviewed allergies, meds, med history, recently labs with the patient/caregiver today.  Review of Systems  Constitutional: Negative.  Negative for fever.  HENT: Negative.  Negative for sinus pressure.   Eyes: Negative.   Respiratory: Negative.   Cardiovascular: Negative.   Gastrointestinal: Negative.   Endocrine: Negative.   Genitourinary: Negative.   Musculoskeletal: Negative.   Skin: Negative.   Allergic/Immunologic: Negative.   Neurological: Negative.   Hematological: Negative.   Psychiatric/Behavioral: Negative.   All other systems reviewed and are negative.    Objective:   Virtual encounter, vitals limited, only able to obtain the following Today's Vitals   02/22/19 1321  PainSc: 10-Worst pain ever   There is no height or weight on file to calculate BMI. Nursing Note and Vital Signs reviewed.  Physical Exam Vitals signs and nursing note reviewed.  Constitutional:      General: She is not in acute distress.    Appearance: Normal appearance. She is well-developed. She is not ill-appearing, toxic-appearing or diaphoretic.  HENT:     Head: Normocephalic and atraumatic.     Comments: Face is normal-appearing no swelling evident to cheeks or along jawline, no trismus Normal movement of her head and neck Normal phonation    Nose: Nose normal.  Eyes:     General:        Right eye: No discharge.        Left eye: No discharge.     Conjunctiva/sclera: Conjunctivae normal.  Neck:     Musculoskeletal: Normal range of motion.     Trachea: No tracheal deviation.  Cardiovascular:     Rate and Rhythm: Normal rate and regular rhythm.  Pulmonary:     Effort: Pulmonary effort is normal. No respiratory distress.     Breath sounds: No stridor.  Musculoskeletal: Normal range of motion.  Skin:    General: Skin is warm and dry.     Findings: No rash.   Neurological:     Mental Status: She is alert.     Motor: No abnormal muscle tone.     Coordination: Coordination normal.  Psychiatric:        Behavior: Behavior normal.    PE limited by telephone encounter  No results found for this or any previous visit (from the past 72 hour(s)).  Assessment and Plan:     ICD-10-CM   1. Dental infection  K04.7    right upper posterior molar with prior caries and "hole in it" pt f/up with dentist, abx to help tx surrounding infection, to f/up with any worsening  Encouraged patient to follow-up Monday if not having any improvement and she was urged to get definitive treatment with her dentist.  Red flags were reviewed which require ER presentation including trismus, swelling redness going down her throat, pain or difficulty moving her tongue.  -  Red flags and when to present for emergency care or RTC including fever >101.40F, chest pain, shortness of breath, new/worsening/un-resolving symptoms- reviewed with patient at time of visit. Follow up and care instructions discussed and provided in AVS. - I discussed the assessment and treatment plan with the patient. The patient was provided an opportunity to ask questions and all were answered. The patient agreed with the plan and demonstrated an understanding of the instructions.  I provided 10 minutes of non-face-to-face time during this encounter.  Delsa Grana, PA-C 02/22/19 2:42 PM

## 2019-03-07 ENCOUNTER — Encounter: Payer: Self-pay | Admitting: Family Medicine

## 2019-03-20 ENCOUNTER — Ambulatory Visit: Payer: Medicaid Other | Admitting: Dietician

## 2019-03-22 ENCOUNTER — Other Ambulatory Visit: Payer: Self-pay | Admitting: *Deleted

## 2019-03-22 DIAGNOSIS — Z20822 Contact with and (suspected) exposure to covid-19: Secondary | ICD-10-CM

## 2019-03-23 ENCOUNTER — Other Ambulatory Visit: Payer: Self-pay

## 2019-03-23 ENCOUNTER — Encounter: Payer: Self-pay | Admitting: Family Medicine

## 2019-03-23 ENCOUNTER — Emergency Department
Admission: EM | Admit: 2019-03-23 | Discharge: 2019-03-23 | Disposition: A | Discharge status: Home / Self Care | Payer: BLUE CROSS/BLUE SHIELD | Attending: Emergency Medicine | Admitting: Emergency Medicine

## 2019-03-23 ENCOUNTER — Emergency Department: Payer: BLUE CROSS/BLUE SHIELD

## 2019-03-23 ENCOUNTER — Encounter: Payer: Self-pay | Admitting: Emergency Medicine

## 2019-03-23 DIAGNOSIS — Z79899 Other long term (current) drug therapy: Secondary | ICD-10-CM | POA: Insufficient documentation

## 2019-03-23 DIAGNOSIS — R1011 Right upper quadrant pain: Secondary | ICD-10-CM | POA: Diagnosis not present

## 2019-03-23 DIAGNOSIS — R109 Unspecified abdominal pain: Secondary | ICD-10-CM | POA: Diagnosis present

## 2019-03-23 DIAGNOSIS — K8050 Calculus of bile duct without cholangitis or cholecystitis without obstruction: Secondary | ICD-10-CM | POA: Diagnosis not present

## 2019-03-23 LAB — COMPREHENSIVE METABOLIC PANEL
ALT: 22 U/L (ref 0–44)
AST: 33 U/L (ref 15–41)
Albumin: 3.4 g/dL — ABNORMAL LOW (ref 3.5–5.0)
Alkaline Phosphatase: 104 U/L (ref 38–126)
Anion gap: 9 (ref 5–15)
BUN: 14 mg/dL (ref 6–20)
CO2: 23 mmol/L (ref 22–32)
Calcium: 8.6 mg/dL — ABNORMAL LOW (ref 8.9–10.3)
Chloride: 109 mmol/L (ref 98–111)
Creatinine, Ser: 0.7 mg/dL (ref 0.44–1.00)
GFR calc Af Amer: >60 mL/min (ref >60)
GFR calc non Af Amer: >60 mL/min (ref >60)
Glucose, Bld: 118 mg/dL — ABNORMAL HIGH (ref 70–99)
Potassium: 3.8 mmol/L (ref 3.5–5.1)
Sodium: 141 mmol/L (ref 135–145)
Total Bilirubin: 0.3 mg/dL (ref 0.3–1.2)
Total Protein: 7.2 g/dL (ref 6.5–8.1)

## 2019-03-23 LAB — CBC
HCT: 40.7 % (ref 36.0–46.0)
Hemoglobin: 12 g/dL (ref 12.0–15.0)
MCH: 23.6 pg — ABNORMAL LOW (ref 26.0–34.0)
MCHC: 29.5 g/dL — ABNORMAL LOW (ref 30.0–36.0)
MCV: 80 fL (ref 80.0–100.0)
Platelets: 480 10*3/uL — ABNORMAL HIGH (ref 150–400)
RBC: 5.09 MIL/uL (ref 3.87–5.11)
RDW: 14.8 % (ref 11.5–15.5)
WBC: 12.9 10*3/uL — ABNORMAL HIGH (ref 4.0–10.5)
nRBC: 0 % (ref 0.0–0.2)

## 2019-03-23 LAB — URINALYSIS, COMPLETE (UACMP) WITH MICROSCOPIC
Bacteria, UA: NONE SEEN
Bilirubin Urine: NEGATIVE
Glucose, UA: NEGATIVE mg/dL
Hgb urine dipstick: NEGATIVE
Ketones, ur: NEGATIVE mg/dL
Leukocytes,Ua: NEGATIVE
Nitrite: NEGATIVE
Protein, ur: 30 mg/dL — AB
Specific Gravity, Urine: 1.032 — ABNORMAL HIGH (ref 1.005–1.030)
pH: 7 (ref 5.0–8.0)

## 2019-03-23 LAB — LIPASE, BLOOD: Lipase: 47 U/L (ref 11–51)

## 2019-03-23 LAB — NOVEL CORONAVIRUS, NAA: SARS-CoV-2, NAA: NOT DETECTED

## 2019-03-23 MED ORDER — HYDROMORPHONE HCL 1 MG/ML IJ SOLN
1.0000 mg | Freq: Once | INTRAMUSCULAR | Status: AC
  Administered 2019-03-23: 1 mg via INTRAVENOUS
  Filled 2019-03-23: qty 1

## 2019-03-23 MED ORDER — SODIUM CHLORIDE 0.9 % IV BOLUS
1000.0000 mL | Freq: Once | INTRAVENOUS | Status: AC
  Administered 2019-03-23: 1000 mL via INTRAVENOUS

## 2019-03-23 MED ORDER — HYDROCODONE-ACETAMINOPHEN 5-325 MG PO TABS: 1.0000 | ORAL_TABLET | Freq: Four times a day (QID) | ORAL | 0 refills | Status: DC | PRN

## 2019-03-23 MED ORDER — ONDANSETRON 4 MG PO TBDP: 4.0000 mg | ORAL_TABLET | Freq: Three times a day (TID) | ORAL | 0 refills | Status: DC | PRN

## 2019-03-23 MED ORDER — LIDOCAINE VISCOUS HCL 2 % MT SOLN
15.0000 mL | Freq: Once | OROMUCOSAL | Status: DC
  Filled 2019-03-23: qty 15

## 2019-03-23 MED ORDER — LIDOCAINE VISCOUS HCL 2 % MT SOLN
15.0000 mL | Freq: Once | OROMUCOSAL | Status: AC
  Administered 2019-03-23: 15 mL via ORAL
  Filled 2019-03-23: qty 15

## 2019-03-23 MED ORDER — ALUM & MAG HYDROXIDE-SIMETH 200-200-20 MG/5ML PO SUSP
30.0000 mL | Freq: Once | ORAL | Status: AC
  Administered 2019-03-23: 30 mL via ORAL
  Filled 2019-03-23: qty 30

## 2019-03-23 MED ORDER — HYDROCODONE-ACETAMINOPHEN 5-325 MG PO TABS
1.0000 | ORAL_TABLET | Freq: Once | ORAL | Status: DC
  Filled 2019-03-23: qty 1

## 2019-03-23 MED ORDER — ONDANSETRON HCL 4 MG/2ML IJ SOLN
4.0000 mg | Freq: Once | INTRAMUSCULAR | Status: AC
  Administered 2019-03-23: 4 mg via INTRAVENOUS
  Filled 2019-03-23: qty 2

## 2019-03-23 NOTE — ED Notes (Signed)
Pt states she has no pain and does not want pain meds or viscous lidocaine at present. Pt given crackers and sprite for po challenge.

## 2019-03-23 NOTE — Discharge Instructions (Signed)
As we discussed, try to avoid foods high in fats.  Eat frequent, small meals.  I have prescribed pain medication to take as needed.  If you do develop recurrent pain, take these medications.  If your pain is persistently severe after 2 to 3 hours, present back to the ER.  If you have uncontrolled vomiting, come back to the ER.

## 2019-03-23 NOTE — ED Triage Notes (Signed)
Patient with complaint of epigastric pain and vomiting times one that started about 01:00 this morning.

## 2019-03-23 NOTE — ED Notes (Signed)
Pt states she feels fine, denies dizziness, pain or nausea. Esig pad not working, pt initials via computer for [employer]

## 2019-03-23 NOTE — ED Notes (Signed)
Pt sitting in wheelchair, in NAD

## 2019-03-23 NOTE — ED Provider Notes (Signed)
[facility] Emergency Department Provider Note  ____________________________________________   First MD Initiated Contact with Patient 03/23/19 [phone]     (approximate)  I have reviewed the triage vital signs and the nursing notes.   HISTORY  Chief Complaint Abdominal Pain    HPI Sherry Spence is a 28 y.o. female  Here with epigastric and RUQ pain. Pt went to bed last night shortly after eating a hamburger and milkshake. She felt fine at the time. She woke up this AM around 5:30 with severe epigastric pain followed by emesis x 1. No blood or bile in emesis. Since then, she's had persistent severe epigastric and RUQ pain, along with nausea. She had one additional episode of NBNB emesis prior to arrival. She has had two soft but non liquid Bm as well. No recent sick contacts. No h/o gallstones that she is aware of. No fever, chills. No other complaints. Pain is worse w/ eating, palpation. No alleviating factors.        Past Medical History:  Diagnosis Date   Allergy    BMI 40.0-44.9, adult ([place]) 03/18/2016   Cholelithiasis    GERD (gastroesophageal reflux disease) 01/20/2014   Headache    History of chlamydia infection     Patient Active Problem List   Diagnosis Date Noted   Microcytic anemia 01/14/2019   Seasonal allergies 01/14/2019   BMI 40.0-44.9, adult ([place]) 03/18/2016   GERD (gastroesophageal reflux disease) 01/20/2014    Past Surgical History:  Procedure Laterality Date   NO PAST SURGERIES      Prior to Admission medications   Medication Sig Start Date End Date Taking? Authorizing Provider  albuterol (VENTOLIN HFA) 108 (90 Base) MCG/ACT inhaler Inhale 2 puffs into the lungs every 6 (six) hours as needed for wheezing or shortness of breath. 11/15/18   Hubbard Hartshorn, FNP  cromolyn (OPTICROM) 4 % ophthalmic solution Place 1 drop into both eyes 4 (four) times daily as needed (for itchy, watery or swollen eye sx). Patient not  taking: Reported on 02/22/2019 01/15/19   Delsa Grana, PA-C  Etonogestrel (NEXPLANON [place]) Inject into the skin.    [provider]  famotidine (PEPCID) 20 MG tablet Take 1 tablet (20 mg total) by mouth daily. Patient not taking: Reported on 02/22/2019 11/15/18   Hubbard Hartshorn, FNP  fluconazole (DIFLUCAN) 150 MG tablet Take 1 tablet (150 mg total) by mouth every 3 (three) days as needed (for vaginal itching/yeast infection sx). 02/22/19   Delsa Grana, PA-C  fluticasone (FLONASE) 50 MCG/ACT nasal spray Place 2 sprays into both nostrils daily. Patient not taking: Reported on 02/22/2019 01/15/19   Delsa Grana, PA-C  HYDROcodone-acetaminophen (NORCO/VICODIN) 5-325 MG tablet Take 1-2 tablets by mouth every 6 (six) hours as needed for moderate pain or severe pain. 03/23/19 03/22/20  Duffy Bruce, MD  levocetirizine (XYZAL) 5 MG tablet Take 1 tablet (5 mg total) by mouth every evening. 01/15/19   Delsa Grana, PA-C  montelukast (SINGULAIR) 10 MG tablet Take 1 tablet (10 mg total) by mouth at bedtime. Patient not taking: Reported on 02/22/2019 01/15/19   Delsa Grana, PA-C  Olopatadine HCl (PATADAY) 0.2 % SOLN Apply 1 drop to eye daily. Patient not taking: Reported on 02/22/2019 11/22/18   Hubbard Hartshorn, FNP  ondansetron (ZOFRAN ODT) 4 MG disintegrating tablet Take 1 tablet (4 mg total) by mouth every 8 (eight) hours as needed for nausea or vomiting. 03/23/19   Duffy Bruce, MD  phentermine (ADIPEX-P) 37.5 MG tablet  Take 1 tablet (37.5 mg total) by mouth daily before breakfast. Patient not taking: Reported on 02/22/2019 01/15/19   Delsa Grana, PA-C    Allergies Compazine [prochlorperazine edisylate]  Family History  Problem Relation Age of Onset   Hypertension Father    Diabetes Paternal Aunt    Cancer Maternal Grandmother    Diabetes Maternal Grandmother    Dementia Maternal Grandmother    Diabetes Paternal Grandmother    Hypertension Paternal Grandmother    Asthma Mother     Asthma Sister    Cancer Paternal Grandfather    Crohn's disease Sister    Asthma Sister    Mental retardation Neg Hx     Social History Social History   Tobacco Use   Smoking status: Never Smoker   Smokeless tobacco: Never Used  Substance Use Topics   Alcohol use: No   Drug use: No    Review of Systems  Review of Systems  Constitutional: Negative for fatigue and fever.  HENT: Negative for congestion and sore throat.   Eyes: Negative for visual disturbance.  Respiratory: Negative for cough and shortness of breath.   Cardiovascular: Negative for chest pain.  Gastrointestinal: Positive for abdominal pain, nausea and vomiting. Negative for diarrhea.  Genitourinary: Negative for flank pain.  Musculoskeletal: Negative for back pain and neck pain.  Skin: Negative for rash and wound.  All other systems reviewed and are negative.    ____________________________________________  PHYSICAL EXAM:      VITAL SIGNS: ED Triage Vitals  Enc Vitals Group     BP 03/23/19 0644 138/82     Pulse Rate 03/23/19 0644 88     Resp 03/23/19 0644 20     Temp 03/23/19 0644 97.7 F (36.5 C)     Temp Source 03/23/19 0644 Oral     SpO2 03/23/19 0644 100 %     Weight 03/23/19 0641 270 lb (122.5 kg)     Height 03/23/19 0641 5\' 6"  (1.676 m)     Head Circumference --      Peak Flow --      Pain Score 03/23/19 0641 10     Pain Loc --      Pain Edu? --      Excl. in [place]? --      Physical Exam Vitals signs and nursing note reviewed.  Constitutional:      General: She is not in acute distress.    Appearance: She is well-developed.  HENT:     Head: Normocephalic and atraumatic.  Eyes:     Conjunctiva/sclera: Conjunctivae normal.  Neck:     Musculoskeletal: Neck supple.  Cardiovascular:     Rate and Rhythm: Normal rate and regular rhythm.     Heart sounds: Normal heart sounds. No murmur. No friction rub.  Pulmonary:     Effort: Pulmonary effort is normal. No respiratory distress.       Breath sounds: Normal breath sounds. No wheezing or rales.  Abdominal:     General: There is no distension.     Palpations: Abdomen is soft.     Tenderness: There is abdominal tenderness in the right upper quadrant and epigastric area. There is no guarding or rebound.  Skin:    General: Skin is warm.     Capillary Refill: Capillary refill takes less than 2 seconds.  Neurological:     Mental Status: She is alert and oriented to person, place, and time.     Motor: No abnormal muscle tone.  ____________________________________________   LABS (all labs ordered are listed, but only abnormal results are displayed)  Labs Reviewed  COMPREHENSIVE METABOLIC PANEL - Abnormal; Notable for the following components:      Result Value   Glucose, Bld 118 (*)    Calcium 8.6 (*)    Albumin 3.4 (*)    All other components within normal limits  URINALYSIS, COMPLETE (UACMP) WITH MICROSCOPIC - Abnormal; Notable for the following components:   Color, Urine YELLOW (*)    APPearance HAZY (*)    Specific Gravity, Urine 1.032 (*)    Protein, ur 30 (*)    All other components within normal limits  CBC - Abnormal; Notable for the following components:   WBC 12.9 (*)    MCH 23.6 (*)    MCHC 29.5 (*)    Platelets 480 (*)    All other components within normal limits  LIPASE, BLOOD  POC URINE PREG, ED    ____________________________________________  EKG: Normal sinus rhythm, ventricular rate 80.  PR 188, QRS 84, QTc 454.  No acute ST or T-segment changes.  Nonspecific T wave inversions, no ST elevations or depressions. ________________________________________  RADIOLOGY All imaging, including plain films, CT scans, and ultrasounds, independently reviewed by me, and interpretations confirmed via formal radiology reads.  ED MD interpretation:   RUQ U/S: cholelithiasis, no cholecystitis  Official radiology report(s): US Abdomen Limited Ruq  Result Date: 03/23/2019 CLINICAL DATA:   Right upper quadrant pain for approximately 9 hours. EXAM: ULTRASOUND ABDOMEN LIMITED RIGHT UPPER QUADRANT COMPARISON:  None. FINDINGS: Gallbladder: Dependent gallstones. No gallbladder wall thickening. No pericholecystic fluid. Common bile duct: Diameter: 5 mm. Liver: No focal lesion identified. Within normal limits in parenchymal echogenicity. Portal vein is patent on color Doppler imaging with normal direction of blood flow towards the liver. Other: None. IMPRESSION: 1. Cholelithiasis without evidence of acute cholecystitis. 2. No other abnormalities. Electronically Signed   By: Lajean Manes M.D.   On: 03/23/2019 10:16    ____________________________________________  PROCEDURES   Procedure(s) performed (including Critical Care):  Procedures  ____________________________________________  INITIAL IMPRESSION / MDM / [place] / ED COURSE  As part of my medical decision making, I reviewed the following data within the electronic MEDICAL RECORD NUMBER Notes from prior ED visits and [place] Controlled Substance Database      *AARYN TRESSEL was evaluated in Emergency Department on 03/23/2019 for the symptoms described in the history of present illness. She was evaluated in the context of the global COVID-19 pandemic, which necessitated consideration that the patient might be at risk for infection with the SARS-CoV-2 virus that causes COVID-19. Institutional protocols and algorithms that pertain to the evaluation of patients at risk for COVID-19 are in a state of rapid change based on information released by regulatory bodies including the CDC and federal and state organizations. These policies and algorithms were followed during the patient's care in the ED.  Some ED evaluations and interventions may be delayed as a result of limited staffing during the pandemic.*   Clinical Course as of Mar 22 1256  Sat Mar 23, 2019  0928 28 yo F here with RUQ and epigastric abd pain. DDx includes  gastritis, GERD, gastroparesis, cholecystitis, pancreatitis, hepatitis. No lower abd TTP or signs of obstruction clinically. No overt CP or sx to suggest referred cardiopulm abnormality. U/S pending.   [CI]  [id number] U/S confirms gallstones, no cholecystitis. Pain resolved. Plan to f/u urine, UPT. If neg, can d/c with symptomatic stones and  outpt follow-up. Discussed return precautions including recurrence of pain, fever, uncontrolled vomiting, any other signs of cholecystitis or choledocholithiasis.   [CI]    Clinical Course User Index [CI] Duffy Bruce, MD    Medical Decision Making:  As above.  ____________________________________________  FINAL CLINICAL IMPRESSION(S) / ED DIAGNOSES  Final diagnoses:  RUQ pain  Calculus of bile duct without cholecystitis and without obstruction     MEDICATIONS GIVEN DURING THIS VISIT:  Medications  lidocaine (XYLOCAINE) 2 % viscous mouth solution 15 mL (15 mLs Oral Refused 03/23/19 1134)  HYDROcodone-acetaminophen (NORCO/VICODIN) 5-325 MG per tablet 1 tablet (1 tablet Oral Refused 03/23/19 1135)  sodium chloride 0.9 % bolus 1,000 mL (0 mLs Intravenous Stopped 03/23/19 1109)  ondansetron (ZOFRAN) injection 4 mg (4 mg Intravenous Given 03/23/19 0953)  HYDROmorphone (DILAUDID) injection 1 mg (1 mg Intravenous Given 03/23/19 0952)  alum & mag hydroxide-simeth (MAALOX/MYLANTA) 200-200-20 MG/5ML suspension 30 mL (30 mLs Oral Given 03/23/19 0959)    And  lidocaine (XYLOCAINE) 2 % viscous mouth solution 15 mL (15 mLs Oral Given 03/23/19 0959)     ED Discharge Orders         Ordered    HYDROcodone-acetaminophen (NORCO/VICODIN) 5-325 MG tablet  Every 6 hours PRN     03/23/19 1115    ondansetron (ZOFRAN ODT) 4 MG disintegrating tablet  Every 8 hours PRN     03/23/19 1115           Note:  This document was prepared using Dragon voice recognition software and may include unintentional dictation errors.   Duffy Bruce, MD 03/23/19 1257

## 2019-03-23 NOTE — ED Notes (Signed)
US at bedside

## 2019-03-23 NOTE — ED Notes (Signed)
Pt able to ambulate to the restroom without difficulty or distress

## 2019-03-23 NOTE — ED Notes (Signed)
Pt states she feels fine to walk to lobby. Denies dizziness. Pt dcd to lobby

## 2019-03-25 LAB — POCT PREGNANCY, URINE: Preg Test, Ur: NEGATIVE

## 2019-04-02 ENCOUNTER — Other Ambulatory Visit: Payer: Self-pay | Admitting: Surgery

## 2019-04-02 DIAGNOSIS — K8050 Calculus of bile duct without cholangitis or cholecystitis without obstruction: Secondary | ICD-10-CM

## 2019-04-16 ENCOUNTER — Other Ambulatory Visit: Payer: Self-pay

## 2019-04-16 ENCOUNTER — Encounter
Admission: RE | Admit: 2019-04-16 | Discharge: 2019-04-16 | Disposition: A | Discharge status: Home / Self Care | Payer: BC Managed Care – PPO | Source: Ambulatory Visit | Attending: Surgery | Admitting: Surgery

## 2019-04-16 DIAGNOSIS — K8050 Calculus of bile duct without cholangitis or cholecystitis without obstruction: Secondary | ICD-10-CM | POA: Insufficient documentation

## 2019-04-16 MED ORDER — TECHNETIUM TC 99M MEBROFENIN IV KIT
5.0000 | PACK | Freq: Once | INTRAVENOUS | Status: AC | PRN
  Administered 2019-04-16: 5.632 via INTRAVENOUS

## 2019-04-27 ENCOUNTER — Other Ambulatory Visit: Payer: Self-pay | Admitting: Family Medicine

## 2019-07-29 ENCOUNTER — Telehealth: Payer: BC Managed Care – PPO | Admitting: Emergency Medicine

## 2019-07-29 DIAGNOSIS — R0789 Other chest pain: Secondary | ICD-10-CM

## 2019-07-29 NOTE — Progress Notes (Signed)
I had a phone consultation with the patient who relayed sxs of cp, sob. Referred to urgent eval.  Based on what you shared with me, I feel your condition warrants further evaluation and I recommend that you be seen for a face to face office visit.   NOTE: If you entered your credit card information for this eVisit, you will not be charged. You may see a "hold" on your card for the $35 but that hold will drop off and you will not have a charge processed.   If you are having a true medical emergency please call 911.      For an urgent face to face visit, [place] has five urgent care centers for your convenience:      NEW:  [facility] [place] [facility] [place] Get Driving Directions [id number] [address] [place], [place] [address] . 10 am - 6pm Monday - Friday    [place] [facility] [place]) Get Driving Directions [id number] [address] [place], [place] [address] . 10 am to 8 pm Monday-Friday . 12 pm to 8 pm [facility] at [facility] Get Driving Directions [id number] [address], [place], [place] [address] . 8 am to 8 pm Monday-Friday . 9 am to 6 pm Saturday . 11 am to 6 pm Sunday     [facility] at [facility] Get Driving Directions  [id number] [address], [place] [address] . 8 am to 8 pm Monday-Friday . 8 am to 4 pm [facility]gent Care at  Get Driving Directions [id number] [address] [place], [place] [address] . 12 pm to 6 pm Monday-Friday      Your e-visit answers were reviewed by a board certified advanced clinical practitioner to complete your personal care plan.  Thank you for using e-Visits.   Greater than 5 but less than 10 minutes spent researching, coordinating, and implementing care for this patient today

## 2019-07-30 ENCOUNTER — Other Ambulatory Visit: Payer: Self-pay

## 2019-07-30 ENCOUNTER — Ambulatory Visit
Admission: RE | Admit: 2019-07-30 | Discharge: 2019-07-30 | Disposition: A | Discharge status: Home / Self Care | Payer: BC Managed Care – PPO | Source: Ambulatory Visit | Attending: Family Medicine | Admitting: Family Medicine

## 2019-07-30 ENCOUNTER — Ambulatory Visit: Payer: Medicaid Other | Admitting: Family Medicine

## 2019-07-30 ENCOUNTER — Encounter: Payer: Self-pay | Admitting: Family Medicine

## 2019-07-30 VITALS — BP 116/82 | HR 100 | Temp 97.3°F | Resp 14 | Ht 67.0 in | Wt 265.9 lb

## 2019-07-30 DIAGNOSIS — R0789 Other chest pain: Secondary | ICD-10-CM

## 2019-07-30 MED ORDER — PANTOPRAZOLE SODIUM 20 MG PO TBEC: 20.0000 mg | DELAYED_RELEASE_TABLET | ORAL | 1 refills | Status: DC

## 2019-07-30 NOTE — Patient Instructions (Addendum)
Take protonix first thing in the morning on an empty stomach - wait 30 min before eating Can add pepcid over the counter as needed for symptoms throughout the day (1-2 x) as needed   See the handout below and work on your diet and lifestyle habits you tend to do when your stomach gets upset  In 2-3 weeks if you're feeling better you can start to decrease protonix - skip every other day and see how you feel.    Food Choices for Gastroesophageal Reflux Disease, Adult When you have gastroesophageal reflux disease (GERD), the foods you eat and your eating habits are very important. Choosing the right foods can help ease your discomfort. Think about working with a nutrition specialist (dietitian) to help you make good choices. What are tips for following this plan?  Meals  Choose healthy foods that are low in fat, such as fruits, vegetables, whole grains, low-fat dairy products, and lean meat, fish, and poultry.  Eat small meals often instead of 3 large meals a day. Eat your meals slowly, and in a place where you are relaxed. Avoid bending over or lying down until 2-3 hours after eating.  Avoid eating meals 2-3 hours before bed.  Avoid drinking a lot of liquid with meals.  Cook foods using methods other than frying. Bake, grill, or broil food instead.  Avoid or limit: ? Chocolate. ? Peppermint or spearmint. ? Alcohol. ? Pepper. ? Black and decaffeinated coffee. ? Black and decaffeinated tea. ? Bubbly (carbonated) soft drinks. ? Caffeinated energy drinks and soft drinks.  Limit high-fat foods such as: ? Fatty meat or fried foods. ? Whole milk, cream, butter, or ice cream. ? Nuts and nut butters. ? Pastries, donuts, and sweets made with butter or shortening.  Avoid foods that cause symptoms. These foods may be different for everyone. Common foods that cause symptoms include: ? Tomatoes. ? Oranges, lemons, and limes. ? Peppers. ? Spicy food. ? Onions and  garlic. ? Vinegar. Lifestyle  Maintain a healthy weight. Ask your doctor what weight is healthy for you. If you need to lose weight, work with your doctor to do so safely.  Exercise for at least 30 minutes for 5 or more days each week, or as told by your doctor.  Wear loose-fitting clothes.  Do not smoke. If you need help quitting, ask your doctor.  Sleep with the head of your bed higher than your feet. Use a wedge under the mattress or blocks under the bed frame to raise the head of the bed. Summary  When you have gastroesophageal reflux disease (GERD), food and lifestyle choices are very important in easing your symptoms.  Eat small meals often instead of 3 large meals a day. Eat your meals slowly, and in a place where you are relaxed.  Limit high-fat foods such as fatty meat or fried foods.  Avoid bending over or lying down until 2-3 hours after eating.  Avoid peppermint and spearmint, caffeine, alcohol, and chocolate. This information is not intended to replace advice given to you by your health care provider. Make sure you discuss any questions you have with your health care provider. Document Revised: 08/30/2018 Document Reviewed: 06/14/2016 Elsevier Patient Education  [employer].       Nonspecific Chest Pain Chest pain can be caused by many different conditions. Some causes of chest pain can be life-threatening. These will require treatment right away. Serious causes of chest pain include:  Heart attack.  A tear in the  body's main blood vessel.  Redness and swelling (inflammation) around your heart.  Blood clot in your lungs. Other causes of chest pain may not be so serious. These include:  Heartburn.  Anxiety or stress.  Damage to bones or muscles in your chest.  Lung infections. Chest pain can feel like:  Pain or discomfort in your chest.  Crushing, pressure, aching, or squeezing pain.  Burning or tingling.  Dull or sharp pain that is worse  when you move, cough, or take a deep breath.  Pain or discomfort that is also felt in your back, neck, jaw, shoulder, or arm, or pain that spreads to any of these areas. It is hard to know whether your pain is caused by something that is serious or something that is not so serious. So it is important to see your doctor right away if you have chest pain. Follow these instructions at home: Medicines  Take over-the-counter and prescription medicines only as told by your doctor.  If you were prescribed an antibiotic medicine, take it as told by your doctor. Do not stop taking the antibiotic even if you start to feel better. Lifestyle   Rest as told by your doctor.  Do not use any products that contain nicotine or tobacco, such as cigarettes, e-cigarettes, and chewing tobacco. If you need help quitting, ask your doctor.  Do not drink alcohol.  Make lifestyle changes as told by your doctor. These may include: ? Getting regular exercise. Ask your doctor what activities are safe for you. ? Eating a heart-healthy diet. A diet and nutrition specialist (dietitian) can help you to learn healthy eating options. ? Staying at a healthy weight. ? Treating diabetes or high blood pressure, if needed. ? Lowering your stress. Activities such as yoga and relaxation techniques can help. General instructions  Pay attention to any changes in your symptoms. Tell your doctor about them or any new symptoms.  Avoid any activities that cause chest pain.  Keep all follow-up visits as told by your doctor. This is important. You may need more testing if your chest pain does not go away. Contact a doctor if:  Your chest pain does not go away.  You feel depressed.  You have a fever. Get help right away if:  Your chest pain is worse.  You have a cough that gets worse, or you cough up blood.  You have very bad (severe) pain in your belly (abdomen).  You pass out (faint).  You have either of these for no  clear reason: ? Sudden chest discomfort. ? Sudden discomfort in your arms, back, neck, or jaw.  You have shortness of breath at any time.  You suddenly start to sweat, or your skin gets clammy.  You feel sick to your stomach (nauseous).  You throw up (vomit).  You suddenly feel lightheaded or dizzy.  You feel very weak or tired.  Your heart starts to beat fast, or it feels like it is skipping beats. These symptoms may be an emergency. Do not wait to see if the symptoms will go away. Get medical help right away. Call your local emergency services (911 in the [place] Do not drive yourself to the hospital. Summary  Chest pain can be caused by many different conditions. The cause may be serious and need treatment right away. If you have chest pain, see your doctor right away.  Follow your doctor's instructions for taking medicines and making lifestyle changes.  Keep all follow-up visits as told by  your doctor. This includes visits for any further testing if your chest pain does not go away.  Be sure to know the signs that show that your condition has become worse. Get help right away if you have these symptoms. This information is not intended to replace advice given to you by your health care provider. Make sure you discuss any questions you have with your health care provider. Document Revised: 11/09/2017 Document Reviewed: 11/09/2017 Elsevier Patient Education  2020 [employer].

## 2019-07-30 NOTE — Progress Notes (Signed)
Patient ID: Sherry Spence, female    DOB: 1990-06-25, 29 y.o.   MRN: [id number]  PCP: Delsa Grana, PA-C  No chief complaint on file.   Subjective:   Sherry Spence is a 29 y.o. 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 She had been taking NSAIDs and tylenol #3 for dental procedure, recently increased ibuprofen from 400 mg a few times a day to 800 mg.   She denies any cough, wheeze, fever, chills, sweats, URI sx, sore throat, palpitations, near syncope, orthopnea, exertional CP or dyspnea.   She has hx of GERD - easily flared by food choices and stress.  Has some hx of panic attack as well.     Recently had HIDA scan that was neg, she had cholelithiasis about 2 years ago with her pregnancy  Pain feels sore, achy, fairly constant to anterior chest wall and some discomfort in central upper chest, she also has muscle tension and discomfort to neck, between shoulder blades, some sensation of SOB, she denies any radiation through her chest, she doesn't feel like its her heart, but she is concerned that she feels dyspneic     Patient Active Problem List   Diagnosis Date Noted  . Microcytic anemia 01/14/2019  . Seasonal allergies 01/14/2019  . BMI 40.0-44.9, adult ([place]) 03/18/2016  . GERD (gastroesophageal reflux disease) 01/20/2014      Current Outpatient Medications:  .  albuterol (VENTOLIN HFA) 108 (90 Base) MCG/ACT inhaler, Inhale 2 puffs into the lungs every 6 (six) hours as needed for wheezing or shortness of breath., Disp: 18  g, Rfl: 1 .  cromolyn (OPTICROM) 4 % ophthalmic solution, Place 1 drop into both eyes 4 (four) times daily as needed (for itchy, watery or swollen eye sx). (Patient not taking: Reported on 02/22/2019), Disp: 10 mL, Rfl: 2 .  Etonogestrel (NEXPLANON [place]), Inject into the skin., Disp: , Rfl:  .  famotidine (PEPCID) 20 MG tablet, Take 1 tablet (20 mg total) by mouth daily. (Patient not taking: Reported on 02/22/2019), Disp: 90 tablet, Rfl: 0 .  fluconazole (DIFLUCAN) 150 MG tablet, Take 1 tablet (150 mg total) by mouth every 3 (three) days as needed (for vaginal itching/yeast infection sx)., Disp: 2 tablet, Rfl: 0 .  fluticasone (FLONASE) 50 MCG/ACT nasal spray, Place 2 sprays into both nostrils daily. (Patient not taking: Reported on 02/22/2019), Disp: 16 g, Rfl: 6 .  HYDROcodone-acetaminophen (NORCO/VICODIN) 5-325 MG tablet, Take 1-2 tablets by mouth every 6 (six) hours as needed for moderate pain or severe pain., Disp: 12 tablet, Rfl: 0 .  levocetirizine (XYZAL) 5 MG tablet, Take 1 tablet (5 mg total) by mouth every evening., Disp: 30 tablet, Rfl: 11 .  montelukast (SINGULAIR) 10 MG tablet, Take 1 tablet (10 mg total) by mouth at bedtime. (Patient not taking: Reported on 02/22/2019), Disp: 30 tablet, Rfl: 5 .  Olopatadine HCl (PATADAY) 0.2 % SOLN, Apply 1 drop to eye daily. (Patient not taking: Reported on 02/22/2019), Disp: 2.5 mL, Rfl: 2 .  ondansetron (  ZOFRAN ODT) 4 MG disintegrating tablet, Take 1 tablet (4 mg total) by mouth every 8 (eight) hours as needed for nausea or vomiting., Disp: 12 tablet, Rfl: 0 .  phentermine (ADIPEX-P) 37.5 MG tablet, Take 1 tablet (37.5 mg total) by mouth daily before breakfast. (Patient not taking: Reported on 02/22/2019), Disp: 30 tablet, Rfl: 0   Allergies  Allergen Reactions  . Compazine [Prochlorperazine Edisylate] Anxiety     Family History  Problem Relation Age of Onset  . Hypertension Father   . Diabetes Paternal Aunt   . Cancer Maternal Grandmother   .  Diabetes Maternal Grandmother   . Dementia Maternal Grandmother   . Diabetes Paternal Grandmother   . Hypertension Paternal Grandmother   . Asthma Mother   . Asthma Sister   . Cancer Paternal Grandfather   . Crohn's disease Sister   . Asthma Sister   . Mental retardation Neg Hx      Social History   Socioeconomic History  . Marital status: Significant Other    Spouse name: Not on file  . Number of children: 3  . Years of education: 44  . Highest education level: Associate degree: occupational, Hotel manager, or vocational program  Occupational History  . Not on file  Tobacco Use  . Smoking status: Never Smoker  . Smokeless tobacco: Never Used  Substance and Sexual Activity  . Alcohol use: No  . Drug use: No  . Sexual activity: Yes    Birth control/protection: Implant  Other Topics Concern  . Not on file  Social History Narrative   Getting Married Sept 20th (? Or close to that) 2020 with fiance of several years   Three children ages 36, 4 and [place]   Social Determinants of Health   Financial Resource Strain: Low Risk   . Difficulty of Paying Living Expenses: Not hard at all  Food Insecurity: No Food Insecurity  . Worried About Charity fundraiser in the Last Year: Never true  . Ran Out of Food in the Last Year: Never true  Transportation Needs: No Transportation Needs  . Lack of Transportation (Medical): No  . Lack of Transportation (Non-Medical): No  Physical Activity: Insufficiently Active  . Days of Exercise per Week: 1 day  . Minutes of Exercise per Session: 20 min  Stress: No Stress Concern Present  . Feeling of Stress : Not at all  Social Connections: Unknown  . Frequency of Communication with Friends and Family: Once a week  . Frequency of Social Gatherings with Friends and Family: Once a week  . Attends Religious Services: Never  . Active Member of Clubs or Organizations: No  . Attends Theatre manager Meetings: Never  . Marital Status: Not on file  Intimate Partner Violence: Not At Risk  . Fear of Current or Ex-Partner: No  . Emotionally Abused: No  . Physically Abused: No  . Sexually Abused: No    Chart Review Today: I personally reviewed active problem list, medication list, allergies, family history, social history, health maintenance, notes from last encounter, lab results, imaging with the patient/caregiver today.   Review of Systems 10 Systems reviewed and are negative for acute change except as noted in the HPI.     Objective:   Vitals:   07/30/19 1524  BP: 116/82  Pulse: 100  Resp: 14  Temp: (!) 97.3 F (36.3 C)  SpO2: 99%  Weight: 265  lb 14.4 oz (120.6 kg)  Height: 5\' 7"  (1.702 m)    Body mass index is 41.65 kg/m.  Physical Exam Vitals and nursing note reviewed.  Constitutional:      General: She is not in acute distress.    Appearance: Normal appearance. She is well-developed. She is not ill-appearing, toxic-appearing or diaphoretic.     Interventions: Face mask in place.  HENT:     Head: Normocephalic and atraumatic.     Right Ear: External ear normal.     Left Ear: External ear normal.  Eyes:     General: Lids are normal. No scleral icterus.       Right eye: No discharge.        Left eye: No discharge.     Conjunctiva/sclera: Conjunctivae normal.  Neck:     Thyroid: No thyroid mass, thyromegaly or thyroid tenderness.     Trachea: Trachea and phonation normal. No tracheal deviation.  Cardiovascular:     Rate and Rhythm: Normal rate and regular rhythm.     Pulses: Normal pulses.          Radial pulses are 2+ on the right side and 2+ on the left side.       Posterior tibial pulses are 2+ on the right side and 2+ on the left side.     Heart sounds: Normal heart sounds. No murmur. No friction rub. No gallop.   Pulmonary:     Effort: Pulmonary effort is normal. No respiratory distress.     Breath sounds: Normal breath sounds. No  stridor. No wheezing, rhonchi or rales.  Chest:     Chest wall: Tenderness present. No mass, deformity, swelling, crepitus or edema.  Abdominal:     General: Bowel sounds are normal. There is no distension.     Palpations: Abdomen is soft.     Tenderness: There is no abdominal tenderness. There is no right CVA tenderness, left CVA tenderness, guarding or rebound.  Musculoskeletal:        General: No deformity. Normal range of motion.     Cervical back: Normal range of motion and neck supple.     Right lower leg: No edema.     Left lower leg: No edema.     Comments: Paraspinal muscles mildly ttp to mid to upper thoracic area and cervical, mildly tender and increased tone to b/l trapezius muscles from shoulders to neck Good rom of back and b/l shoulder  Lymphadenopathy:     Cervical: No cervical adenopathy.  Skin:    General: Skin is warm and dry.     Capillary Refill: Capillary refill takes less than 2 seconds.     Coloration: Skin is not jaundiced or pale.     Findings: No rash.  Neurological:     Mental Status: She is alert and oriented to person, place, and time.     Motor: No abnormal muscle tone.     Gait: Gait normal.  Psychiatric:        Mood and Affect: Mood normal.        Speech: Speech normal.        Behavior: Behavior normal.      Results for orders placed or performed during the hospital encounter of 03/23/19  Lipase, blood  Result Value Ref Range   Lipase 47 11 - 51 U/L  Comprehensive metabolic panel  Result Value Ref Range   Sodium 141 135 - 145 mmol/L   Potassium 3.8 3.5 - 5.1 mmol/L  Chloride 109 98 - 111 mmol/L   CO2 23 22 - 32 mmol/L   Glucose, Bld 118 (H) 70 - 99 mg/dL   BUN 14 6 - 20 mg/dL   Creatinine, Ser 0.70 0.44 - 1.00 mg/dL   Calcium 8.6 (L) 8.9 - 10.3 mg/dL   Total Protein 7.2 6.5 - 8.1 g/dL   Albumin 3.4 (L) 3.5 - 5.0 g/dL   AST 33 15 - 41 U/L   ALT 22 0 - 44 U/L   Alkaline Phosphatase 104 38 - 126 U/L   Total Bilirubin 0.3 0.3 - 1.2  mg/dL   GFR calc non Af Amer >60 >60 mL/min   GFR calc Af Amer >60 >60 mL/min   Anion gap 9 5 - 15  Urinalysis, Complete w Microscopic  Result Value Ref Range   Color, Urine YELLOW (A) YELLOW   APPearance HAZY (A) CLEAR   Specific Gravity, Urine 1.032 (H) 1.005 - 1.030   pH 7.0 5.0 - 8.0   Glucose, UA NEGATIVE NEGATIVE mg/dL   Hgb urine dipstick NEGATIVE NEGATIVE   Bilirubin Urine NEGATIVE NEGATIVE   Ketones, ur NEGATIVE NEGATIVE mg/dL   Protein, ur 30 (A) NEGATIVE mg/dL   Nitrite NEGATIVE NEGATIVE   Leukocytes,Ua NEGATIVE NEGATIVE   RBC / HPF 0-5 0 - 5 RBC/hpf   WBC, UA 0-5 0 - 5 WBC/hpf   Bacteria, UA NONE SEEN NONE SEEN   Squamous Epithelial / LPF 0-5 0 - 5   Mucus PRESENT   CBC  Result Value Ref Range   WBC 12.9 (H) 4.0 - 10.5 K/uL   RBC 5.09 3.87 - 5.11 MIL/uL   Hemoglobin 12.0 12.0 - 15.0 g/dL   HCT 40.7 36.0 - 46.0 %   MCV 80.0 80.0 - 100.0 fL   MCH 23.6 (L) 26.0 - 34.0 pg   MCHC 29.5 (L) 30.0 - 36.0 g/dL   RDW 14.8 11.5 - 15.5 %   Platelets 480 (H) 150 - 400 K/uL   nRBC 0.0 0.0 - 0.2 %  Pregnancy, urine POC  Result Value Ref Range   Preg Test, Ur NEGATIVE NEGATIVE   ECG interpretation: ECG unremarkable - NSR - will be scanned into chart see other note for interpretation     Assessment & Plan:      ICD-10-CM   1. 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  It is reproducible and history fits -  Suggested tx to pt includes gentle stretching and OTC meds/heating pad, will improve with time. Pain also has a GERD/esophagitis aspect to it with hx of the same, flares up with stress and poor food choices, it did improve with one dose of pepcid - discussed lifestyle/food choices to tx gerd, encouraged her to tx with at least 2-3 weeks of protonix and wean off after that when  improved.  Encouraged her to f/up sooner if not improving, and we reviewed concerning cardiac sx which she should f/up for immediately.     CXR reports and images personally reviewed and normal EKG NSR - no change from past EKG, reviewed with pt and reassured her that her pain is unlikely cardiac in nature  Hand out given to pt on food choices, meds to tx, and then also pt given handout on  CP with annotated concerning sx that she should go to the ER or call 911 for.   Delsa Grana, PA-C 07/30/19 3:25 PM

## 2019-07-31 ENCOUNTER — Encounter: Payer: Self-pay | Admitting: Family Medicine

## 2019-08-19 ENCOUNTER — Encounter: Payer: Self-pay | Admitting: Family Medicine

## 2019-08-24 ENCOUNTER — Other Ambulatory Visit: Payer: Self-pay | Admitting: Family Medicine

## 2019-08-24 DIAGNOSIS — R0789 Other chest pain: Secondary | ICD-10-CM

## 2019-08-26 ENCOUNTER — Other Ambulatory Visit: Payer: Self-pay | Admitting: Family Medicine

## 2019-08-26 DIAGNOSIS — J302 Other seasonal allergic rhinitis: Secondary | ICD-10-CM

## 2019-08-26 DIAGNOSIS — H1013 Acute atopic conjunctivitis, bilateral: Secondary | ICD-10-CM

## 2019-08-26 DIAGNOSIS — J45909 Unspecified asthma, uncomplicated: Secondary | ICD-10-CM

## 2019-08-26 MED ORDER — MONTELUKAST SODIUM 10 MG PO TABS: 10.0000 mg | ORAL_TABLET | Freq: Every day | ORAL | 11 refills | Status: DC

## 2019-08-26 MED ORDER — LEVOCETIRIZINE DIHYDROCHLORIDE 5 MG PO TABS: 5.0000 mg | ORAL_TABLET | Freq: Every evening | ORAL | 11 refills | Status: DC

## 2019-08-26 MED ORDER — FLUTICASONE PROPIONATE 50 MCG/ACT NA SUSP: 2.0000 | Freq: Every day | NASAL | 11 refills | Status: DC

## 2019-08-30 ENCOUNTER — Ambulatory Visit: Payer: Medicaid Other | Admitting: Family Medicine

## 2019-09-03 ENCOUNTER — Other Ambulatory Visit: Payer: Self-pay

## 2019-09-03 ENCOUNTER — Encounter: Payer: Self-pay | Admitting: Family Medicine

## 2019-09-03 ENCOUNTER — Ambulatory Visit: Payer: Medicaid Other | Admitting: Family Medicine

## 2019-09-03 VITALS — BP 128/82 | HR 93 | Temp 97.9°F | Resp 14 | Ht 67.0 in | Wt 269.7 lb

## 2019-09-03 DIAGNOSIS — H1013 Acute atopic conjunctivitis, bilateral: Secondary | ICD-10-CM | POA: Diagnosis not present

## 2019-09-03 DIAGNOSIS — J302 Other seasonal allergic rhinitis: Secondary | ICD-10-CM

## 2019-09-03 DIAGNOSIS — J45909 Unspecified asthma, uncomplicated: Secondary | ICD-10-CM

## 2019-09-03 DIAGNOSIS — K219 Gastro-esophageal reflux disease without esophagitis: Secondary | ICD-10-CM | POA: Diagnosis not present

## 2019-09-03 MED ORDER — PREDNISONE 20 MG PO TABS: ORAL_TABLET | ORAL | 0 refills | Status: DC

## 2019-09-03 MED ORDER — LEVOCETIRIZINE DIHYDROCHLORIDE 5 MG PO TABS: 5.0000 mg | ORAL_TABLET | Freq: Every evening | ORAL | 11 refills | Status: DC

## 2019-09-03 MED ORDER — FLUTICASONE PROPIONATE 50 MCG/ACT NA SUSP: 2.0000 | Freq: Every day | NASAL | 11 refills | Status: DC

## 2019-09-03 MED ORDER — CROMOLYN SODIUM 4 % OP SOLN: 1.0000 [drp] | Freq: Four times a day (QID) | OPHTHALMIC | 2 refills | Status: DC | PRN

## 2019-09-03 MED ORDER — OLOPATADINE HCL 0.2 % OP SOLN: 1.0000 [drp] | Freq: Every day | OPHTHALMIC | 2 refills | Status: DC

## 2019-09-03 NOTE — Progress Notes (Signed)
Name: Sherry Spence   MRN: [id number]    DOB: Dec 09, 1990   Date:09/03/2019       Progress Note  Chief Complaint  Patient presents with  . Follow-up  . Medication Refill  . Allergic Rhinitis   . Gastroesophageal Reflux     Subjective:   Sherry Spence is a 29 y.o. female, presents to clinic for routine follow up on the conditions listed above.  GERD - prior CP much better with ppi - has been taking protonix daily and starting to be able to take prn  AR with severe eye sx, patient typically has severe seasonal allergies she had recently requested refills on her allergy medication including eyedrops for itchy watery eyes, Singulair, antihistamines and nasal sprays.  On her chart she has multiple eyedrops in the past.  She currently is having itchy watery eyes sneezing, congestion, postnasal drip, scratchy throat, going outside can easily exacerbate her symptoms.  Her eyes are so itchy and swollen that she is having difficulty even wearing make-up.  Patient Active Problem List   Diagnosis Date Noted  . Microcytic anemia 01/14/2019  . Seasonal allergies 01/14/2019  . BMI 40.0-44.9, adult ([place]) 03/18/2016  . GERD (gastroesophageal reflux disease) 01/20/2014    Past Surgical History:  Procedure Laterality Date  . NO PAST SURGERIES      Family History  Problem Relation Age of Onset  . Hypertension Father   . Diabetes Paternal Aunt   . Cancer Maternal Grandmother   . Diabetes Maternal Grandmother   . Dementia Maternal Grandmother   . Diabetes Paternal Grandmother   . Hypertension Paternal Grandmother   . Asthma Mother   . Asthma Sister   . Cancer Paternal Grandfather   . Crohn's disease Sister   . Asthma Sister   . Mental retardation Neg Hx     Social History   Tobacco Use  . Smoking status: Never Smoker  . Smokeless tobacco: Never Used  Substance Use Topics  . Alcohol use: No  . Drug use: No      Current Outpatient Medications:  .  pantoprazole  (PROTONIX) 20 MG tablet, TAKE 1 TABLET (20 MG TOTAL) BY MOUTH EVERY MORNING. ONE HOUR BEFORE BREAKFAST, Disp: 30 tablet, Rfl: 1 .  Etonogestrel (NEXPLANON [place]), Inject into the skin., Disp: , Rfl:  .  fluticasone (FLONASE) 50 MCG/ACT nasal spray, Place 2 sprays into both nostrils daily., Disp: 16 g, Rfl: 11 .  levocetirizine (XYZAL) 5 MG tablet, Take 1 tablet (5 mg total) by mouth every evening., Disp: 30 tablet, Rfl: 11 .  montelukast (SINGULAIR) 10 MG tablet, Take 1 tablet (10 mg total) by mouth at bedtime. (Patient not taking: Reported on 09/03/2019), Disp: 30 tablet, Rfl: 11 .  Olopatadine HCl (PATADAY) 0.2 % SOLN, Apply 1 drop to eye daily., Disp: 2.5 mL, Rfl: 2  Allergies  Allergen Reactions  . Compazine [Prochlorperazine Edisylate] Anxiety    Chart Review Today: I personally reviewed active problem list, medication list, allergies, family history, social history, health maintenance, notes from last encounter, lab results, imaging with the patient/caregiver today.   Review of Systems  10 Systems reviewed and are negative for acute change except as noted in the HPI.  Objective:    Vitals:   09/03/19 0845  BP: 128/82  Pulse: 93  Resp: 14  Temp: 97.9 F (36.6 C)  SpO2: 98%  Weight: 269 lb 11.2 oz (122.3 kg)  Height: 5\' 7"  (1.702 m)    Body mass index  is 42.24 kg/m.  Physical Exam Vitals and nursing note reviewed.  Constitutional:      General: She is not in acute distress.    Appearance: Normal appearance. She is well-developed. She is obese. She is not ill-appearing, toxic-appearing or diaphoretic.  HENT:     Head: Normocephalic and atraumatic.     Jaw: There is normal jaw occlusion.     Right Ear: External ear normal.     Left Ear: External ear normal.     Nose: Mucosal edema, congestion and rhinorrhea present.     Right Turbinates: Enlarged. Not swollen or pale.     Left Turbinates: Enlarged.     Mouth/Throat:     Mouth: Mucous membranes are moist.     Pharynx:  Oropharynx is clear. Uvula midline. No pharyngeal swelling or oropharyngeal exudate.  Eyes:     General: Lids are everted, no foreign bodies appreciated. No allergic shiner.       Right eye: No discharge.        Left eye: No discharge.     Extraocular Movements: Extraocular movements intact.     Conjunctiva/sclera:     Right eye: Right conjunctiva is injected. No chemosis, exudate or hemorrhage.    Left eye: Left conjunctiva is injected. No chemosis, exudate or hemorrhage.    Comments: Mild bilateral upper eyelid edema without erythema, mild bilateral conjunctivitis without discharge  Neck:     Trachea: No tracheal deviation.  Cardiovascular:     Rate and Rhythm: Normal rate and regular rhythm.  Pulmonary:     Effort: Pulmonary effort is normal. No respiratory distress.     Breath sounds: No stridor.  Abdominal:     General: Bowel sounds are normal.     Palpations: Abdomen is soft.     Tenderness: There is no abdominal tenderness.  Musculoskeletal:        General: Normal range of motion.  Skin:    General: Skin is warm and dry.     Findings: No rash.  Neurological:     Mental Status: She is alert.     Motor: No abnormal muscle tone.     Coordination: Coordination normal.  Psychiatric:        Behavior: Behavior normal.       PHQ2/9: Depression screen [facility] 2/9 09/03/2019 07/30/2019 02/22/2019 01/15/2019 11/15/2018  Decreased Interest 0 0 0 0 0  Down, Depressed, Hopeless 0 0 0 0 0  PHQ - 2 Score 0 0 0 0 0  Altered sleeping 0 0 0 0 0  Tired, decreased energy 0 0 0 0 0  Change in appetite 0 0 0 0 0  Feeling bad or failure about yourself  0 0 0 0 0  Trouble concentrating 0 0 0 0 0  Moving slowly or fidgety/restless 0 0 0 0 0  Suicidal thoughts 0 0 0 0 0  PHQ-9 Score 0 0 0 0 0  Difficult doing work/chores - Not difficult at all Not difficult at all Not difficult at all Not difficult at all    phq 9 is neg, reviewed  Fall Risk: Fall Risk  09/03/2019 07/30/2019 02/22/2019 01/15/2019  11/15/2018  Falls in the past year? 0 0 0 0 1  Number falls in past yr: 0 0 0 0 0  Injury with Fall? 0 0 0 0 0  Follow up - - - Falls evaluation completed -    Functional Status Survey: Is the patient deaf or have difficulty hearing?: No Does the  patient have difficulty seeing, even when wearing glasses/contacts?: No Does the patient have difficulty concentrating, remembering, or making decisions?: No Does the patient have difficulty walking or climbing stairs?: No Does the patient have difficulty dressing or bathing?: No Does the patient have difficulty doing errands alone such as visiting a doctor's office or shopping?: No   Assessment & Plan:   1. Seasonal allergic rhinitis, unspecified trigger Severe symptoms over the past couple weeks refills on her allergy medications, does not appear to have a sinus infection, encouraged her to avoid triggers, take Xyzal and can add Claritin-D in the morning if needed use Flonase daily, she is not currently on Singulair but this is also an option if over-the-counter medications and these refills do not improve her symptoms. - Olopatadine HCl (PATADAY) 0.2 % SOLN; Apply 1 drop to eye daily.  Dispense: 2.5 mL; Refill: 2 - fluticasone (FLONASE) 50 MCG/ACT nasal spray; Place 2 sprays into both nostrils daily.  Dispense: 16 g; Refill: 11 - levocetirizine (XYZAL) 5 MG tablet; Take 1 tablet (5 mg total) by mouth every evening.  Dispense: 30 tablet; Refill: 11 - cromolyn (OPTICROM) 4 % ophthalmic solution; Place 1 drop into both eyes 4 (four) times daily as needed (for itchy, watery or swollen eye sx).  Dispense: 10 mL; Refill: 2  2. Allergic conjunctivitis of both eyes Severe eye symptoms with mild upper eyelid swelling encouraged her to apply ice, start and use antihistamines can use Benadryl additionally for severe symptoms, use Flonase which will also help with eye symptoms encouraged her to use over-the-counter Pataday or can get in try prescription  cromolyn drops for severe itchy eye symptoms.  Follow-up if any purulent discharge severe or sudden eye pain or redness or vision disturbances. - Olopatadine HCl (PATADAY) 0.2 % SOLN; Apply 1 drop to eye daily.  Dispense: 2.5 mL; Refill: 2 - fluticasone (FLONASE) 50 MCG/ACT nasal spray; Place 2 sprays into both nostrils daily.  Dispense: 16 g; Refill: 11 - levocetirizine (XYZAL) 5 MG tablet; Take 1 tablet (5 mg total) by mouth every evening.  Dispense: 30 tablet; Refill: 11 - cromolyn (OPTICROM) 4 % ophthalmic solution; Place 1 drop into both eyes 4 (four) times daily as needed (for itchy, watery or swollen eye sx).  Dispense: 10 mL; Refill: 2  3. Gastroesophageal reflux disease without esophagitis Patient is continue to take the Protonix which has led to significant improvement in her symptoms and she is currently trying to work on diet exercise lifestyle changes to help further improve GERD symptoms, encouraged her to wean off if able and additionally encouraged her to try Pepcid or Zantac as needed while trying to wean off PPI, oK to continue PPI daily if needed for symptoms  4. Reactive airway disease without complication, unspecified asthma severity, unspecified whether persistent Patient asked for steroids for her eye symptoms however I explained this is not indicated or standard of care.  She later endorsed some mild chest congestion coughing and wheeze with a history of reactive airway so I did give a steroid burst that is very brief to treat possible reactive airway/mild asthma exacerbation it encouraged her to try everything else prior to starting the steroids because of GI side effects which could flare up her GERD - predniSONE (DELTASONE) 20 MG tablet; 2 tabs poqday 1-3, 1 tabs poqday 4-6  Dispense: 9 tablet; Refill: 0    Delsa Grana, PA-C 09/03/19 9:28 AM

## 2019-09-03 NOTE — Patient Instructions (Signed)
I would try an antihistamine in the morning (like claritin-D) and xyzal and singulair at bedtime Do flonase (or other steroid nasal spray) 2 sprays to each nostril daily - doesn't matter what time of day Do your pataday eye drops in the morning  You can take benadryl on top of this for severe allergies - itching and sneezing Can take 12.5 -50 mg up to 4-6 times a day so the max dose is pretty high - I recommend lowest effective dose  You can try ice packs to your eyes and cromolyn drops throughout the day as needed for eye itching and swelling.     Allergic Conjunctivitis A clear membrane (conjunctiva) covers the white part of your eye and the inner surface of your eyelid. Allergic conjunctivitis happens when this membrane has inflammation. This is caused by allergies. Common causes of allergic reactions (allergens) include:  Outdoor allergens, such as: ? Pollen. ? Grass and weeds. ? Mold spores.  Indoor allergens, such as: ? Dust. ? Smoke. ? Mold. ? Pet dander. ? Animal hair. This condition can make your eye red or pink. It can also make your eye feel itchy. This condition cannot be spread from one person to another person (is not contagious). Follow these instructions at home:  Try not to be around things that you are allergic to.  Take or apply over-the-counter and prescription medicines only as told by your doctor. These include any eye drops.  Place a cool, clean washcloth on your eye for 10-20 minutes. Do this 3-4 times a day.  Do not touch or rub your eyes.  Do not wear contact lenses until the inflammation is gone. Wear glasses instead.  Do not wear eye makeup until the inflammation is gone.  Keep all follow-up visits as told by your doctor. This is important. Contact a doctor if:  Your symptoms get worse.  Your symptoms do not get better with treatment.  You have mild eye pain.  You are sensitive to light,  You have spots or blisters on your eyes.  You  have pus coming from your eye.  You have a fever. Get help right away if:  You have redness, swelling, or other symptoms in only one eye.  Your vision is blurry.  You have vision changes.  You have very bad eye pain. Summary  Allergic conjunctivitis is caused by allergies. It can make your eye red or pink, and it can make your eye feel itchy.  This condition cannot be spread from one person to another person (is not contagious).  Try not to be around things that you are allergic to.  Take or apply over-the-counter and prescription medicines only as told by your doctor. These include any eye drops.  Contact your doctor if your symptoms get worse or they do not get better with treatment. This information is not intended to replace advice given to you by your health care provider. Make sure you discuss any questions you have with your health care provider. Document Revised: 08/28/2018 Document Reviewed: 01/01/2016 Elsevier Patient Education  [place].

## 2019-09-04 ENCOUNTER — Encounter: Payer: Self-pay | Admitting: Family Medicine

## 2019-09-05 ENCOUNTER — Encounter: Payer: Self-pay | Admitting: Family Medicine

## 2019-09-10 ENCOUNTER — Encounter: Payer: Self-pay | Admitting: Family Medicine

## 2019-09-11 ENCOUNTER — Encounter: Payer: Self-pay | Admitting: Family Medicine

## 2019-09-11 IMAGING — US US MFM FETAL BPP W/O NON-STRESS
1 series · 14 of 28 positions shown · non-contrast
Comparison: none

[Series 1: us mfm fetal bpp w/o non-stress · 35 acquisitions, 14 frames shown]
[im 2/35]
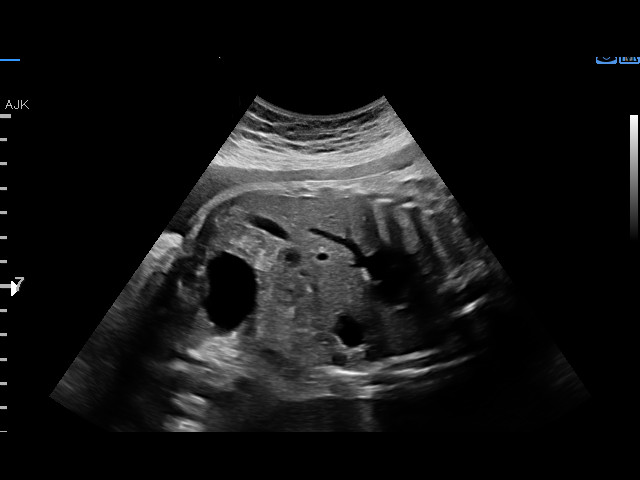
[im 4/35]
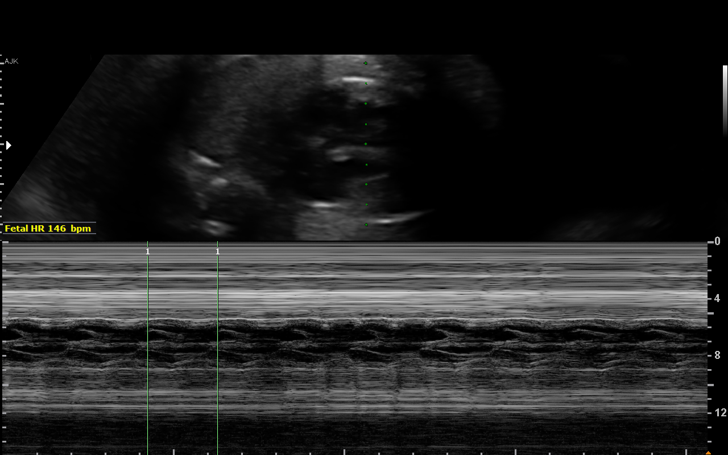
[im 7/35]
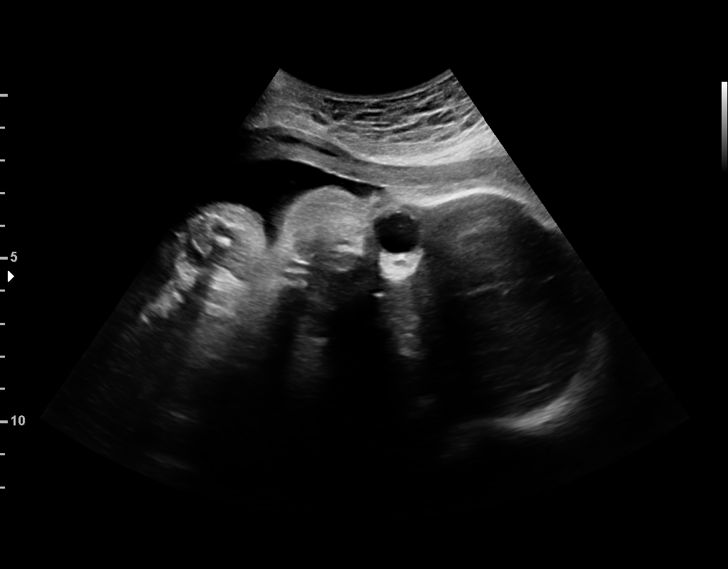
[im 9/35]
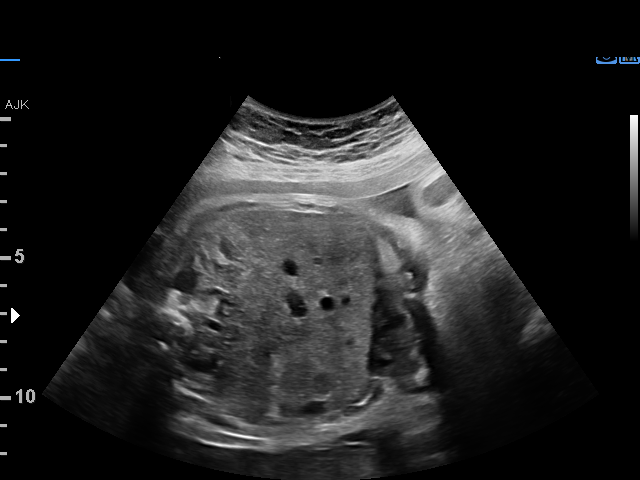
[im 12/35]
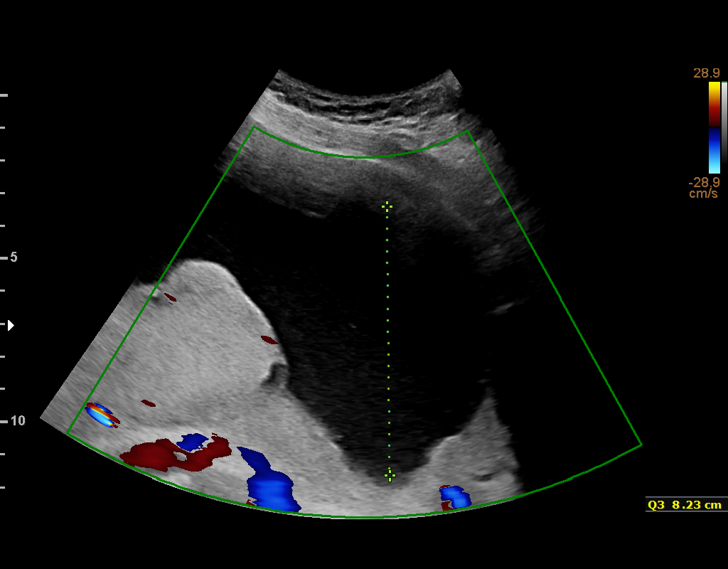
[im 14/35]
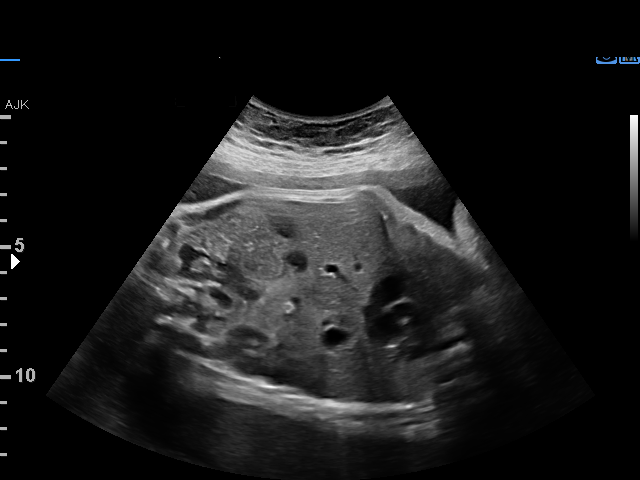
[im 17/35]
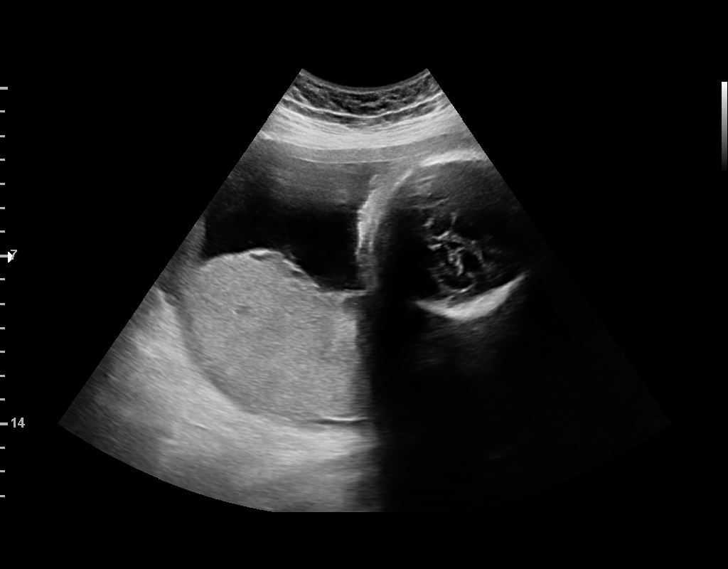
[im 19/35]
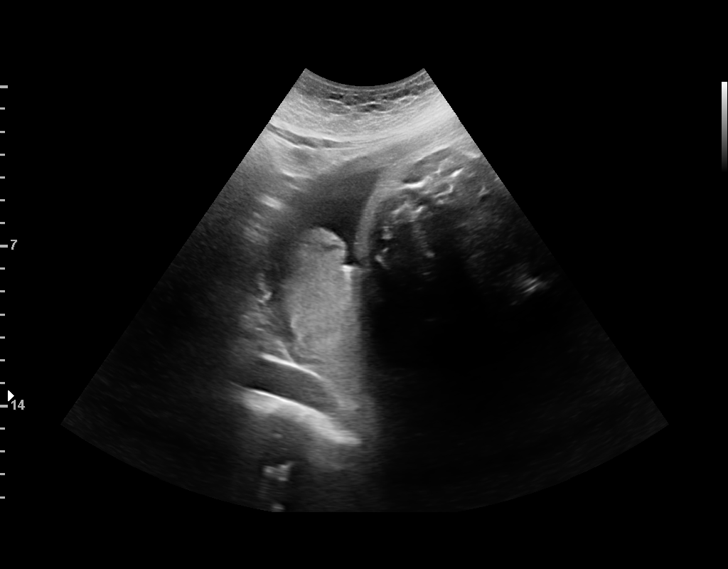
[im 22/35]
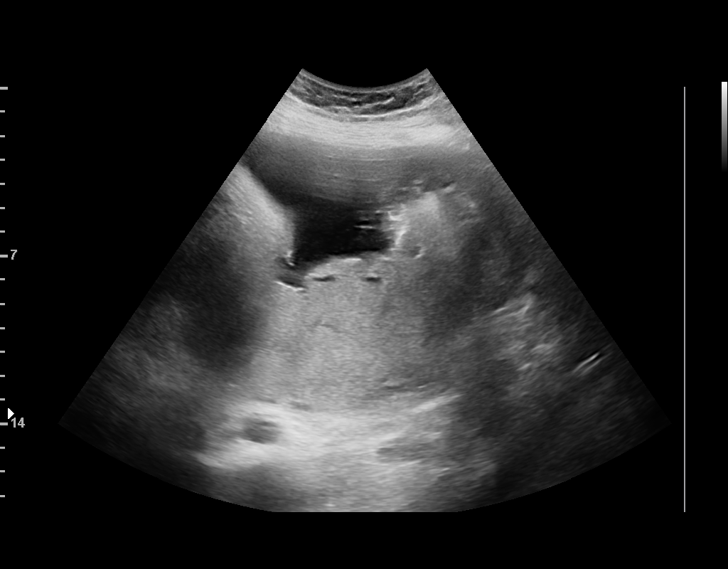
[im 24/35]
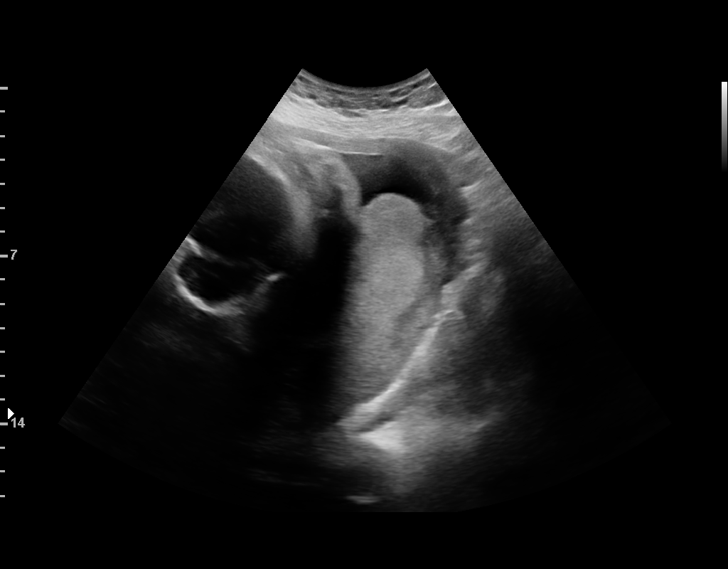
[im 27/35]
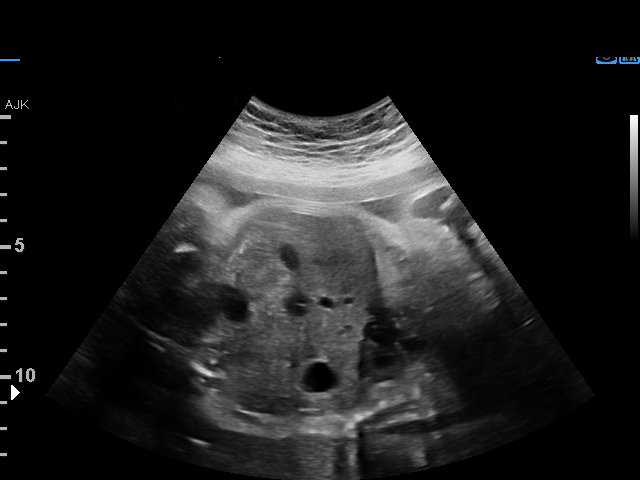
[im 29/35]
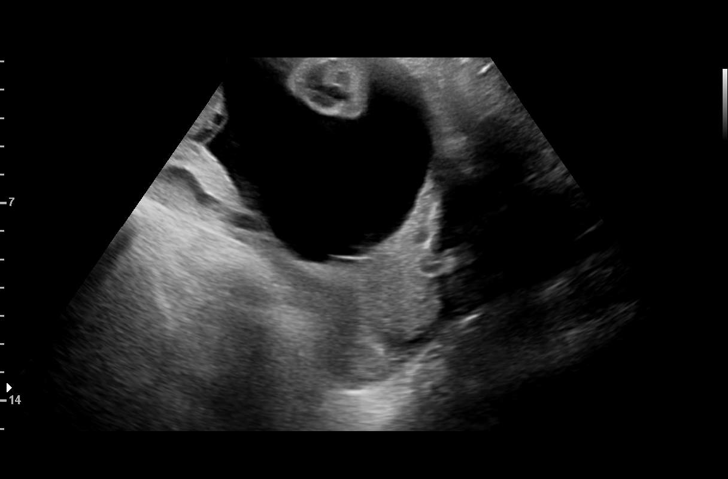
[im 32/35]
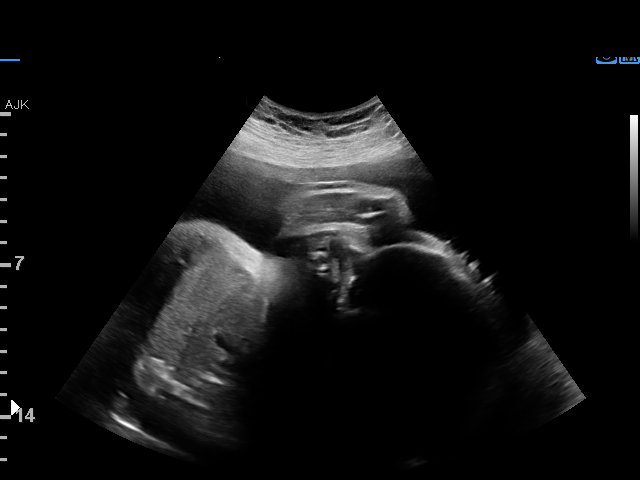
[im 35/35]
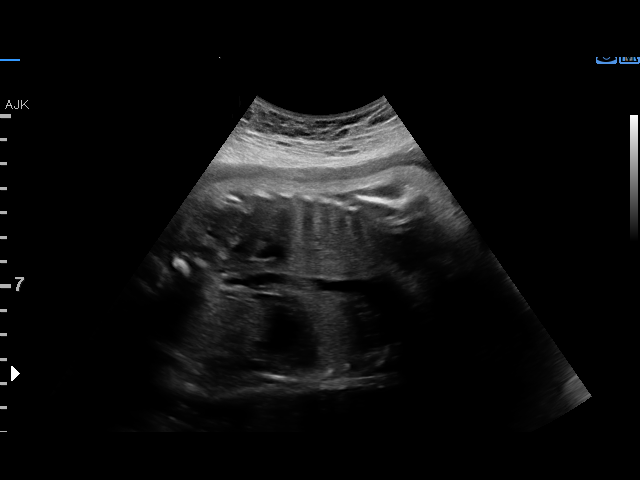

[14 of 28 positions shown; findings below may reference images not displayed]

1  MACHO TIGER            [id number]      [id number]     [id number]
Indications

32 weeks gestation of pregnancy
Obesity complicating pregnancy, third
trimester
Polyhydramnios, third trimester, antepartum
condition or complication, fetus 1
OB History

Blood Type:            Height:  5'6"   Weight (lb):  262       BMI:
Gravidity:    3         Term:   2
Living:       2
Fetal Evaluation

Num Of Fetuses:     1
Fetal Heart         146
Rate(bpm):
Cardiac Activity:   Observed
Presentation:       Variable
Placenta:           Posterior, above cervical os
P. Cord Insertion:  Visualized, central

Amniotic Fluid
AFI FV:      Mild Polyhydramnios

AFI Sum(cm)     %Tile       Largest Pocket(cm)
24.14           95

RUQ(cm)       RLQ(cm)       LUQ(cm)        LLQ(cm)
3.73
Biophysical Evaluation

Amniotic F.V:   Polyhydramnios             F. Tone:        Observed
F. Movement:    Observed                   Score:          [DATE]
F. Breathing:   Observed
Gestational Age

Best:          32w 1d     Det. By:  Early Ultrasound         EDD:   07/02/17
(11/07/16)
Impression

IUP at 32+1 with polyhydramnios, here for BPP
Amniotic fluid levels borderline with AFI 24.1 cm and MVP
cm
BPP [DATE]
Recommendations

Repeat BPP in 1 week

## 2019-09-22 IMAGING — US US MFM FETAL BPP W/O NON-STRESS
1 series · 12 of 14 positions shown · non-contrast
Comparison: none

[Series 1: us mfm fetal bpp w/o non-stress · 14 acquisitions, 12 frames shown]
[im 1/14]
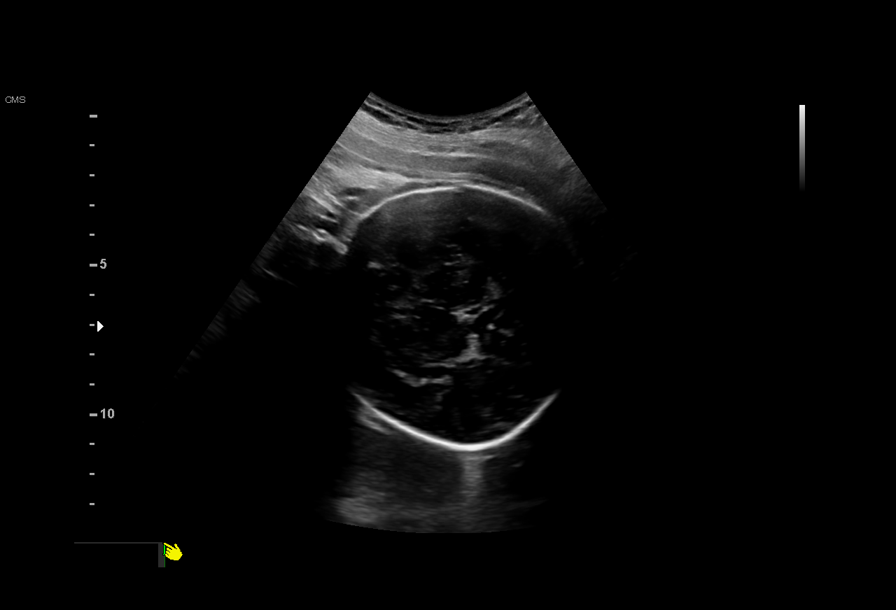
[im 2/14]
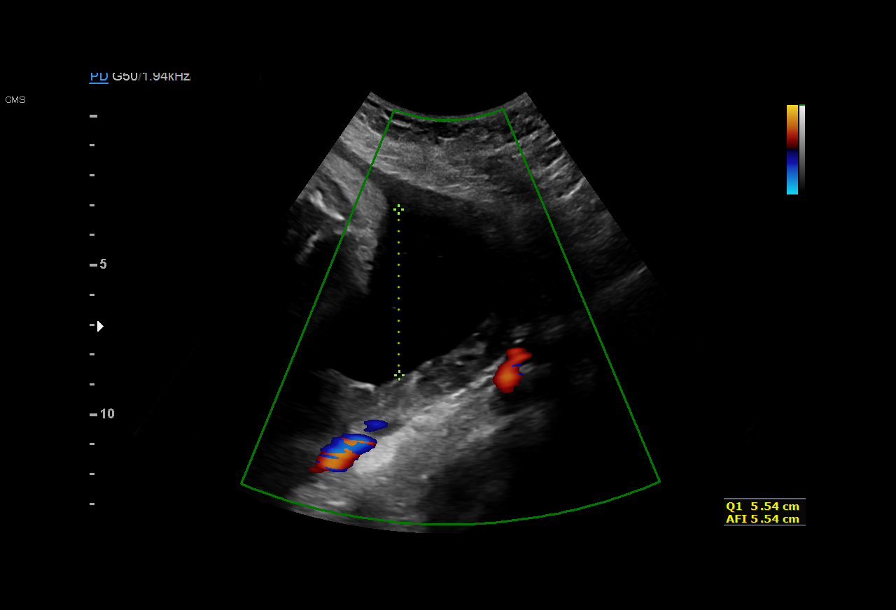
[im 3/14]
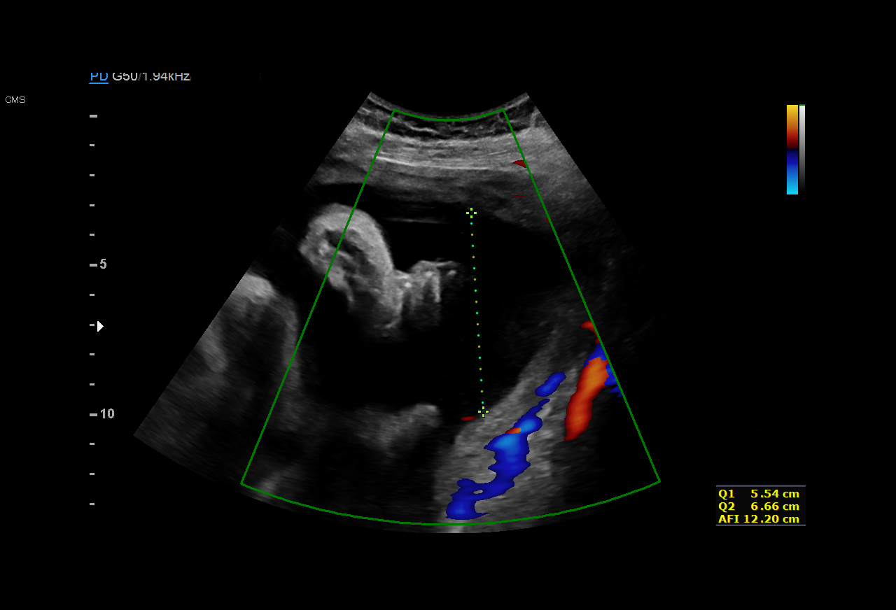
[im 5/14]
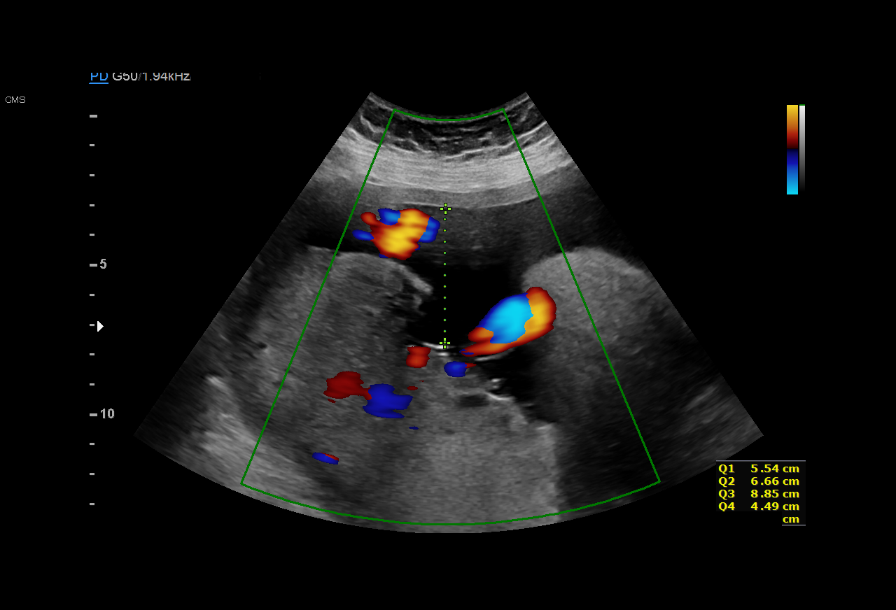
[im 6/14]
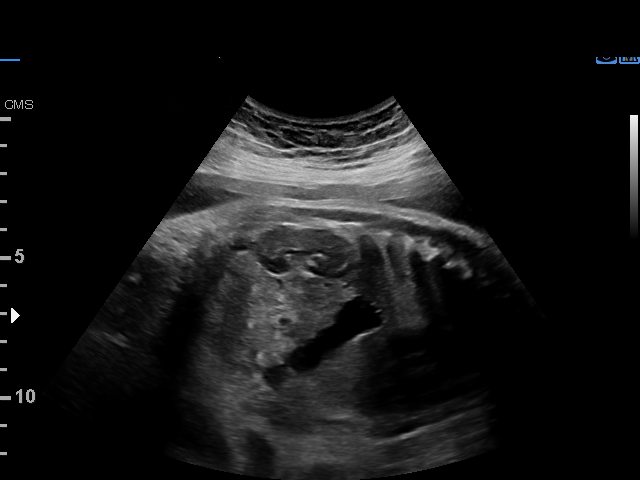
[im 7/14]
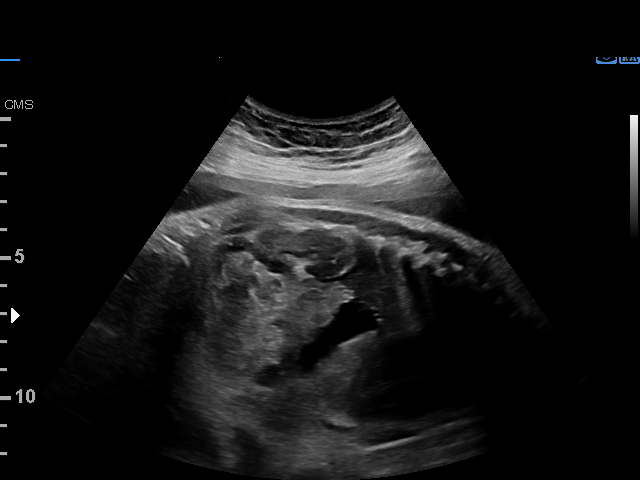
[im 8/14]
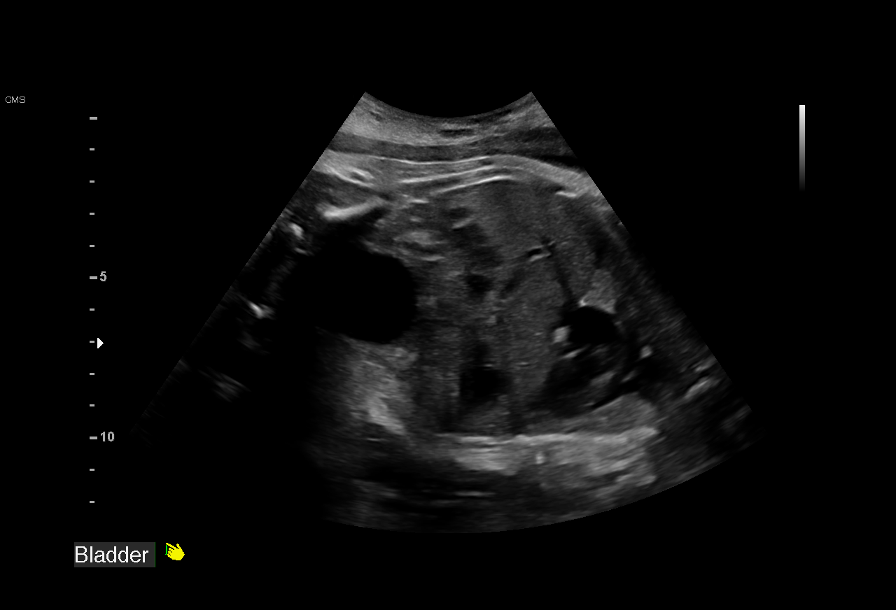
[im 9/14]
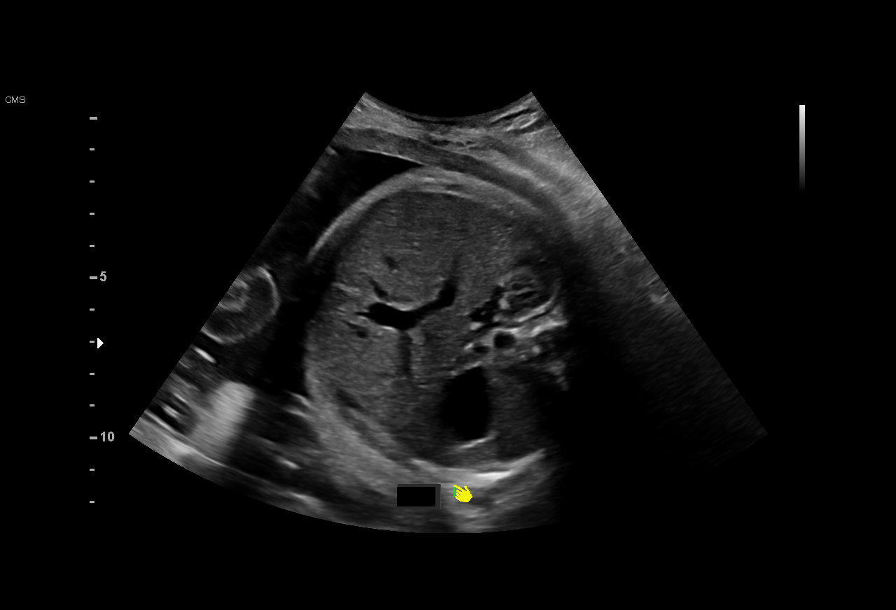
[im 10/14]
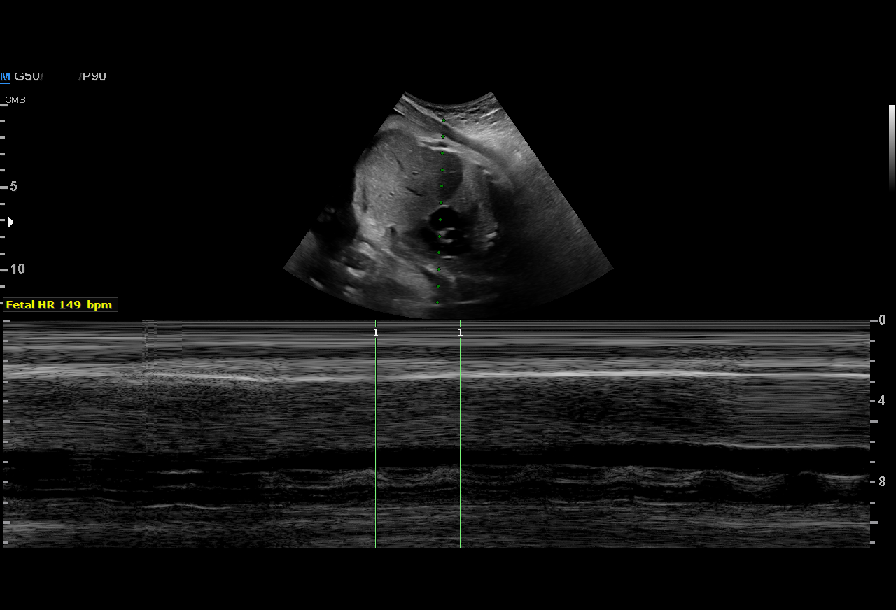
[im 12/14]
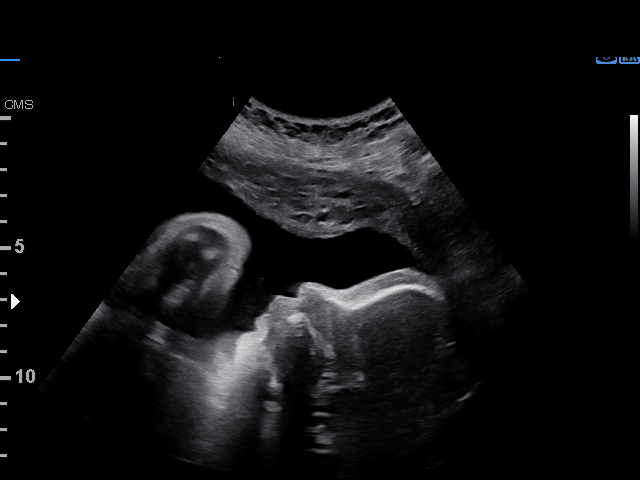
[im 13/14]
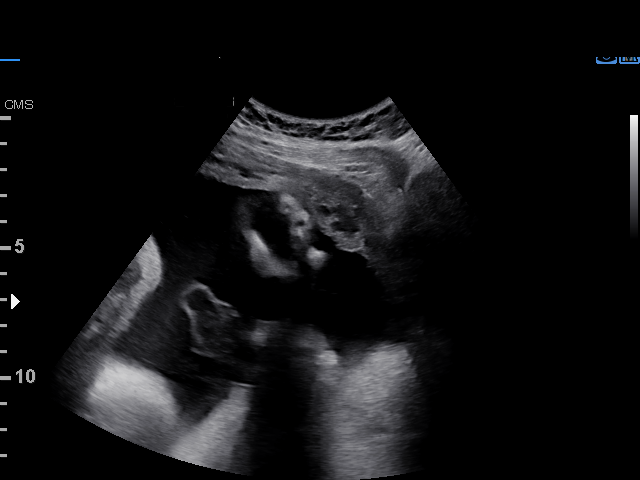
[im 14/14]
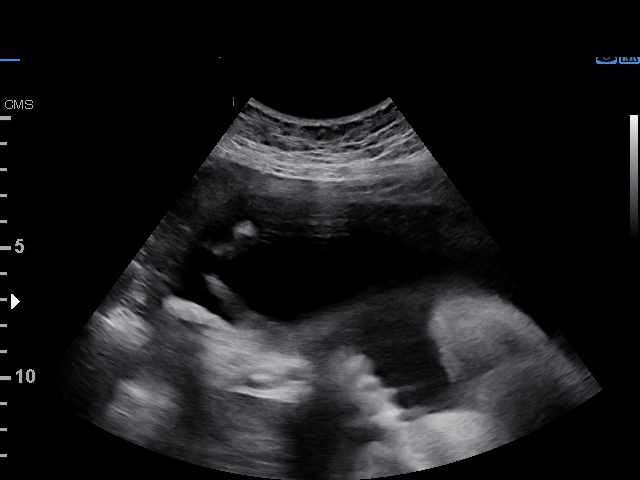

[12 of 14 positions shown; findings below may reference images not displayed]

1  DEIMINTAS KALENDRIENE            [id number]      [id number]     [id number]
Indications

33 weeks gestation of pregnancy
Obesity complicating pregnancy, third
trimester
Polyhydramnios, third trimester, antepartum
condition or complication, unspecified fetus
OB History

Blood Type:            Height:  5'6"   Weight (lb):  262       BMI:
Gravidity:    3         Term:   2
Living:       2
Fetal Evaluation

Num Of Fetuses:     1
Fetal Heart         149
Rate(bpm):
Cardiac Activity:   Observed
Presentation:       Cephalic

Amniotic Fluid
AFI FV:      Polyhydramnios

AFI Sum(cm)     %Tile       Largest Pocket(cm)
25.54           96

RUQ(cm)       RLQ(cm)       LUQ(cm)        LLQ(cm)
5.54
Biophysical Evaluation
Amniotic F.V:   Within normal limits       F. Tone:        Observed
F. Movement:    Observed                   Score:          [DATE]
F. Breathing:   Observed
Gestational Age

Best:          33w 5d     Det. By:  Early Ultrasound         EDD:   07/02/17
(11/07/16)
Impression

SIUP at 44w5d
active fetus
BPP [DATE]
AFI is consistent with polyhydramnios
Recommendations

Continue antenatal testing.

## 2019-09-25 ENCOUNTER — Other Ambulatory Visit: Payer: Self-pay | Admitting: Family Medicine

## 2019-09-25 DIAGNOSIS — R0789 Other chest pain: Secondary | ICD-10-CM

## 2019-10-06 IMAGING — US US MFM UA CORD DOPPLER
1 series · 12 of 24 positions shown · non-contrast
Comparison: none

[Series 1: us mfm ua cord doppler · 24 acquisitions, 12 frames shown]
[im 2/24]
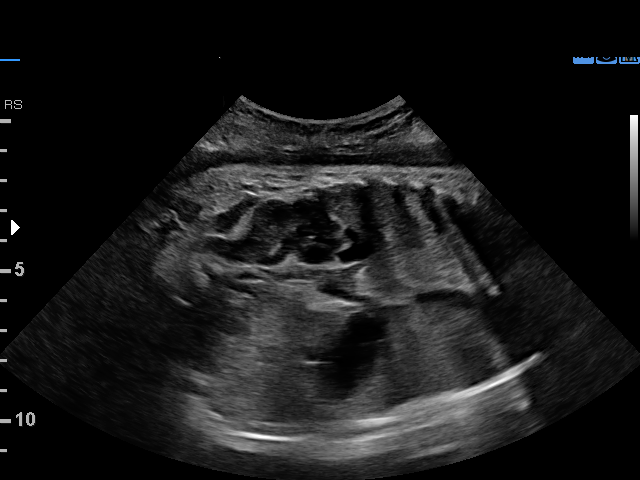
[im 4/24]
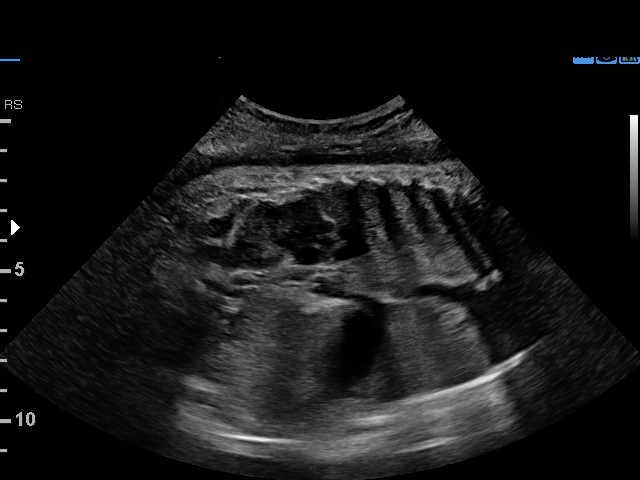
[im 6/24]
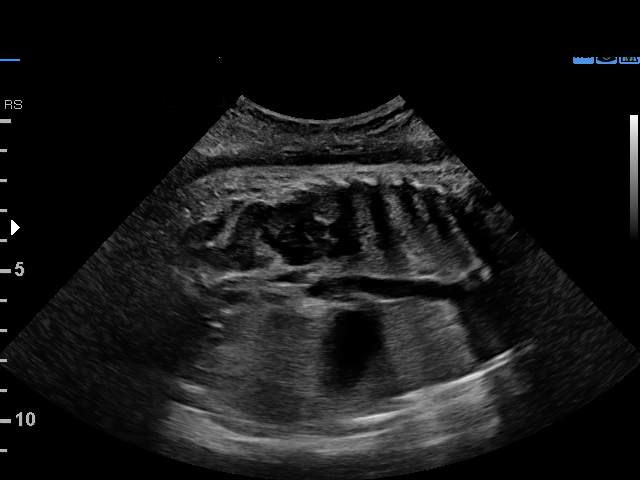
[im 8/24]
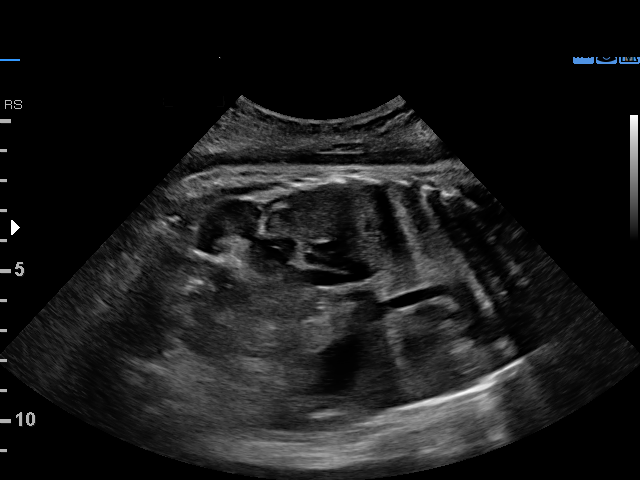
[im 10/24]
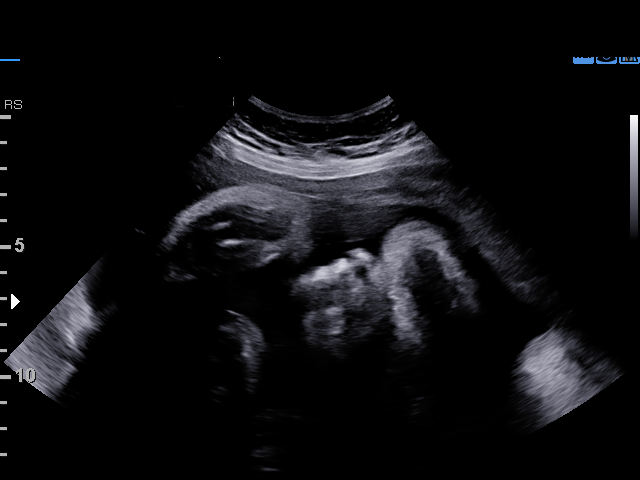
[im 12/24]
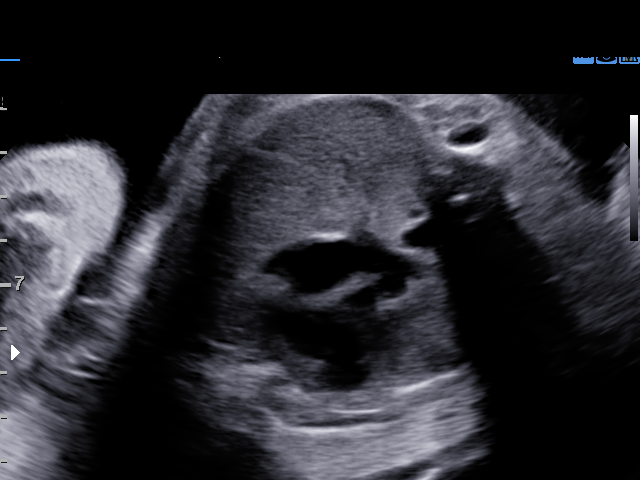
[im 14/24]
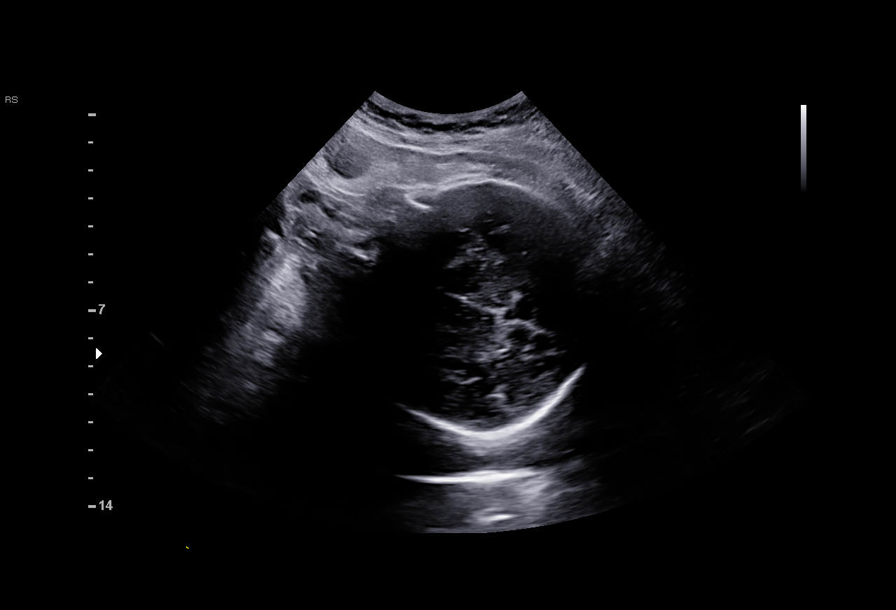
[im 16/24]
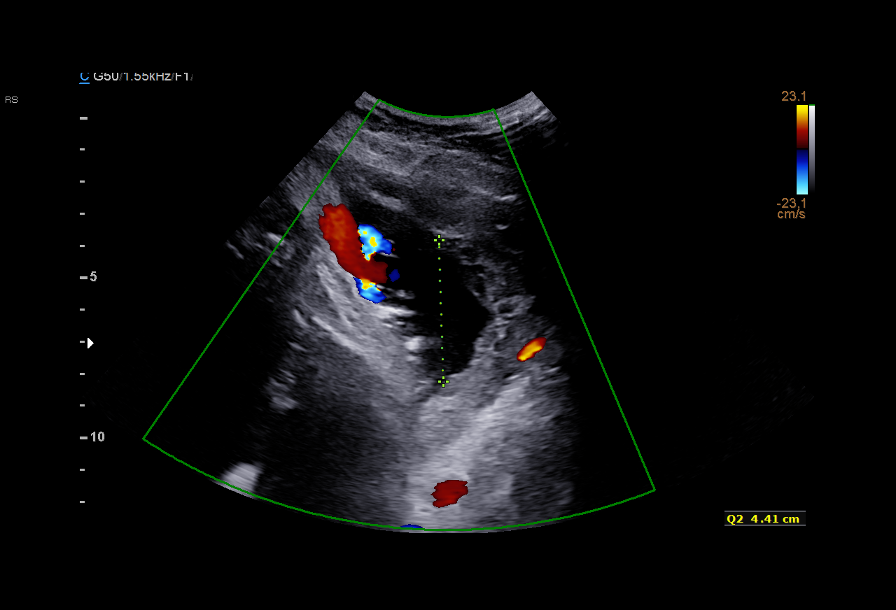
[im 18/24]
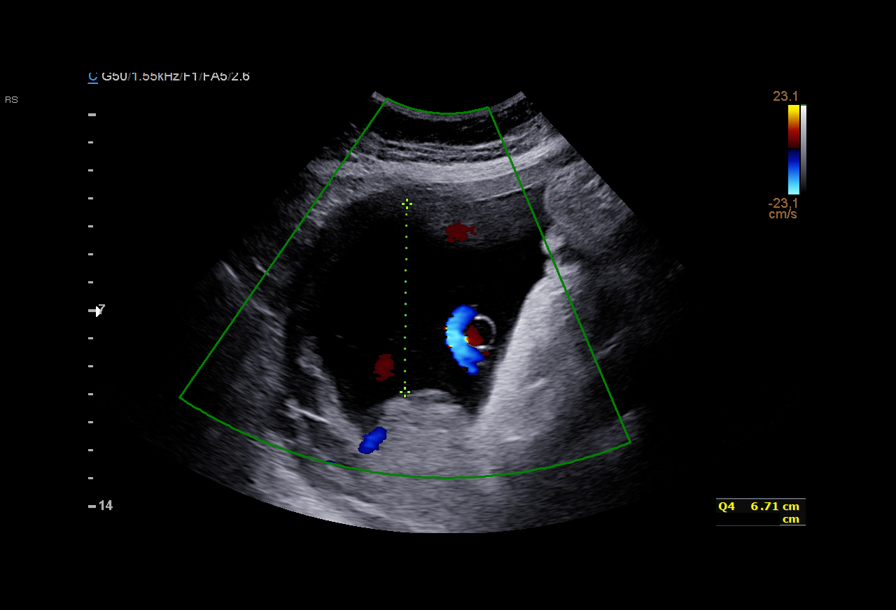
[im 20/24]
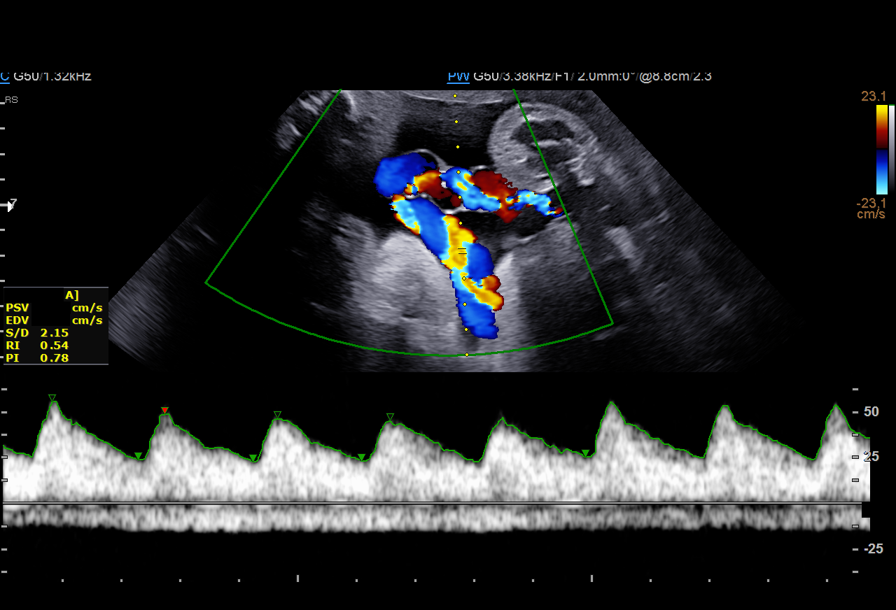
[im 22/24]
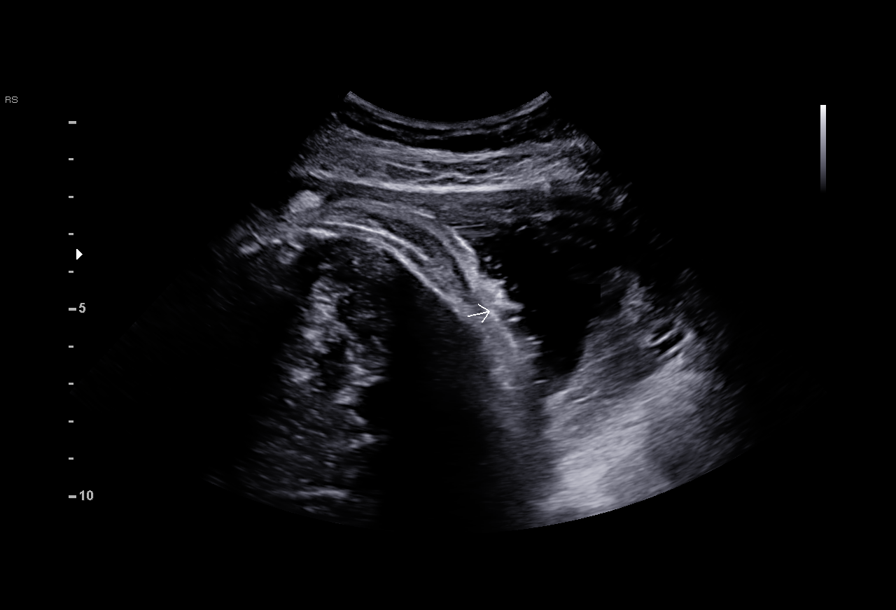
[im 24/24]
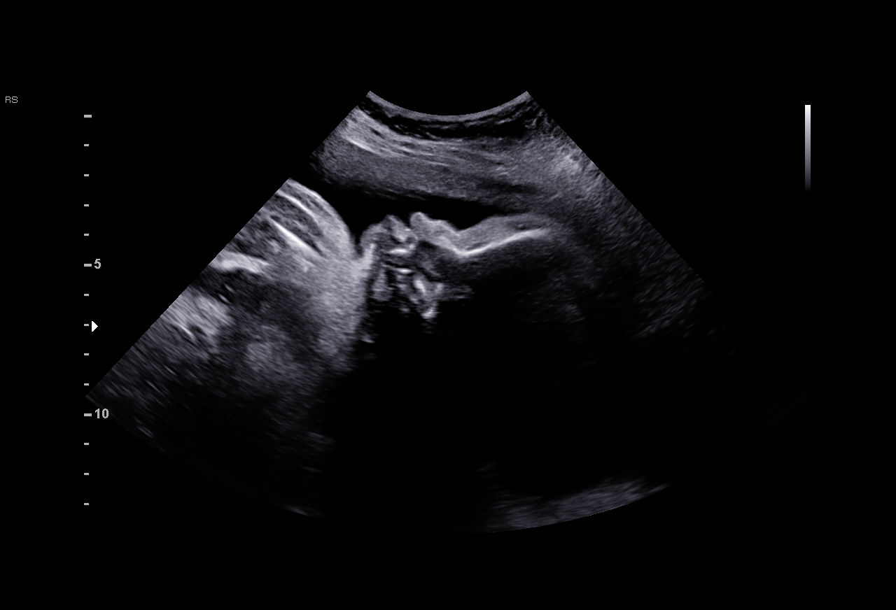

[12 of 24 positions shown; findings below may reference images not displayed]

1  FINELLA PASIN              [id number]      [id number]     [id number]
2  FINELLA PASIN              [id number]      [id number]     [id number]
Indications

35 weeks gestation of pregnancy
Polyhydramnios, third trimester, antepartum
condition or complication, unspecified fetus
Obesity complicating pregnancy, third
trimester
OB History

Blood Type:            Height:  5'6"   Weight (lb):  262       BMI:
Gravidity:    3         Term:   2
Living:       2
Fetal Evaluation

Num Of Fetuses:     1
Fetal Heart         161
Rate(bpm):
Cardiac Activity:   Observed
Presentation:       Cephalic

Amniotic Fluid
AFI FV:      Subjectively within normal limits

AFI Sum(cm)     %Tile       Largest Pocket(cm)
23.15           88

RUQ(cm)       RLQ(cm)       LUQ(cm)        LLQ(cm)
4.62
Biophysical Evaluation

Amniotic F.V:   Pocket => 2 cm two         F. Tone:         Observed
planes
F. Movement:    Observed                   Score:           [DATE]
F. Breathing:   Observed
Gestational Age

Best:          35w 5d     Det. By:  Early Ultrasound         EDD:    07/02/17
(11/07/16)
Doppler - Fetal Vessels

Umbilical Artery
S/D     %tile     RI              PI               PSV   ADFV    RDFV
(cm/s)
2.16        33   0.54            0.78             52.53      No       No

Impression

SIUP at 35+5 weeks
High normal amniotic fluid volume
BPP [DATE]
UA dopplers were normal for this GA
Recommendations

If AFV is within normal limits next week, can discontinue
testing

## 2019-10-07 ENCOUNTER — Encounter: Payer: Self-pay | Admitting: Family Medicine

## 2019-10-07 ENCOUNTER — Ambulatory Visit: Payer: BC Managed Care – PPO | Admitting: Family Medicine

## 2019-10-07 ENCOUNTER — Other Ambulatory Visit: Payer: Self-pay

## 2019-10-07 VITALS — BP 124/78 | HR 80 | Temp 97.2°F | Resp 16 | Ht 67.0 in | Wt 271.1 lb

## 2019-10-07 DIAGNOSIS — J302 Other seasonal allergic rhinitis: Secondary | ICD-10-CM

## 2019-10-07 DIAGNOSIS — H9192 Unspecified hearing loss, left ear: Secondary | ICD-10-CM

## 2019-10-07 DIAGNOSIS — K219 Gastro-esophageal reflux disease without esophagitis: Secondary | ICD-10-CM

## 2019-10-07 DIAGNOSIS — Z6841 Body Mass Index (BMI) 40.0 and over, adult: Secondary | ICD-10-CM | POA: Diagnosis not present

## 2019-10-07 DIAGNOSIS — H9209 Otalgia, unspecified ear: Secondary | ICD-10-CM | POA: Diagnosis not present

## 2019-10-07 DIAGNOSIS — H6982 Other specified disorders of Eustachian tube, left ear: Secondary | ICD-10-CM

## 2019-10-07 MED ORDER — PHENTERMINE HCL 37.5 MG PO TABS: 37.5000 mg | ORAL_TABLET | Freq: Every day | ORAL | 0 refills | Status: DC

## 2019-10-07 MED ORDER — CETIRIZINE HCL 10 MG PO TABS: 10.0000 mg | ORAL_TABLET | Freq: Every day | ORAL | 11 refills | Status: DC

## 2019-10-07 NOTE — Patient Instructions (Signed)
Eustachian Tube Dysfunction ° °Eustachian tube dysfunction refers to a condition in which a blockage develops in the narrow passage that connects the middle ear to the back of the nose (eustachian tube). The eustachian tube regulates air pressure in the middle ear by letting air move between the ear and nose. It also helps to drain fluid from the middle ear space. °Eustachian tube dysfunction can affect one or both ears. When the eustachian tube does not function properly, air pressure, fluid, or both can build up in the middle ear. °What are the causes? °This condition occurs when the eustachian tube becomes blocked or cannot open normally. Common causes of this condition include: °· Ear infections. °· Colds and other infections that affect the nose, mouth, and throat (upper respiratory tract). °· Allergies. °· Irritation from cigarette smoke. °· Irritation from stomach acid coming up into the esophagus (gastroesophageal reflux). The esophagus is the tube that carries food from the mouth to the stomach. °· Sudden changes in air pressure, such as from descending in an airplane or scuba diving. °· Abnormal growths in the nose or throat, such as: °? Growths that line the nose (nasal polyps). °? Abnormal growth of cells (tumors). °? Enlarged tissue at the back of the throat (adenoids). °What increases the risk? °You are more likely to develop this condition if: °· You smoke. °· You are overweight. °· You are a child who has: °? Certain birth defects of the mouth, such as cleft palate. °? Large tonsils or adenoids. °What are the signs or symptoms? °Common symptoms of this condition include: °· A feeling of fullness in the ear. °· Ear pain. °· Clicking or popping noises in the ear. °· Ringing in the ear. °· Hearing loss. °· Loss of balance. °· Dizziness. °Symptoms may get worse when the air pressure around you changes, such as when you travel to an area of high elevation, fly on an airplane, or go scuba diving. °How is  this diagnosed? °This condition may be diagnosed based on: °· Your symptoms. °· A physical exam of your ears, nose, and throat. °· Tests, such as those that measure: °? The movement of your eardrum (tympanogram). °? Your hearing (audiometry). °How is this treated? °Treatment depends on the cause and severity of your condition. °· In mild cases, you may relieve your symptoms by moving air into your ears. This is called "popping the ears." °· In more severe cases, or if you have symptoms of fluid in your ears, treatment may include: °? Medicines to relieve congestion (decongestants). °? Medicines that treat allergies (antihistamines). °? Nasal sprays or ear drops that contain medicines that reduce swelling (steroids). °? A procedure to drain the fluid in your eardrum (myringotomy). In this procedure, a small tube is placed in the eardrum to: °§ Drain the fluid. °§ Restore the air in the middle ear space. °? A procedure to insert a balloon device through the nose to inflate the opening of the eustachian tube (balloon dilation). °Follow these instructions at home: °Lifestyle °· Do not do any of the following until your health care provider approves: °? Travel to high altitudes. °? Fly in airplanes. °? Work in a pressurized cabin or room. °? Scuba dive. °· Do not use any products that contain nicotine or tobacco, such as cigarettes and e-cigarettes. If you need help quitting, ask your health care provider. °· Keep your ears dry. Wear fitted earplugs during showering and bathing. Dry your ears completely after. °General instructions °· Take over-the-counter   and prescription medicines only as told by your health care provider. °· Use techniques to help pop your ears as recommended by your health care provider. These may include: °? Chewing gum. °? Yawning. °? Frequent, forceful swallowing. °? Closing your mouth, holding your nose closed, and gently blowing as if you are trying to blow air out of your nose. °· Keep all  follow-up visits as told by your health care provider. This is important. °Contact a health care provider if: °· Your symptoms do not go away after treatment. °· Your symptoms come back after treatment. °· You are unable to pop your ears. °· You have: °? A fever. °? Pain in your ear. °? Pain in your head or neck. °? Fluid draining from your ear. °· Your hearing suddenly changes. °· You become very dizzy. °· You lose your balance. °Summary °· Eustachian tube dysfunction refers to a condition in which a blockage develops in the eustachian tube. °· It can be caused by ear infections, allergies, inhaled irritants, or abnormal growths in the nose or throat. °· Symptoms include ear pain, hearing loss, or ringing in the ears. °· Mild cases are treated with maneuvers to unblock the ears, such as yawning or ear popping. °· Severe cases are treated with medicines. Surgery may also be done (rare). °This information is not intended to replace advice given to you by your health care provider. Make sure you discuss any questions you have with your health care provider. °Document Revised: 08/29/2017 Document Reviewed: 08/29/2017 °Elsevier Patient Education © 2020 [employer] ° °

## 2019-10-07 NOTE — Progress Notes (Signed)
Patient ID: Sherry Spence, female    DOB: 10-04-1990, 29 y.o.   MRN: [id number]  PCP: Delsa Grana, PA-C  Chief Complaint  Patient presents with  . Allergies    medication refills  . Otalgia    buzzing sound in ears  . Obesity    discuss medication    Subjective:   Sherry Spence is a 29 y.o. female, presents to clinic with CC of the following:  HPI  Nasal and seasonal allergies - pt request med refills She was previously prescribed Xyzal but she requests refill on Zyrtec which she feels works better for her.  She has not been taking Singulair.  Her eye allergies did improve with using allergy eyedrops She has been using her Flonase but stopped antihistamines  She is having buzzing in her ears, reports it feels like a whooshing sound.  Sometimes he feels like the hearing in her left ear is blocked or she will have intermittent brief pain that comes and goes, no swelling redness or tenderness to her outer ear, no drainage.  She has not had any facial pressure or pain, headaches, sore throat, fever sweats chills.   Obesity/weight loss -  She is concerned with weight gain weight is up about 2 pounds She has been on phentermine in the past however that was about 9 months ago she would like to restart it and see if she is able to lose weight again.  She tolerated phentermine in the past without any chest pain, rapid heart rate, anxiety, mood changes. Wt Readings from Last 5 Encounters:  10/07/19 271 lb 1.6 oz (123 kg)  09/03/19 269 lb 11.2 oz (122.3 kg)  07/30/19 265 lb 14.4 oz (120.6 kg)  03/23/19 270 lb (122.5 kg)  01/15/19 269 lb 11.2 oz (122.3 kg)   BMI Readings from Last 5 Encounters:  10/07/19 42.46 kg/m  09/03/19 42.24 kg/m  07/30/19 41.65 kg/m  03/23/19 43.58 kg/m  01/15/19 42.24 kg/m      Patient Active Problem List   Diagnosis Date Noted  . Microcytic anemia 01/14/2019  . Seasonal allergic rhinitis 01/14/2019  . BMI 40.0-44.9, adult ([place])  03/18/2016  . Gastroesophageal reflux disease without esophagitis 01/20/2014      Current Outpatient Medications:  .  cromolyn (OPTICROM) 4 % ophthalmic solution, Place 1 drop into both eyes 4 (four) times daily as needed (for itchy, watery or swollen eye sx)., Disp: 10 mL, Rfl: 2 .  Etonogestrel (NEXPLANON [place]), Inject into the skin., Disp: , Rfl:  .  fluticasone (FLONASE) 50 MCG/ACT nasal spray, Place 2 sprays into both nostrils daily., Disp: 16 g, Rfl: 11 .  levocetirizine (XYZAL) 5 MG tablet, Take 1 tablet (5 mg total) by mouth every evening., Disp: 30 tablet, Rfl: 11 .  Olopatadine HCl (PATADAY) 0.2 % SOLN, Apply 1 drop to eye daily., Disp: 2.5 mL, Rfl: 2 .  pantoprazole (PROTONIX) 20 MG tablet, TAKE 1 TABLET (20 MG TOTAL) BY MOUTH EVERY MORNING. ONE HOUR BEFORE BREAKFAST, Disp: 30 tablet, Rfl: 1 .  montelukast (SINGULAIR) 10 MG tablet, Take 1 tablet (10 mg total) by mouth at bedtime. (Patient not taking: Reported on 10/07/2019), Disp: 30 tablet, Rfl: 11 .  predniSONE (DELTASONE) 20 MG tablet, 2 tabs poqday 1-3, 1 tabs poqday 4-6 (Patient not taking: Reported on 10/07/2019), Disp: 9 tablet, Rfl: 0   Allergies  Allergen Reactions  . Compazine [Prochlorperazine Edisylate] Anxiety     Family History  Problem Relation Age of Onset  .  Hypertension Father   . Diabetes Paternal Aunt   . Cancer Maternal Grandmother   . Diabetes Maternal Grandmother   . Dementia Maternal Grandmother   . Diabetes Paternal Grandmother   . Hypertension Paternal Grandmother   . Asthma Mother   . Asthma Sister   . Cancer Paternal Grandfather   . Crohn's disease Sister   . Asthma Sister   . Mental retardation Neg Hx      Social History   Socioeconomic History  . Marital status: Significant Other    Spouse name: Not on file  . Number of children: 3  . Years of education: 73  . Highest education level: Associate degree: occupational, Hotel manager, or vocational program  Occupational History  . Not on  file  Tobacco Use  . Smoking status: Never Smoker  . Smokeless tobacco: Never Used  Substance and Sexual Activity  . Alcohol use: No  . Drug use: No  . Sexual activity: Yes    Birth control/protection: Implant  Other Topics Concern  . Not on file  Social History Narrative   Getting Married Sept 20th (? Or close to that) 2020 with fiance of several years   Three children ages 57, 26 and [place]   Social Determinants of Health   Financial Resource Strain: Low Risk   . Difficulty of Paying Living Expenses: Not hard at all  Food Insecurity: No Food Insecurity  . Worried About Charity fundraiser in the Last Year: Never true  . Ran Out of Food in the Last Year: Never true  Transportation Needs: No Transportation Needs  . Lack of Transportation (Medical): No  . Lack of Transportation (Non-Medical): No  Physical Activity: Insufficiently Active  . Days of Exercise per Week: 1 day  . Minutes of Exercise per Session: 20 min  Stress: No Stress Concern Present  . Feeling of Stress : Not at all  Social Connections: Unknown  . Frequency of Communication with Friends and Family: Once a week  . Frequency of Social Gatherings with Friends and Family: Once a week  . Attends Religious Services: Never  . Active Member of Clubs or Organizations: No  . Attends Archivist Meetings: Never  . Marital Status: Not on file  Intimate Partner Violence: Not At Risk  . Fear of Current or Ex-Partner: No  . Emotionally Abused: No  . Physically Abused: No  . Sexually Abused: No    Chart Review Today: I personally reviewed active problem list, medication list, allergies, family history, social history, health maintenance, notes from last encounter, lab results, imaging with the patient/caregiver today.   Review of Systems 10 Systems reviewed and are negative for acute change except as noted in the HPI.     Objective:   Vitals:    10/07/19 0957  BP: 124/78  Pulse: 100  Resp: 16  Temp: (!) 97.2 F (36.2 C)  TempSrc: Temporal  SpO2: 98%  Weight: 271 lb 1.6 oz (123 kg)  Height: 5\' 7"  (1.702 m)    Body mass index is 42.46 kg/m.  Physical Exam Vitals and nursing note reviewed.  Constitutional:      General: She is not in acute distress.    Appearance: She is well-developed. She is not ill-appearing, toxic-appearing or diaphoretic.  HENT:     Head: Normocephalic and atraumatic.     Right Ear: Hearing, tympanic membrane, ear canal and external ear normal.  Left Ear: Hearing, tympanic membrane, ear canal and external ear normal.     Nose: Mucosal edema present.     Right Turbinates: Enlarged and swollen.     Left Turbinates: Enlarged and swollen.     Right Sinus: No maxillary sinus tenderness or frontal sinus tenderness.     Left Sinus: No maxillary sinus tenderness or frontal sinus tenderness.     Mouth/Throat:     Mouth: Mucous membranes are moist. Mucous membranes are not pale.     Pharynx: Oropharynx is clear. Uvula midline. No oropharyngeal exudate or uvula swelling.     Tonsils: No tonsillar abscesses.  Eyes:     General:        Right eye: No discharge.        Left eye: No discharge.     Conjunctiva/sclera: Conjunctivae normal.     Pupils: Pupils are equal, round, and reactive to light.  Neck:     Trachea: No tracheal deviation.  Cardiovascular:     Rate and Rhythm: Normal rate and regular rhythm.     Heart sounds: Normal heart sounds.  Pulmonary:     Effort: Pulmonary effort is normal. No respiratory distress.     Breath sounds: No stridor. No wheezing, rhonchi or rales.  Abdominal:     General: Bowel sounds are normal. There is no distension.     Palpations: Abdomen is soft.  Musculoskeletal:        General: Normal range of motion.     Cervical back: Normal range of motion and neck supple.  Skin:    General: Skin is warm and dry.     Coloration: Skin is not pale.     Findings: No rash.   Neurological:     Mental Status: She is alert.     Motor: No abnormal muscle tone.     Coordination: Coordination normal.  Psychiatric:        Behavior: Behavior normal.          Results for orders placed or performed during the hospital encounter of 03/23/19  Lipase, blood  Result Value Ref Range   Lipase 47 11 - 51 U/L  Comprehensive metabolic panel  Result Value Ref Range   Sodium 141 135 - 145 mmol/L   Potassium 3.8 3.5 - 5.1 mmol/L   Chloride 109 98 - 111 mmol/L   CO2 23 22 - 32 mmol/L   Glucose, Bld 118 (H) 70 - 99 mg/dL   BUN 14 6 - 20 mg/dL   Creatinine, Ser 0.70 0.44 - 1.00 mg/dL   Calcium 8.6 (L) 8.9 - 10.3 mg/dL   Total Protein 7.2 6.5 - 8.1 g/dL   Albumin 3.4 (L) 3.5 - 5.0 g/dL   AST 33 15 - 41 U/L   ALT 22 0 - 44 U/L   Alkaline Phosphatase 104 38 - 126 U/L   Total Bilirubin 0.3 0.3 - 1.2 mg/dL   GFR calc non Af Amer >60 >60 mL/min   GFR calc Af Amer >60 >60 mL/min   Anion gap 9 5 - 15  Urinalysis, Complete w Microscopic  Result Value Ref Range   Color, Urine YELLOW (A) YELLOW   APPearance HAZY (A) CLEAR   Specific Gravity, Urine 1.032 (H) 1.005 - 1.030   pH 7.0 5.0 - 8.0   Glucose, UA NEGATIVE NEGATIVE mg/dL   Hgb urine dipstick NEGATIVE NEGATIVE   Bilirubin Urine NEGATIVE NEGATIVE   Ketones, ur NEGATIVE NEGATIVE mg/dL   Protein, ur 30 (A)  NEGATIVE mg/dL   Nitrite NEGATIVE NEGATIVE   Leukocytes,Ua NEGATIVE NEGATIVE   RBC / HPF 0-5 0 - 5 RBC/hpf   WBC, UA 0-5 0 - 5 WBC/hpf   Bacteria, UA NONE SEEN NONE SEEN   Squamous Epithelial / LPF 0-5 0 - 5   Mucus PRESENT   CBC  Result Value Ref Range   WBC 12.9 (H) 4.0 - 10.5 K/uL   RBC 5.09 3.87 - 5.11 MIL/uL   Hemoglobin 12.0 12.0 - 15.0 g/dL   HCT 40.7 36.0 - 46.0 %   MCV 80.0 80.0 - 100.0 fL   MCH 23.6 (L) 26.0 - 34.0 pg   MCHC 29.5 (L) 30.0 - 36.0 g/dL   RDW 14.8 11.5 - 15.5 %   Platelets 480 (H) 150 - 400 K/uL   nRBC 0.0 0.0 - 0.2 %  Pregnancy, urine POC  Result Value Ref Range   Preg Test,  Ur NEGATIVE NEGATIVE        Assessment & Plan:   1. BMI 40.0-44.9, adult [facility]) Reviewed lifestyle changes, dietary efforts and exercise efforts that will need to concurrently take place with medical management of her obesity.  Discussed possible side effects of medications which she has taken before and tolerated it in the past. Will require monthly visits to monitor her weight her heart rate, blood pressure and tolerance of phentermine.  Prior to having any refills.  Patient verbalizes understanding of this plan and is in agreement - phentermine (ADIPEX-P) 37.5 MG tablet; Take 1 tablet (37.5 mg total) by mouth daily before breakfast.  Dispense: 30 tablet; Refill: 0  2. Gastroesophageal reflux disease without esophagitis Improved, well controlled currently with over-the-counter medications as needed  3. Seasonal allergic rhinitis, unspecified trigger Restart daily antihistamine, Singulair and continue Flonase, may need decongestants periodically with worse allergy seasons explained to her that she could take Zyrtec in the evening with her Singulair and occasionally take Claritin-D in the mornings when symptoms are more severe. - cetirizine (ZYRTEC) 10 MG tablet; Take 1 tablet (10 mg total) by mouth daily.  Dispense: 30 tablet; Refill: 11 - Ambulatory referral to ENT  4. Otalgia, unspecified laterality Likely eustachian tube dysfunction, encouraged her to use Flonase antihistamines, decongestants, currently external auditory canal and tympanic membranes bilaterally appear normal  5. Dysfunction of left eustachian tube Handout and information given, plan per above #3 and #4 - Ambulatory referral to ENT  6. Decreased hearing of left ear Defer any hearing testing to ENT after patient has complied with daily medications and plan as per above - Ambulatory referral to ENT       Delsa Grana, PA-C 10/07/19 10:16 AM

## 2019-10-12 ENCOUNTER — Ambulatory Visit: Payer: BC Managed Care – PPO

## 2019-11-07 ENCOUNTER — Ambulatory Visit: Payer: Medicaid Other | Admitting: Family Medicine

## 2019-11-13 ENCOUNTER — Telehealth (INDEPENDENT_AMBULATORY_CARE_PROVIDER_SITE_OTHER): Payer: BC Managed Care – PPO | Admitting: Family Medicine

## 2019-11-13 ENCOUNTER — Encounter: Payer: Self-pay | Admitting: Family Medicine

## 2019-11-13 VITALS — Ht 66.0 in | Wt 265.0 lb

## 2019-11-13 DIAGNOSIS — Z20818 Contact with and (suspected) exposure to other bacterial communicable diseases: Secondary | ICD-10-CM | POA: Diagnosis not present

## 2019-11-13 DIAGNOSIS — J029 Acute pharyngitis, unspecified: Secondary | ICD-10-CM | POA: Diagnosis not present

## 2019-11-13 MED ORDER — AMOXICILLIN 500 MG PO TABS: 500.0000 mg | ORAL_TABLET | Freq: Two times a day (BID) | ORAL | 0 refills | Status: AC

## 2019-11-13 MED ORDER — FLUCONAZOLE 150 MG PO TABS: 150.0000 mg | ORAL_TABLET | ORAL | 0 refills | Status: DC | PRN

## 2019-11-13 NOTE — Progress Notes (Signed)
Name: Sherry Spence   MRN: [id number]    DOB: March 16, 1991   Date:11/13/2019       Progress Note  Subjective:    Chief Complaint  Chief Complaint  Patient presents with  . Sore Throat    can see a white spot in the back of her throat, very sore, no fever noted, onset last week    I connected with  [name]  on 11/13/19 at  8:00 AM EDT by a video enabled telemedicine application and verified that I am speaking with the correct person using two identifiers.  I discussed the limitations of evaluation and management by telemedicine and the availability of in person appointments. The patient expressed understanding and agreed to proceed. Staff also discussed with the patient that there may be a patient responsible charge related to this service. Patient Location: home Provider Location: [facility] Additional Individuals present: none  HPI Pt presents with sore throat - very red to back of throat, sharp pain with swallowing, tolerating food and liquids, also constantly sore.  Son was dx with strep throat last week, she started feeling bad over the past week as well.  Some generalized malaise, fatigue, cervical lymphadenopathy, no fever, chills, sweats, nasal sx, cough.  Patient Active Problem List   Diagnosis Date Noted  . Microcytic anemia 01/14/2019  . Seasonal allergic rhinitis 01/14/2019  . BMI 40.0-44.9, adult ([place]) 03/18/2016  . Gastroesophageal reflux disease without esophagitis 01/20/2014    Social History   Tobacco Use  . Smoking status: Never Smoker  . Smokeless tobacco: Never Used  Substance Use Topics  . Alcohol use: No     Current Outpatient Medications:  .  cetirizine (ZYRTEC) 10 MG tablet, Take 1 tablet (10 mg total) by mouth daily., Disp: 30 tablet, Rfl: 11 .  cromolyn (OPTICROM) 4 % ophthalmic solution, Place 1 drop into both eyes 4 (four) times daily as needed (for itchy, watery or swollen eye sx)., Disp: 10 mL, Rfl: 2 .  Etonogestrel (NEXPLANON  ), Inject into the skin., Disp: , Rfl:  .  fluticasone (FLONASE) 50 MCG/ACT nasal spray, Place 2 sprays into both nostrils daily., Disp: 16 g, Rfl: 11 .  montelukast (SINGULAIR) 10 MG tablet, Take 1 tablet (10 mg total) by mouth at bedtime., Disp: 30 tablet, Rfl: 11 .  Olopatadine HCl (PATADAY) 0.2 % SOLN, Apply 1 drop to eye daily., Disp: 2.5 mL, Rfl: 2 .  pantoprazole (PROTONIX) 20 MG tablet, TAKE 1 TABLET (20 MG TOTAL) BY MOUTH EVERY MORNING. ONE HOUR BEFORE BREAKFAST, Disp: 30 tablet, Rfl: 1 .  phentermine (ADIPEX-P) 37.5 MG tablet, Take 1 tablet (37.5 mg total) by mouth daily before breakfast., Disp: 30 tablet, Rfl: 0  Allergies  Allergen Reactions  . Compazine [Prochlorperazine Edisylate] Anxiety    I personally reviewed active problem list, medication list, allergies, family history, social history, health maintenance, notes from last encounter, lab results, imaging with the patient/caregiver today.   Review of Systems  10 Systems reviewed and are negative for acute change except as noted in the HPI.   Objective:   Virtual encounter, vitals limited, only able to obtain the following Today's Vitals   11/13/19 0807  Weight: 265 lb (120.2 kg)  Height: 5\' 6"  (1.676 m)   Body mass index is 42.77 kg/m. Nursing Note and Vital Signs reviewed.  Physical Exam Vitals and nursing note reviewed.  Constitutional:      General: She is not in acute distress.    Appearance:  She is well-developed. She is obese. She is not ill-appearing, toxic-appearing or diaphoretic.  Neck:     Trachea: Trachea and phonation normal.     PE limited by telephone encounter  No results found for this or any previous visit (from the past 72 hour(s)).  Assessment and Plan:     ICD-10-CM   1. Pharyngitis, unspecified etiology  J02.9 amoxicillin (AMOXIL) 500 MG tablet  2. Exposure to strep throat  Z20.818 amoxicillin (AMOXIL) 500 MG tablet    Pt with sore throat x 1 week, son was dx last week at  pediatrician office with strep, pt has lymphadenopathy, sore throat not improving, absence of cough or nasal sx - discussed options to wait and see, to get tested for strep or just tx based off sx and higher likelyhood of strep from diagnosed family member - no fever and pt not feeling terribly ill - may not be strep? But pt wished to tx - encouraged her to complete abx and f/up if not improving Diflucan sent in x 2 for pt being prone to yeast infections with abx.   -Red flags and when to present for emergency care or RTC including fever >101.62F, chest pain, shortness of breath, new/worsening/un-resolving symptoms, reviewed with patient at time of visit. Follow up and care instructions discussed and provided in AVS. - I discussed the assessment and treatment plan with the patient. The patient was provided an opportunity to ask questions and all were answered. The patient agreed with the plan and demonstrated an understanding of the instructions.  I provided 20+ minutes of non-face-to-face time during this encounter.  Delsa Grana, PA-C 11/13/19 8:18 AM

## 2019-11-26 ENCOUNTER — Telehealth: Payer: Self-pay | Admitting: Family Medicine

## 2019-11-26 DIAGNOSIS — Z6841 Body Mass Index (BMI) 40.0 and over, adult: Secondary | ICD-10-CM

## 2019-11-27 NOTE — Telephone Encounter (Signed)
Cannot refill phentermine (controlled substances/weight loss meds) w/o in person office visits monthly to monitor tolerance, BP/HR, weight etc.    Pt will be asked to come in for visit if she would like refill.  Per last OV this was reviewed for continued use of phentermine

## 2019-11-28 NOTE — Telephone Encounter (Signed)
lvm for scheduling °

## 2019-11-29 ENCOUNTER — Other Ambulatory Visit: Payer: Self-pay | Admitting: Family Medicine

## 2019-11-29 DIAGNOSIS — Z6841 Body Mass Index (BMI) 40.0 and over, adult: Secondary | ICD-10-CM

## 2019-12-03 ENCOUNTER — Other Ambulatory Visit: Payer: Self-pay | Admitting: Family Medicine

## 2019-12-03 DIAGNOSIS — H1013 Acute atopic conjunctivitis, bilateral: Secondary | ICD-10-CM

## 2019-12-03 DIAGNOSIS — J302 Other seasonal allergic rhinitis: Secondary | ICD-10-CM

## 2020-01-17 ENCOUNTER — Encounter: Payer: Medicaid Other | Admitting: Family Medicine

## 2020-01-21 ENCOUNTER — Ambulatory Visit: Payer: Self-pay

## 2020-01-21 NOTE — Telephone Encounter (Addendum)
Pt. Started feeling bad Friday and tested positive yesterday. Has cough, body aches, fatigue, runny nose.No fever or shortness of breath. Reviewed quarantine recommendations and home treatment. Verbalizes understanding. Reports she would like further advice from her PCP. Please advise.   Reason for Disposition . [1] [id number] diagnosed by positive lab test AND [2] mild symptoms (e.g., cough, fever, others) AND [0] no complications or SOB  Answer Assessment - Initial Assessment Questions 1. COVID-19 DIAGNOSIS: "Who made your Coronavirus (COVID-19) diagnosis?" "Was it confirmed by a positive lab test?" If not diagnosed by a HCP, ask "Are there lots of cases (community spread) where you live?" (See public health department website, if unsure)     LHI 2. COVID-19 EXPOSURE: "Was there any known exposure to [place] before the symptoms began?" CDC Definition of close contact: within 6 feet (2 meters) for a total of 15 minutes or more over a 24-hour period.      No 3. ONSET: "When did the COVID-19 symptoms start?"      Started Friday 4. WORST SYMPTOM: "What is your worst symptom?" (e.g., cough, fever, shortness of breath, muscle aches)     Nasal congestion, body aches, Productive cough 5. COUGH: "Do you have a cough?" If Yes, ask: "How bad is the cough?"       Yes 6. FEVER: "Do you have a fever?" If Yes, ask: "What is your temperature, how was it measured, and when did it start?"     No 7. RESPIRATORY STATUS: "Describe your breathing?" (e.g., shortness of breath, wheezing, unable to speak)      No 8. BETTER-SAME-WORSE: "Are you getting better, staying the same or getting worse compared to yesterday?"  If getting worse, ask, "In what way?"     Same 9. HIGH RISK DISEASE: "Do you have any chronic medical problems?" (e.g., asthma, heart or lung disease, weak immune system, obesity, etc.)     No 10. PREGNANCY: "Is there any chance you are pregnant?" "When was your last menstrual period?"       No 11. OTHER  SYMPTOMS: "Do you have any other symptoms?"  (e.g., chills, fatigue, headache, loss of smell or taste, muscle pain, sore throat; new loss of smell or taste especially support the diagnosis of COVID-19)       Fatigue, body aches  Protocols used: CORONAVIRUS (COVID-19) DIAGNOSED OR SUSPECTED-A-AH

## 2020-01-21 NOTE — Telephone Encounter (Signed)
Called left detailed voicemail to quarantine, take OTC meds for symptoms and for worsening symptoms or fever that want go away with tylenol and SOB to be check out by ER.  If she felt she needed a virtual/telephone visit with [place] she could call back and schedule.

## 2020-01-21 NOTE — Telephone Encounter (Signed)
Pt. Called back and reports she has had right sided chest pain that hurts into her back x 2 days. Hurts when she coughs and with movement.Pain 7/10. Pt. Has a virtual appointment 01/23/20. No shortness of breath. Instructed pt. To go to UC/ED . States she may go to UC. Answer Assessment - Initial Assessment Questions 1. LOCATION: "Where does it hurt?"       Right side 2. RADIATION: "Does the pain go anywhere else?" (e.g., into neck, jaw, arms, back)     Back 3. ONSET: "When did the chest pain begin?" (Minutes, hours or days)      2 days ago 4. PATTERN "Does the pain come and go, or has it been constant since it started?"  "Does it get worse with exertion?"      Comes and goes 5. DURATION: "How long does it last" (e.g., seconds, minutes, hours)      Seconds 6. SEVERITY: "How bad is the pain?"  (e.g., Scale 1-10; mild, moderate, or severe)    - MILD (1-3): doesn't interfere with normal activities     - MODERATE (4-7): interferes with normal activities or awakens from sleep    - SEVERE (8-10): excruciating pain, unable to do any normal activities       7 7. CARDIAC RISK FACTORS: "Do you have any history of heart problems or risk factors for heart disease?" (e.g., angina, prior heart attack; diabetes, high blood pressure, high cholesterol, smoker, or strong family history of heart disease)     No 8. PULMONARY RISK FACTORS: "Do you have any history of lung disease?"  (e.g., blood clots in lung, asthma, emphysema, birth control pills)     No 9. CAUSE: "What do you think is causing the chest pain?"     Unsure 10. OTHER SYMPTOMS: "Do you have any other symptoms?" (e.g., dizziness, nausea, vomiting, sweating, fever, difficulty breathing, cough)       Cough 11. PREGNANCY: "Is there any chance you are pregnant?" "When was your last menstrual period?"       No  Protocols used: CHEST PAIN-A-AH

## 2020-01-22 NOTE — Telephone Encounter (Signed)
  Reason for Disposition . [1] Chest pain lasts > 5 minutes AND [2] occurred in past 3 days (72 hours) (Exception: feels exactly the same as previously diagnosed heartburn and has accompanying sour taste in mouth)  Protocols used: CHEST PAIN-A-AH

## 2020-01-22 NOTE — Progress Notes (Signed)
Patient is a 29 year old female patient of Delsa Grana Last visit with her was a video visit 11/13/2019

## 2020-01-23 ENCOUNTER — Telehealth: Payer: BC Managed Care – PPO | Admitting: Internal Medicine

## 2020-01-23 ENCOUNTER — Telehealth: Payer: Self-pay

## 2020-01-23 DIAGNOSIS — Z91199 Patient's noncompliance with other medical treatment and regimen due to unspecified reason: Secondary | ICD-10-CM

## 2020-01-23 DIAGNOSIS — Z5329 Procedure and treatment not carried out because of patient's decision for other reasons: Secondary | ICD-10-CM

## 2020-01-23 NOTE — Telephone Encounter (Signed)
4 attempts have been made to reach patient by phone. Her name is verified on VM, the phone goes straight to VM. Patient will need to reschedule her appointment.

## 2020-01-31 NOTE — Progress Notes (Deleted)
Patient: Sherry Spence, Female    DOB: 06-Aug-1990, 29 y.o.   MRN: [id number] Delsa Grana, PA-C Visit Date: 01/31/2020  Today's Provider: Delsa Grana, PA-C   No chief complaint on file.  Subjective:   Annual physical exam:  Sherry Spence is a 29 y.o. female who presents today for complete physical exam:  Exercise/Activity:  Diet/nutrition:  Sleep:   Pt wished to discuss acute complaints *** do routine f/up on chronic conditions today in addition to CPE. Advised pt of separate visit billing/coding  USPSTF grade A and B recommendations - reviewed and addressed today  Depression:  Phq 9 completed today by patient, was reviewed by me with patient in the room PHQ score is ***, pt feels *** PHQ 2/9 Scores 11/13/2019 10/07/2019 09/03/2019 07/30/2019  PHQ - 2 Score 0 0 0 0  PHQ- 9 Score 0 0 0 0   Depression screen [facility] 2/9 11/13/2019 10/07/2019 09/03/2019 07/30/2019 02/22/2019  Decreased Interest 0 0 0 0 0  Down, Depressed, Hopeless 0 0 0 0 0  PHQ - 2 Score 0 0 0 0 0  Altered sleeping 0 0 0 0 0  Tired, decreased energy 0 0 0 0 0  Change in appetite 0 0 0 0 0  Feeling bad or failure about yourself  0 0 0 0 0  Trouble concentrating 0 0 0 0 0  Moving slowly or fidgety/restless 0 0 0 0 0  Suicidal thoughts 0 0 0 0 0  PHQ-9 Score 0 0 0 0 0  Difficult doing work/chores Not difficult at all Not difficult at all - Not difficult at all Not difficult at all    Alcohol screening:   Office Visit from 10/07/2019 in [facility]  AUDIT-C Score 0      Immunizations and Health Maintenance: Health Maintenance  Topic Date Due  . Hepatitis C Screening  Never done  . COVID-19 Vaccine (1) Never done  . PAP SMEAR-Modifier  11/29/2019  . INFLUENZA VACCINE  12/22/2019  . TETANUS/TDAP  03/18/2026  . HIV Screening  Completed     Hep C Screening: ordered  STD testing and prevention (HIV/chl/gon/syphilis):  HIV completed on 04/12/2017  Intimate partner  violence:***  Sexual History/Pain during Intercourse: Significant Other  Menstrual History/LMP/Abnormal Bleeding: *** No LMP recorded.  Incontinence Symptoms: ***  Breast cancer:  Last Mammogram: n/a BRCA gene screening: ***  Cervical cancer screening: ordered Pt *** family hx of cancers - breast, ovarian, uterine, colon:     Osteoporosis:  n/a Discussion on osteoporosis per age, including high calcium and vitamin D supplementation, weight bearing exercises Pt is n/a supplementing with daily calcium/Vit D. N/A Bone scan/dexa Roughly experienced menopause at age N/A  Skin cancer:  Hx of skin CA -  NO*** Discussed atypical lesions   Colorectal cancer:   Colonoscopy is n/a   Discussed concerning signs and sx of CRC, pt denies ***  Lung cancer:   Low Dose CT Chest recommended if Age 62-80 years, 30 pack-year currently smoking OR have quit w/in 15years. Patient does not qualify.    Social History   Tobacco Use  . Smoking status: Never Smoker  . Smokeless tobacco: Never Used  Vaping Use  . Vaping Use: Never used  Substance Use Topics  . Alcohol use: No  . Drug use: No       Office Visit from 10/07/2019 in [facility]  AUDIT-C Score 0      Family History  Problem Relation Age of Onset  . Hypertension Father   . Diabetes Paternal Aunt   . Cancer Maternal Grandmother   . Diabetes Maternal Grandmother   . Dementia Maternal Grandmother   . Diabetes Paternal Grandmother   . Hypertension Paternal Grandmother   . Asthma Mother   . Asthma Sister   . Cancer Paternal Grandfather   . Crohn's disease Sister   . Asthma Sister   . Mental retardation Neg Hx      Blood pressure/Hypertension: BP Readings from Last 3 Encounters:  10/07/19 124/78  09/03/19 128/82  07/30/19 116/82    Weight/Obesity: Wt Readings from Last 3 Encounters:  11/13/19 265 lb (120.2 kg)  10/07/19 271 lb 1.6 oz (123 kg)  09/03/19 269 lb 11.2 oz (122.3 kg)   BMI Readings  from Last 3 Encounters:  11/13/19 42.77 kg/m  10/07/19 42.46 kg/m  09/03/19 42.24 kg/m     Lipids:  Lab Results  Component Value Date   CHOL 133 01/15/2019   Lab Results  Component Value Date   HDL 37 (L) 01/15/2019   Lab Results  Component Value Date   LDLCALC 82 01/15/2019   Lab Results  Component Value Date   TRIG 67 01/15/2019   Lab Results  Component Value Date   CHOLHDL 3.6 01/15/2019   No results found for: LDLDIRECT Based on the results of lipid panel his/her cardiovascular risk factor ( using [place] )  in the next 10 years is: The ASCVD Risk score Mikey Bussing DC Jr., et al., 2013) failed to calculate for the following reasons:   The 2013 ASCVD risk score is only valid for ages 24 to 33 Glucose:  Glucose, Bld  Date Value Ref Range Status  03/23/2019 118 (H) 70 - 99 mg/dL Final  01/15/2019 90 65 - 99 mg/dL Final    Comment:    .            Fasting reference interval .   08/21/2018 92 70 - 99 mg/dL Final   Hypertension: BP Readings from Last 3 Encounters:  10/07/19 124/78  09/03/19 128/82  07/30/19 116/82   Obesity: Wt Readings from Last 3 Encounters:  11/13/19 265 lb (120.2 kg)  10/07/19 271 lb 1.6 oz (123 kg)  09/03/19 269 lb 11.2 oz (122.3 kg)   BMI Readings from Last 3 Encounters:  11/13/19 42.77 kg/m  10/07/19 42.46 kg/m  09/03/19 42.24 kg/m      Advanced Care Planning:  A voluntary discussion about advance care planning including the explanation and discussion of advance directives.   Discussed health care proxy and Living will, and the patient was able to identify a health care proxy as ***.   Patient {DOES_DOES WRU:04540} have a living will at present time.   Social History      She        Social History   Socioeconomic History  . Marital status: Significant Other    Spouse name: Not on file  . Number of children: 3  . Years of education: 66  . Highest education level: Associate degree: occupational, Hotel manager, or  vocational program  Occupational History  . Not on file  Tobacco Use  . Smoking status: Never Smoker  . Smokeless tobacco: Never Used  Vaping Use  . Vaping Use: Never used  Substance and Sexual Activity  . Alcohol use: No  . Drug use: No  . Sexual activity: Yes    Birth control/protection: Implant  Other Topics Concern  . Not on  file  Social History Narrative   Getting Married Sept 20th (? Or close to that) 2020 with fiance of several years   Three children ages 38, 94 and [place]   Social Determinants of Health   Financial Resource Strain:   . Difficulty of Paying Living Expenses: Not on file  Food Insecurity:   . Worried About Charity fundraiser in the Last Year: Not on file  . Ran Out of Food in the Last Year: Not on file  Transportation Needs:   . Lack of Transportation (Medical): Not on file  . Lack of Transportation (Non-Medical): Not on file  Physical Activity:   . Days of Exercise per Week: Not on file  . Minutes of Exercise per Session: Not on file  Stress:   . Feeling of Stress : Not on file  Social Connections:   . Frequency of Communication with Friends and Family: Not on file  . Frequency of Social Gatherings with Friends and Family: Not on file  . Attends Religious Services: Not on file  . Active Member of Clubs or Organizations: Not on file  . Attends Archivist Meetings: Not on file  . Marital Status: Not on file    Family History        Family History  Problem Relation Age of Onset  . Hypertension Father   . Diabetes Paternal Aunt   . Cancer Maternal Grandmother   . Diabetes Maternal Grandmother   . Dementia Maternal Grandmother   . Diabetes Paternal Grandmother   . Hypertension Paternal Grandmother   . Asthma Mother   . Asthma Sister   . Cancer Paternal Grandfather   . Crohn's disease Sister   . Asthma Sister   . Mental retardation Neg Hx     Patient Active Problem List     Diagnosis Date Noted  . Microcytic anemia 01/14/2019  . Seasonal allergic rhinitis 01/14/2019  . BMI 40.0-44.9, adult ([place]) 03/18/2016  . Gastroesophageal reflux disease without esophagitis 01/20/2014    Past Surgical History:  Procedure Laterality Date  . NO PAST SURGERIES       Current Outpatient Medications:  .  cetirizine (ZYRTEC) 10 MG tablet, Take 1 tablet (10 mg total) by mouth daily., Disp: 30 tablet, Rfl: 11 .  cromolyn (OPTICROM) 4 % ophthalmic solution, PLACE 1 DROP INTO BOTH EYES 4 (FOUR) TIMES DAILY AS NEEDED (FOR ITCHY, WATERY OR SWOLLEN EYE SX)., Disp: 30 mL, Rfl: 2 .  Etonogestrel (NEXPLANON [place]), Inject into the skin., Disp: , Rfl:  .  fluconazole (DIFLUCAN) 150 MG tablet, Take 1 tablet (150 mg total) by mouth every 3 (three) days as needed (for vaginal itching/yeast infection sx)., Disp: 2 tablet, Rfl: 0 .  fluticasone (FLONASE) 50 MCG/ACT nasal spray, Place 2 sprays into both nostrils daily., Disp: 16 g, Rfl: 11 .  montelukast (SINGULAIR) 10 MG tablet, Take 1 tablet (10 mg total) by mouth at bedtime., Disp: 30 tablet, Rfl: 11 .  Olopatadine HCl (PATADAY) 0.2 % SOLN, Apply 1 drop to eye daily., Disp: 2.5 mL, Rfl: 2 .  pantoprazole (PROTONIX) 20 MG tablet, TAKE 1 TABLET (20 MG TOTAL) BY MOUTH EVERY MORNING. ONE HOUR BEFORE BREAKFAST, Disp: 30 tablet, Rfl: 1 .  phentermine (ADIPEX-P) 37.5 MG tablet, Take 1 tablet (37.5 mg total) by mouth daily before breakfast., Disp: 30 tablet, Rfl: 0  Allergies  Allergen Reactions  . Compazine [Prochlorperazine Edisylate]  Anxiety    Patient Care Team: Delsa Grana, PA-C as PCP - General (Family Medicine)  Review of Systems   ***       Objective:   Vitals:  There were no vitals filed for this visit.  There is no height or weight on file to calculate BMI.  Physical Exam    Fall Risk: Fall Risk  11/13/2019 10/07/2019 09/03/2019 07/30/2019 02/22/2019  Falls in the past year? 0 0 0 0 0  Number falls in past yr: - 0 0 0 0   Injury with Fall? - 0 0 0 0  Follow up - Falls evaluation completed - - -    Functional Status Survey:     Assessment & Plan:    CPE completed today  . USPSTF grade A and B recommendations reviewed with patient; age-appropriate recommendations, preventive care, screening tests, etc discussed and encouraged; healthy living encouraged; see AVS for patient education given to patient  . Discussed importance of 150 minutes of physical activity weekly, AHA exercise recommendations given to pt in AVS/handout  . Discussed importance of healthy diet:  eating lean meats and proteins, avoiding trans fats and saturated fats, avoid simple sugars and excessive carbs in diet, eat 6 servings of fruit/vegetables daily and drink plenty of water and avoid sweet beverages.    . Recommended pt to do annual eye exam and routine dental exams/cleanings  . Depression, alcohol, fall screening completed as documented above and per flowsheets  . Reviewed Health Maintenance: Health Maintenance  Topic Date Due  . Hepatitis C Screening  Never done  . COVID-19 Vaccine (1) Never done  . PAP SMEAR-Modifier  11/29/2019  . INFLUENZA VACCINE  12/22/2019  . TETANUS/TDAP  03/18/2026  . HIV Screening  Completed    . Immunizations: Immunization History  Administered Date(s) Administered  . Influenza-Unspecified 04/05/2016  . PPD Test 12/22/2013, 07/22/2015  . Tdap 03/18/2016    ***  No orders of the defined types were placed in this encounter.       Cathrine Muster, CMA 01/31/20 4:02 PM  [facility]

## 2020-02-03 ENCOUNTER — Encounter: Payer: Medicaid Other | Admitting: Family Medicine

## 2020-03-09 ENCOUNTER — Ambulatory Visit: Payer: Self-pay | Admitting: *Deleted

## 2020-03-09 ENCOUNTER — Other Ambulatory Visit: Payer: Self-pay

## 2020-03-09 ENCOUNTER — Encounter: Payer: Self-pay | Admitting: Family Medicine

## 2020-03-09 ENCOUNTER — Telehealth (INDEPENDENT_AMBULATORY_CARE_PROVIDER_SITE_OTHER): Payer: BC Managed Care – PPO | Admitting: Family Medicine

## 2020-03-09 DIAGNOSIS — Z91199 Patient's noncompliance with other medical treatment and regimen due to unspecified reason: Secondary | ICD-10-CM

## 2020-03-09 DIAGNOSIS — Z5329 Procedure and treatment not carried out because of patient's decision for other reasons: Secondary | ICD-10-CM

## 2020-03-09 NOTE — Telephone Encounter (Addendum)
Patient has had chest pain, headache muscle aches- started last Friday. Patient states the chest pain can be sharp and switches sides- she states she has dull pain too. Patient states she has back pain and muscle pian and headache. Her pain is intermittent and comes and goes. Patient has been seen for this before and states this pain is very similar to the pain in the past. Appointment scheduled for virtual visit due to the symptoms.  Answer Assessment - Initial Assessment Questions 1. LOCATION: "Where does it hurt?"       switches sides-R to L 2. RADIATION: "Does the pain go anywhere else?" (e.g., into neck, jaw, arms, back)     No radiation- feels seperate pain in back 3. ONSET: "When did the chest pain begin?" (Minutes, hours or days)      Friday 4. PATTERN "Does the pain come and go, or has it been constant since it started?"  "Does it get worse with exertion?"      Comes and goes, does not get worse with exertion 5. DURATION: "How long does it last" (e.g., seconds, minutes, hours)     Seconds for intense pain- dull pain- hours 6. SEVERITY: "How bad is the pain?"  (e.g., Scale 1-10; mild, moderate, or severe)    - MILD (1-3): doesn't interfere with normal activities     - MODERATE (4-7): interferes with normal activities or awakens from sleep    - SEVERE (8-10): excruciating pain, unable to do any normal activities       4-5 7. CARDIAC RISK FACTORS: "Do you have any history of heart problems or risk factors for heart disease?" (e.g., angina, prior heart attack; diabetes, high blood pressure, high cholesterol, smoker, or strong family history of heart disease)     No- overweight 8. PULMONARY RISK FACTORS: "Do you have any history of lung disease?"  (e.g., blood clots in lung, asthma, emphysema, birth control pills)     no 9. CAUSE: "What do you think is causing the chest pain?"     No- cold/sinus 10. OTHER SYMPTOMS: "Do you have any other symptoms?" (e.g., dizziness, nausea, vomiting,  sweating, fever, difficulty breathing, cough)       No- GERD hx- nausea all the time 11. PREGNANCY: "Is there any chance you are pregnant?" "When was your last menstrual period?"       Nexplanon  Protocols used: CHEST PAIN-A-AH

## 2020-03-09 NOTE — Progress Notes (Signed)
Name: Sherry Spence   MRN: [id number]    DOB: [birth year]   Date:04/02/2020       Progress Note  Subjective:   Called pt - no answer Second attempt  With Complaints pt need UC or ED eval (for CP) and likely needs COVID testing/screening  Chief Complaint  Chief Complaint  Patient presents with  . Headache  . Chest Pain  . Back Pain    upper and lower back      I discussed the limitations, risks, security and privacy concerns of performing an evaluation and management service by telephone and the availability of in person appointments. Staff also discussed with the patient that there may be a patient responsible charge related to this service.  Patient verbalized understanding and agreed to proceed with encounter. Patient Location:  Provider Location:  Additional Individuals present:   HPI No encounter done today     Patient Active Problem List   Diagnosis Date Noted  . Microcytic anemia 01/14/2019  . Seasonal allergic rhinitis 01/14/2019  . BMI 40.0-44.9, adult ([place]) 03/18/2016  . Gastroesophageal reflux disease without esophagitis 01/20/2014    Social History   Tobacco Use  . Smoking status: Never Smoker  . Smokeless tobacco: Never Used  Substance Use Topics  . Alcohol use: No     Current Outpatient Medications:  .  cetirizine (ZYRTEC) 10 MG tablet, Take 1 tablet (10 mg total) by mouth daily., Disp: 30 tablet, Rfl: 11 .  cromolyn (OPTICROM) 4 % ophthalmic solution, PLACE 1 DROP INTO BOTH EYES 4 (FOUR) TIMES DAILY AS NEEDED (FOR ITCHY, WATERY OR SWOLLEN EYE SX)., Disp: 30 mL, Rfl: 2 .  Etonogestrel (NEXPLANON [place]), Inject into the skin., Disp: , Rfl:  .  fluconazole (DIFLUCAN) 150 MG tablet, Take 1 tablet (150 mg total) by mouth every 3 (three) days as needed (for vaginal itching/yeast infection sx)., Disp: 2 tablet, Rfl: 0 .  fluticasone (FLONASE) 50 MCG/ACT nasal spray, Place 2 sprays into both nostrils daily., Disp: 16 g, Rfl: 11 .  montelukast  (SINGULAIR) 10 MG tablet, Take 1 tablet (10 mg total) by mouth at bedtime., Disp: 30 tablet, Rfl: 11 .  Olopatadine HCl (PATADAY) 0.2 % SOLN, Apply 1 drop to eye daily., Disp: 2.5 mL, Rfl: 2 .  pantoprazole (PROTONIX) 20 MG tablet, TAKE 1 TABLET (20 MG TOTAL) BY MOUTH EVERY MORNING. ONE HOUR BEFORE BREAKFAST, Disp: 30 tablet, Rfl: 1 .  phentermine (ADIPEX-P) 37.5 MG tablet, Take 1 tablet (37.5 mg total) by mouth daily before breakfast., Disp: 30 tablet, Rfl: 0  Allergies  Allergen Reactions  . Compazine [Prochlorperazine Edisylate] Anxiety    Chart Review:   Review of Systems   Objective:    Virtual encounter, vitals limited, only able to obtain the following Today's Vitals   03/09/20 1333  PainSc: 4    There is no height or weight on file to calculate BMI. Nursing Note and Vital Signs reviewed.  Physical Exam  PE limited by telephone encounter  No results found for this or any previous visit (from the past 72 hour(s)).  Assessment and Plan:     ICD-10-CM   1. No-show for appointment  Z53.29     -Red flags and when to present for emergency care or RTC including but not limited to new/worsening/un-resolving symptoms,  reviewed with patient at time of visit. Follow up and care instructions discussed and provided in AVS. - I discussed the assessment and treatment plan with the patient. The patient was  provided an opportunity to ask questions and all were answered. The patient agreed with the plan and demonstrated an understanding of the instructions.  - The patient was advised to call back or seek an in-person evaluation if the symptoms worsen or if the condition fails to improve as anticipated.  I provided  minutes of non-face-to-face time during this encounter.  Delsa Grana, PA-C 04/02/20 1:42 PM

## 2020-03-09 NOTE — Telephone Encounter (Signed)
°  Reason for Disposition  [1] Chest pain lasts > 5 minutes AND [2] occurred > 3 days ago (72 hours) AND [3] NO chest pain or cardiac symptoms now  Protocols used: CHEST PAIN-A-AH

## 2020-09-08 ENCOUNTER — Ambulatory Visit (INDEPENDENT_AMBULATORY_CARE_PROVIDER_SITE_OTHER): Payer: Medicaid Other | Admitting: Family Medicine

## 2020-09-08 ENCOUNTER — Encounter: Payer: Self-pay | Admitting: Family Medicine

## 2020-09-08 ENCOUNTER — Other Ambulatory Visit: Payer: Self-pay

## 2020-09-08 VITALS — BP 126/85 | HR 78 | Ht 67.0 in | Wt 282.0 lb

## 2020-09-08 DIAGNOSIS — Z01812 Encounter for preprocedural laboratory examination: Secondary | ICD-10-CM

## 2020-09-08 DIAGNOSIS — Z30017 Encounter for initial prescription of implantable subdermal contraceptive: Secondary | ICD-10-CM | POA: Diagnosis not present

## 2020-09-08 DIAGNOSIS — Z3046 Encounter for surveillance of implantable subdermal contraceptive: Secondary | ICD-10-CM

## 2020-09-08 LAB — POCT URINE PREGNANCY: Preg Test, Ur: NEGATIVE

## 2020-09-08 MED ORDER — ETONOGESTREL 68 MG ~~LOC~~ IMPL
68.0000 mg | DRUG_IMPLANT | Freq: Once | SUBCUTANEOUS | Status: AC
  Administered 2020-09-08: 68 mg via SUBCUTANEOUS

## 2020-09-08 NOTE — Progress Notes (Signed)
     GYNECOLOGY OFFICE PROCEDURE NOTE  Sherry Spence is a 30 y.o. [phone] here for Nexplanon removal and  Re-insertion. Pap smear is due, but bleeding today.  No other gynecologic concerns.   Nexplanon Removal and Reinsertion Patient identified, informed consent performed, consent signed.   Patient does understand that irregular bleeding is a very common side effect of this medication. She was advised to have backup contraception for one week after replacement of the implant. Pregnancy test in clinic today was negative.  Appropriate time out taken. Nexplanon site identified in left arm.  Area prepped in usual sterile fashon. One ml of 1% lidocaine was used to anesthetize the area at the distal end of the implant. A small stab incision was made right beside the implant on the distal portion. The Nexplanon rod was grasped using hemostats and removed without difficulty. There was minimal blood loss. There were no complications. Area was then injected with 3 ml of 1 % lidocaine. She was re-prepped with betadine, Nexplanon removed from packaging, Device confirmed in needle, then inserted full length of needle and withdrawn per handbook instructions. Nexplanon was able to palpated in the patient's arm; patient palpated the insert herself.  There was minimal blood loss. Patient insertion site covered with gauze and a pressure bandage to reduce any bruising. The patient tolerated the procedure well and was given post procedure instructions.  She was advised to have backup contraception for two week.     Return in about 2 weeks (around 09/22/2020) for a CPE.   Donnamae Jude, MD 09/08/2020 11:58 AM

## 2020-09-16 ENCOUNTER — Telehealth: Payer: Self-pay | Admitting: Family Medicine

## 2020-09-16 NOTE — Telephone Encounter (Signed)
Called and left a vm for pt to call and schedule an appt

## 2020-09-16 NOTE — Telephone Encounter (Signed)
Pt scheduled  

## 2020-09-16 NOTE — Telephone Encounter (Signed)
Pt states she has a boil on her inner thigh that is very sore.  It has not come to a head yet. Pt asked that I send a message to [facility] and perhaps she can prescribe an abx or do a virtual. Please advise Pt would like a phone call.

## 2020-09-16 NOTE — Telephone Encounter (Signed)
Pt called back but did not end up scheduling an appt, pt requested a call back from someone directly at the office. Please advise

## 2020-09-17 ENCOUNTER — Ambulatory Visit: Payer: BC Managed Care – PPO | Admitting: Family Medicine

## 2020-09-17 NOTE — Progress Notes (Deleted)
    Patient ID: Sherry Spence, female    DOB: 03-Jan-1991, 30 y.o.   MRN: [id number]  PCP: Delsa Grana, PA-C  No chief complaint on file.   Subjective:   Sherry Spence is a 30 y.o. female, presents to clinic with CC of the following:  HPI    Patient Active Problem List   Diagnosis Date Noted  . Microcytic anemia 01/14/2019  . Seasonal allergic rhinitis 01/14/2019  . BMI 40.0-44.9, adult ([place]) 03/18/2016  . Gastroesophageal reflux disease without esophagitis 01/20/2014      Current Outpatient Medications:  .  cetirizine (ZYRTEC) 10 MG tablet, Take 1 tablet (10 mg total) by mouth daily., Disp: 30 tablet, Rfl: 11 .  cromolyn (OPTICROM) 4 % ophthalmic solution, PLACE 1 DROP INTO BOTH EYES 4 (FOUR) TIMES DAILY AS NEEDED (FOR ITCHY, WATERY OR SWOLLEN EYE SX)., Disp: 30 mL, Rfl: 2 .  Etonogestrel (NEXPLANON [place]), Inject into the skin., Disp: , Rfl:  .  fluconazole (DIFLUCAN) 150 MG tablet, Take 1 tablet (150 mg total) by mouth every 3 (three) days as needed (for vaginal itching/yeast infection sx). (Patient not taking: Reported on 09/08/2020), Disp: 2 tablet, Rfl: 0 .  fluticasone (FLONASE) 50 MCG/ACT nasal spray, Place 2 sprays into both nostrils daily., Disp: 16 g, Rfl: 11 .  montelukast (SINGULAIR) 10 MG tablet, Take 1 tablet (10 mg total) by mouth at bedtime. (Patient not taking: Reported on 09/08/2020), Disp: 30 tablet, Rfl: 11 .  Olopatadine HCl (PATADAY) 0.2 % SOLN, Apply 1 drop to eye daily. (Patient not taking: Reported on 09/08/2020), Disp: 2.5 mL, Rfl: 2 .  pantoprazole (PROTONIX) 20 MG tablet, TAKE 1 TABLET (20 MG TOTAL) BY MOUTH EVERY MORNING. ONE HOUR BEFORE BREAKFAST (Patient not taking: Reported on 09/08/2020), Disp: 30 tablet, Rfl: 1 .  phentermine (ADIPEX-P) 37.5 MG tablet, Take 1 tablet (37.5 mg total) by mouth daily before breakfast. (Patient not taking: Reported on 09/08/2020), Disp: 30 tablet, Rfl: 0   Allergies  Allergen Reactions  . Compazine  [Prochlorperazine Edisylate] Anxiety     Social History   Tobacco Use  . Smoking status: Never Smoker  . Smokeless tobacco: Never Used  Vaping Use  . Vaping Use: Never used  Substance Use Topics  . Alcohol use: No  . Drug use: No      Chart Review Today: ***  Review of Systems     Objective:   There were no vitals filed for this visit.  There is no height or weight on file to calculate BMI.  Physical Exam   Results for orders placed or performed in visit on 09/08/20  POCT urine pregnancy  Result Value Ref Range   Preg Test, Ur Negative Negative       Assessment & Plan:   ***     Delsa Grana, PA-C 09/17/20 2:33 PM

## 2020-10-13 ENCOUNTER — Ambulatory Visit: Payer: Medicaid Other | Admitting: Obstetrics & Gynecology

## 2020-10-13 ENCOUNTER — Other Ambulatory Visit: Payer: Self-pay | Admitting: Family Medicine

## 2020-10-13 DIAGNOSIS — J302 Other seasonal allergic rhinitis: Secondary | ICD-10-CM

## 2020-11-10 ENCOUNTER — Ambulatory Visit: Payer: Medicaid Other | Admitting: Family Medicine

## 2020-11-10 ENCOUNTER — Telehealth (INDEPENDENT_AMBULATORY_CARE_PROVIDER_SITE_OTHER): Payer: Medicaid Other | Admitting: Family Medicine

## 2020-11-10 ENCOUNTER — Encounter: Payer: Self-pay | Admitting: Family Medicine

## 2020-11-10 VITALS — Ht 67.0 in | Wt 282.0 lb

## 2020-11-10 DIAGNOSIS — B379 Candidiasis, unspecified: Secondary | ICD-10-CM | POA: Diagnosis not present

## 2020-11-10 DIAGNOSIS — R350 Frequency of micturition: Secondary | ICD-10-CM

## 2020-11-10 DIAGNOSIS — T3695XA Adverse effect of unspecified systemic antibiotic, initial encounter: Secondary | ICD-10-CM | POA: Diagnosis not present

## 2020-11-10 MED ORDER — FLUCONAZOLE 150 MG PO TABS: 150.0000 mg | ORAL_TABLET | ORAL | 0 refills | Status: DC | PRN

## 2020-11-10 MED ORDER — NITROFURANTOIN MONOHYD MACRO 100 MG PO CAPS: 100.0000 mg | ORAL_CAPSULE | Freq: Two times a day (BID) | ORAL | 0 refills | Status: DC

## 2020-11-10 NOTE — Progress Notes (Signed)
Name: Sherry Spence   MRN: [id number]    DOB: 10/18/1990   Date:11/10/2020       Progress Note  Subjective:    Chief Complaint  Chief Complaint  Patient presents with   Urinary Tract Infection    I connected with  Sherry Spence  on 11/10/20 at  3:40 PM EDT by a video enabled telemedicine application and verified that I am speaking with the correct person using two identifiers.  I discussed the limitations of evaluation and management by telemedicine and the availability of in person appointments. The patient expressed understanding and agreed to proceed. Staff also discussed with the patient that there may be a patient responsible charge related to this service. Patient Location:  work [employer], [employer]: [facility] Additional Individuals present:  none  Urinary Tract Infection   Pt presents with urinary sx onset a few days ago, consistent with her past UTI's, none that I can see for the past 3 years Cloudy urine, smell/odor, urinary frequency Morning urine sometimes pain/discomfort No hematuria, abd pain, flank pain, vaginal sx, no change of pregnancy, no new sexual partners or possible exposure to STD No genital lesions/sores/itching burning  Reviewed all past micro and labs - neg urine preg x2 with replacement of nexplanon C&S + for E.coli in 2019, none besides that Neg STD tests over the past couple years  Needs a dose of diflucan with abx tx   Patient Active Problem List   Diagnosis Date Noted   Microcytic anemia 01/14/2019   Seasonal allergic rhinitis 01/14/2019   BMI 40.0-44.9, adult ([place]) 03/18/2016   H/O chlamydia infection 02/28/2014   Gastroesophageal reflux disease without esophagitis 01/20/2014    Social History   Tobacco Use   Smoking status: Never   Smokeless tobacco: Never  Substance Use Topics   Alcohol use: No     Current Outpatient Medications:    cetirizine (ZYRTEC) 10 MG tablet, Take 1 tablet (10 mg total) by mouth daily., Disp: 30  tablet, Rfl: 11   cromolyn (OPTICROM) 4 % ophthalmic solution, PLACE 1 DROP INTO BOTH EYES 4 (FOUR) TIMES DAILY AS NEEDED (FOR ITCHY, WATERY OR SWOLLEN EYE SX)., Disp: 30 mL, Rfl: 2   Etonogestrel (NEXPLANON [place]), Inject into the skin., Disp: , Rfl:    fluticasone (FLONASE) 50 MCG/ACT nasal spray, Place 2 sprays into both nostrils daily., Disp: 16 g, Rfl: 11   pantoprazole (PROTONIX) 20 MG tablet, TAKE 1 TABLET (20 MG TOTAL) BY MOUTH EVERY MORNING. ONE HOUR BEFORE BREAKFAST, Disp: 30 tablet, Rfl: 1   phentermine (ADIPEX-P) 37.5 MG tablet, Take 1 tablet (37.5 mg total) by mouth daily before breakfast., Disp: 30 tablet, Rfl: 0   fluconazole (DIFLUCAN) 150 MG tablet, Take 1 tablet (150 mg total) by mouth every 3 (three) days as needed (for vaginal itching/yeast infection sx). (Patient not taking: No sig reported), Disp: 2 tablet, Rfl: 0   montelukast (SINGULAIR) 10 MG tablet, Take 1 tablet (10 mg total) by mouth at bedtime. (Patient not taking: No sig reported), Disp: 30 tablet, Rfl: 11   Olopatadine HCl (PATADAY) 0.2 % SOLN, Apply 1 drop to eye daily. (Patient not taking: No sig reported), Disp: 2.5 mL, Rfl: 2  Allergies  Allergen Reactions   Compazine [Prochlorperazine Edisylate] Anxiety    I personally reviewed active problem list, medication list, allergies, family history, social history, health maintenance, notes from last encounter, lab results, imaging with the patient/caregiver today.   Review of Systems  Constitutional: Negative.  HENT: Negative.    Eyes: Negative.   Respiratory: Negative.    Cardiovascular: Negative.   Gastrointestinal: Negative.   Endocrine: Negative.   Genitourinary: Negative.   Musculoskeletal: Negative.   Skin: Negative.   Allergic/Immunologic: Negative.   Neurological: Negative.   Hematological: Negative.   Psychiatric/Behavioral: Negative.    All other systems reviewed and are negative.   Objective:   Virtual encounter, vitals limited, only able to  obtain the following Today's Vitals   11/10/20 1545  Weight: 282 lb (127.9 kg)  Height: 5\' 7"  (1.702 m)  PainSc: 0-No pain   Body mass index is 44.17 kg/m. Nursing Note and Vital Signs reviewed.  Physical Exam Vitals and nursing note reviewed.  Constitutional:      Appearance: She is obese.     Comments: Well appearing  Neurological:     Mental Status: She is alert.    PE limited by telephone encounter  No results found for this or any previous visit (from the past 72 hour(s)).  Assessment and Plan:     ICD-10-CM   1. Urinary frequency  R35.0 Urinalysis, Routine w reflex microscopic    Urine Culture    Cervicovaginal ancillary only    nitrofurantoin, macrocrystal-monohydrate, (MACROBID) 100 MG capsule   emperic tx for simple cystitis - f/up with labs if sx do not improve    2. Antibiotic-induced yeast infection  B37.9 fluconazole (DIFLUCAN) 150 MG tablet   T36.95XA        -Red flags and when to present for emergency care or RTC including fever >101.93F, chest pain, shortness of breath, new/worsening/un-resolving symptoms, reviewed with patient at time of visit. Follow up and care instructions discussed and provided in AVS. - I discussed the assessment and treatment plan with the patient. The patient was provided an opportunity to ask questions and all were answered. The patient agreed with the plan and demonstrated an understanding of the instructions.  I provided 15+ minutes of non-face-to-face time during this encounter.  Delsa Grana, PA-C 11/10/20 4:09 PM

## 2020-11-12 ENCOUNTER — Telehealth: Payer: Self-pay | Admitting: Family Medicine

## 2020-11-12 ENCOUNTER — Ambulatory Visit: Payer: Self-pay | Admitting: *Deleted

## 2020-11-12 NOTE — Telephone Encounter (Signed)
Pt is calling to report that the nitrofurantoin, macrocrystal-monohydrate, (MACROBID) 100 MG capsule [[id number]] is making her sick - nausea, headache, anxiety  A clinical call was placed. Please advise

## 2020-11-12 NOTE — Telephone Encounter (Signed)
Pt is calling with a medication problem - pt reports that nitrofurantoin, macrocrystal-monohydrate, (MACROBID) 100 MG capsule [[id number]]  Is giving her anxiety, headaches, and shakes. NT not available.   Attempted to call patient- left message to call office.

## 2020-11-12 NOTE — Telephone Encounter (Signed)
Patient called, no answer, mailbox is full. Attempted x 3 to reach patient by [facility] NT, will route to the office for resolution.     Message from Luciana Axe sent at 11/12/2020  1:31 PM EDT  Pt is calling with a medication problem - pt reports that nitrofurantoin, macrocrystal-monohydrate, (MACROBID) 100 MG capsule [[id number]]  Is giving her anxiety, headaches, and shakes. NT not available.

## 2020-11-12 NOTE — Telephone Encounter (Signed)
Attempted to call patient- left message to call office

## 2020-11-13 ENCOUNTER — Ambulatory Visit: Payer: Self-pay

## 2020-11-13 NOTE — Telephone Encounter (Signed)
Answer Assessment - Initial Assessment Questions 1. NAME of MEDICATION: "What medicine are you calling about?"     Macrobid 2. QUESTION: "What is your question?" (e.g., double dose of medicine, side effect)     Causing vomiting, nausea, diarrhea 3. PRESCRIBING HCP: "Who prescribed it?" Reason: if prescribed by specialist, call should be referred to that group.     [place] 4. SYMPTOMS: "Do you have any symptoms?"     Yes 5. SEVERITY: If symptoms are present, ask "Are they mild, moderate or severe?"     Moderate 6. PREGNANCY:  "Is there any chance that you are pregnant?" "When was your last menstrual period?"     No  Protocols used: Medication Question Call-A-AH

## 2020-11-13 NOTE — Telephone Encounter (Signed)
Pt. Reports Macrobid that was prescribed has caused vomiting, nausea, diarrhea. Has stopped the medication. Requests different antibiotic be sent in.Rhonda in the practice reports there are no providers in the office today. Pt. Should go to UC. Pt. Verbalizes understanding.

## 2020-11-20 ENCOUNTER — Ambulatory Visit: Payer: Self-pay

## 2020-11-20 NOTE — Telephone Encounter (Signed)
She said Macrobid gave her vomiting and diarrhea

## 2020-11-20 NOTE — Telephone Encounter (Signed)
Patient was informed that after she had the virtual appointment on 6/21 she suppose to came in and left a urine specimen. I informed her today to come by and leave a specimen and she stated she had to work. I also informed her until she comes in and we have urine results we cannot treat her without a specimen. She can go to a UC if that is closer to her home.

## 2020-11-20 NOTE — Telephone Encounter (Signed)
Patient would like a new script called in to take over the place of the Mobile. Called patient and left message to see what kind of reaction she was having to medication to add to her allergy list and to inform the doctor so they can call in a new script.

## 2020-11-20 NOTE — Telephone Encounter (Signed)
Answer Assessment - Initial Assessment Questions 1. NAME of MEDICATION: "What medicine are you calling about?"     Macrobid 2. QUESTION: "What is your question?" (e.g., double dose of medicine, side effect)     Side effect 3. PRESCRIBING HCP: "Who prescribed it?" Reason: if prescribed by specialist, call should be referred to that group.     [place] 4. SYMPTOMS: "Do you have any symptoms?"     Vomiting, diarrhea 5. SEVERITY: If symptoms are present, ask "Are they mild, moderate or severe?"     Severe 6. PREGNANCY:  "Is there any chance that you are pregnant?" "When was your last menstrual period?"     No  Protocols used: Medication Question Call-A-AH

## 2020-11-20 NOTE — Telephone Encounter (Signed)
Pt. Has called twice about reaction to Macrobid. Requesting a different medication be sent to pharmacy. Please advise .

## 2021-01-06 ENCOUNTER — Other Ambulatory Visit: Payer: Self-pay

## 2021-01-06 ENCOUNTER — Encounter: Payer: Self-pay | Admitting: Family Medicine

## 2021-01-06 ENCOUNTER — Ambulatory Visit: Payer: Medicaid Other | Admitting: Family Medicine

## 2021-01-06 VITALS — BP 124/76 | HR 89 | Temp 98.1°F | Resp 18 | Ht 67.0 in | Wt 277.4 lb

## 2021-01-06 DIAGNOSIS — B379 Candidiasis, unspecified: Secondary | ICD-10-CM | POA: Diagnosis not present

## 2021-01-06 DIAGNOSIS — T3695XA Adverse effect of unspecified systemic antibiotic, initial encounter: Secondary | ICD-10-CM

## 2021-01-06 DIAGNOSIS — R350 Frequency of micturition: Secondary | ICD-10-CM | POA: Diagnosis not present

## 2021-01-06 LAB — POCT URINALYSIS DIPSTICK
Bilirubin, UA: NEGATIVE
Glucose, UA: NEGATIVE
Ketones, UA: NEGATIVE
Nitrite, UA: NEGATIVE
Protein, UA: POSITIVE — AB
Spec Grav, UA: 1.02 (ref 1.010–1.025)
Urobilinogen, UA: 0.2 E.U./dL
pH, UA: 5 (ref 5.0–8.0)

## 2021-01-06 MED ORDER — AMOXICILLIN-POT CLAVULANATE 875-125 MG PO TABS: 1.0000 | ORAL_TABLET | Freq: Two times a day (BID) | ORAL | 0 refills | Status: AC

## 2021-01-06 MED ORDER — FLUCONAZOLE 150 MG PO TABS: 150.0000 mg | ORAL_TABLET | ORAL | 0 refills | Status: DC | PRN

## 2021-01-06 NOTE — Progress Notes (Signed)
    SUBJECTIVE:   CHIEF COMPLAINT / HPI:   URINARY SYMPTOMS - lower back pain, frequent urine, darker urine, cloudy - couldn't tolerate macrobid, took a few doses  Dysuria: no Urinary frequency: yes Urgency: no Small volume voids:  sometimes Urinary incontinence: no Foul odor: yes Hematuria:  currently menstruating Abdominal pain: no Back pain: yes Suprapubic pain/pressure: yes Flank pain: yes Fever:  no Vomiting: no but with nausea Relief with cranberry juice:  hasn't tried Relief with pyridium:  hasn't tried Status: better/worse/stable Previous urinary tract infection: yes Recurrent urinary tract infection: no Sexual activity: monogomous History of sexually transmitted disease: no Vaginal discharge:  currently menstruating Treatments attempted:  macrobid, tylenol   OBJECTIVE:   BP 124/76   Pulse 89   Temp 98.1 F (36.7 C) (Oral)   Resp 18   Ht '5\' 7"'$  (1.702 m)   Wt 277 lb 6.4 oz (125.8 kg)   LMP 01/06/2021   SpO2 99%   BMI 43.45 kg/m   Gen: well appearing, in NAD Card: RRR Lungs: CTAB Abd: soft, NTND. No CVA tenderness.  Ext: WWP, no edema  ASSESSMENT/PLAN:   Urinary frequency UA and symptoms consistent with prior UTI. Intolerance to macrobid. Rx for augmentin sent given previous tolerance and susceptibility. Urine sent for culture, will treat as indicated. Return precautions discussed.    Myles Gip, DO

## 2021-01-06 NOTE — Patient Instructions (Signed)
It was great to see you!  Our plans for today:  - We sent an antibiotic to your pharmacy. Take the full prescription even if you start to feel better.  - If you develop vomiting, severe back pain, or worsened symptoms, come back to see [place].   We are checking some labs today, we will release these results to your MyChart.  Take care and seek immediate care sooner if you develop any concerns.   Dr. Ky Barban

## 2021-01-07 LAB — URINE CULTURE
MICRO NUMBER:: 12255797
SPECIMEN QUALITY:: ADEQUATE

## 2021-05-13 ENCOUNTER — Encounter: Payer: Self-pay | Admitting: Nurse Practitioner

## 2021-05-13 ENCOUNTER — Telehealth (INDEPENDENT_AMBULATORY_CARE_PROVIDER_SITE_OTHER): Payer: Medicaid Other | Admitting: Nurse Practitioner

## 2021-05-13 ENCOUNTER — Other Ambulatory Visit: Payer: Self-pay

## 2021-05-13 ENCOUNTER — Ambulatory Visit: Payer: Self-pay | Admitting: *Deleted

## 2021-05-13 VITALS — Temp 100.8°F

## 2021-05-13 DIAGNOSIS — U071 COVID-19: Secondary | ICD-10-CM

## 2021-05-13 MED ORDER — BENZONATATE 100 MG PO CAPS: 200.0000 mg | ORAL_CAPSULE | Freq: Three times a day (TID) | ORAL | 0 refills | Status: DC | PRN

## 2021-05-13 NOTE — Telephone Encounter (Signed)
Virtual appt scheduled with Serafina Royals for this afternoon

## 2021-05-13 NOTE — Progress Notes (Signed)
Name: Sherry Spence   MRN: [id number]    DOB: [birth year]   Date:05/13/2021       Progress Note  Subjective  Chief Complaint  Chief Complaint  Patient presents with   Covid Positive   I connected with  [name]  on 05/13/21 at 1545 pm  by a video enabled telemedicine application and verified that I am speaking with the correct person using two identifiers.  I discussed the limitations of evaluation and management by telemedicine and the availability of in person appointments. The patient expressed understanding and agreed to proceed with a virtual visit  Staff also discussed with the patient that there may be a patient responsible charge related to this service. Patient Location: home Provider Location: [facility] Additional Individuals present: alone  HPI  Covid- 19: She says her symptoms started Tuesday.  She tested positive for Covid on Wednesday.  Her symptoms include fever, cough, nasal congestion, body aches, loss of taste and smell.  She has been taking tylenol for her symptoms. She denies any shortness of breath or chest pain.  She says her nasal congestion is her biggest complaint.  She does not qualify for covid oral treatment due to not being high risk.  Discussed OTC treatments for symptoms.  Discussed pushing fluids, getting rest.  Discussed CDC guidelines for quarantine.    Patient Active Problem List   Diagnosis Date Noted   Microcytic anemia 01/14/2019   Seasonal allergic rhinitis 01/14/2019   BMI 40.0-44.9, adult ([place]) 03/18/2016   H/O chlamydia infection 02/28/2014   Gastroesophageal reflux disease without esophagitis 01/20/2014    Social History   Tobacco Use   Smoking status: Never   Smokeless tobacco: Never  Substance Use Topics   Alcohol use: No     Current Outpatient Medications:    cetirizine (ZYRTEC) 10 MG tablet, Take 1 tablet (10 mg total) by mouth daily., Disp: 30 tablet, Rfl: 11   cromolyn (OPTICROM) 4 % ophthalmic solution, PLACE 1  DROP INTO BOTH EYES 4 (FOUR) TIMES DAILY AS NEEDED (FOR ITCHY, WATERY OR SWOLLEN EYE SX)., Disp: 30 mL, Rfl: 2   Etonogestrel (NEXPLANON [place]), Inject into the skin., Disp: , Rfl:    fluticasone (FLONASE) 50 MCG/ACT nasal spray, Place 2 sprays into both nostrils daily., Disp: 16 g, Rfl: 11   pantoprazole (PROTONIX) 20 MG tablet, TAKE 1 TABLET (20 MG TOTAL) BY MOUTH EVERY MORNING. ONE HOUR BEFORE BREAKFAST, Disp: 30 tablet, Rfl: 1  Allergies  Allergen Reactions   Macrobid [Nitrofurantoin] Nausea Only    And dizziness   Compazine [Prochlorperazine Edisylate] Anxiety    I personally reviewed active problem list, medication list, allergies, notes from last encounter with the patient/caregiver today.  ROS  Constitutional: Positive for fever, negative for weight change.  HEENT: positive for nasal congestion Respiratory: Positive for cough and negative shortness of breath.   Cardiovascular: Negative for chest pain or palpitations.  Gastrointestinal: Negative for abdominal pain, no bowel changes.  Musculoskeletal: Negative for gait problem or joint swelling.  Skin: Negative for rash.  Neurological: Negative for dizziness or headache.  No other specific complaints in a complete review of systems (except as listed in HPI above).   Objective  Virtual encounter, vitals not obtained.  There is no height or weight on file to calculate BMI.  Nursing Note and Vital Signs reviewed.  Physical Exam  Awake, alert and oriented  No results found for this or any previous visit (from the past 52 hour(s)).  Assessment &  Plan  1. COVID-19 OTC treatments for symptoms Zinc, vitamin C and Vitamin D -push fluids, get rest -saline rinses for nasal congestion - benzonatate (TESSALON) 100 MG capsule; Take 2 capsules (200 mg total) by mouth 3 (three) times daily as needed for cough.  Dispense: 20 capsule; Refill: 0    -Red flags and when to present for emergency care or RTC including fever >101.64F,  chest pain, shortness of breath, new/worsening/un-resolving symptoms,  reviewed with patient at time of visit. Follow up and care instructions discussed and provided in AVS. - I discussed the assessment and treatment plan with the patient. The patient was provided an opportunity to ask questions and all were answered. The patient agreed with the plan and demonstrated an understanding of the instructions.  I provided 15 minutes of non-face-to-face time during this encounter.  Bo Merino, FNP

## 2021-05-13 NOTE — Telephone Encounter (Signed)
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

## 2021-05-13 NOTE — Telephone Encounter (Signed)
Reason for Disposition  [1] COVID-19 diagnosed by positive lab test (e.g., PCR, rapid self-test kit) AND [2] mild symptoms (e.g., cough, fever, others) AND [1] no complications or SOB  Answer Assessment - Initial Assessment Questions 1. COVID-19 DIAGNOSIS: "Who made your COVID-19 diagnosis?" "Was it confirmed by a positive lab test or self-test?" If not diagnosed by a doctor (or NP/PA), ask "Are there lots of cases (community spread) where you live?" Note: See public health department website, if unsure.     +COVID- home test- yesterday 2. COVID-19 EXPOSURE: "Was there any known exposure to COVID before the symptoms began?" CDC Definition of close contact: within 6 feet (2 meters) for a total of 15 minutes or more over a 24-hour period.      Daycare worker 3. ONSET: "When did the COVID-19 symptoms start?"      Tuesday night 4. WORST SYMPTOM: "What is your worst symptom?" (e.g., cough, fever, shortness of breath, muscle aches)     congestion 5. COUGH: "Do you have a cough?" If Yes, ask: "How bad is the cough?"       Yes- causing chest pain 6. FEVER: "Do you have a fever?" If Yes, ask: "What is your temperature, how was it measured, and when did it start?"     100.8- highest 102- under arm 7. RESPIRATORY STATUS: "Describe your breathing?" (e.g., shortness of breath, wheezing, unable to speak)      Today feels different- mild 8. BETTER-SAME-WORSE: "Are you getting better, staying the same or getting worse compared to yesterday?"  If getting worse, ask, "In what way?"     Worse- congestion worse- more symptoms 9. HIGH RISK DISEASE: "Do you have any chronic medical problems?" (e.g., asthma, heart or lung disease, weak immune system, obesity, etc.)     No- overweight 10. VACCINE: "Have you had the COVID-19 vaccine?" If Yes, ask: "Which one, how many shots, when did you get it?"       yes 11. BOOSTER: "Have you received your COVID-19 booster?" If Yes, ask: "Which one and when did you get it?"        no 12. PREGNANCY: "Is there any chance you are pregnant?" "When was your last menstrual period?"       No- LMP- 2 weeks ago 13. OTHER SYMPTOMS: "Do you have any other symptoms?"  (e.g., chills, fatigue, headache, loss of smell or taste, muscle pain, sore throat)       Body aches 14. O2 SATURATION MONITOR:  "Do you use an oxygen saturation monitor (pulse oximeter) at home?" If Yes, ask "What is your reading (oxygen level) today?" "What is your usual oxygen saturation reading?" (e.g., 95%)       no  Protocols used: Coronavirus (COVID-19) Diagnosed or Suspected-A-AH

## 2021-09-12 ENCOUNTER — Ambulatory Visit
Admission: EM | Admit: 2021-09-12 | Discharge: 2021-09-12 | Disposition: A | Discharge status: Home / Self Care | Payer: Medicaid Other | Attending: Emergency Medicine | Admitting: Emergency Medicine

## 2021-09-12 ENCOUNTER — Encounter: Payer: Self-pay | Admitting: Emergency Medicine

## 2021-09-12 DIAGNOSIS — L03313 Cellulitis of chest wall: Secondary | ICD-10-CM

## 2021-09-12 DIAGNOSIS — Z23 Encounter for immunization: Secondary | ICD-10-CM

## 2021-09-12 MED ORDER — TETANUS-DIPHTH-ACELL PERTUSSIS 5-2.5-18.5 LF-MCG/0.5 IM SUSY
0.5000 mL | PREFILLED_SYRINGE | Freq: Once | INTRAMUSCULAR | Status: AC
Start: 2021-09-12 — End: 2021-09-12
  Administered 2021-09-12: 0.5 mL via INTRAMUSCULAR

## 2021-09-12 MED ORDER — CEPHALEXIN 500 MG PO CAPS: 500.0000 mg | ORAL_CAPSULE | Freq: Four times a day (QID) | ORAL | 0 refills | Status: DC

## 2021-09-12 NOTE — Discharge Instructions (Addendum)
Take the antibiotic as directed.  Your tetanus was updated today.   ? ?Follow-up with your primary care provider in the next 1 to 2 days for recheck of your cellulitis.  Go to the emergency department if you have worsening symptoms. ?

## 2021-09-12 NOTE — ED Triage Notes (Signed)
Pt here with an area of erythema, warmth to right upper chest since this morning. Also c/o headache.  ?

## 2021-09-12 NOTE — ED Provider Notes (Signed)
?[facility] ? ? ? ?CSN: [id number] ?Arrival date & time: 09/12/21  1352 ? ? ?  ? ?History   ?Chief Complaint ?Chief Complaint  ?Patient presents with  ? Insect Bite  ? Rash  ? ? ?HPI ?Sherry Spence is a 31 y.o. female.  Patient presents with concern for possible spider bite on her chest.  She has 1 day history of tender, warm, red area on her right upper chest.  The area has developed couple of blisters in the center.  No open wounds or drainage.  She denies fever, chills, chest pain, shortness of breath, or other symptoms.  Last tetanus unknown. ? ?The history is provided by the patient.  ? ?Past Medical History:  ?Diagnosis Date  ? Allergy   ? Anxiety   ? BMI 40.0-44.9, adult ([place]) 03/18/2016  ? Cholelithiasis   ? GERD (gastroesophageal reflux disease) 01/20/2014  ? Headache   ? History of chlamydia infection   ? ? ?Patient Active Problem List  ? Diagnosis Date Noted  ? Microcytic anemia 01/14/2019  ? Seasonal allergic rhinitis 01/14/2019  ? BMI 40.0-44.9, adult ([place]) 03/18/2016  ? H/O chlamydia infection 02/28/2014  ? Gastroesophageal reflux disease without esophagitis 01/20/2014  ? ? ?Past Surgical History:  ?Procedure Laterality Date  ? NO PAST SURGERIES    ? ? ?OB History   ? ? Gravida  ?3  ? Para  ?3  ? Term  ?3  ? Preterm  ?0  ? AB  ?0  ? Living  ?3  ?  ? ? SAB  ?0  ? IAB  ?0  ? Ectopic  ?0  ? Multiple  ?0  ? Live Births  ?3  ?   ?  ?  ? ? ? ?Home Medications   ? ?Prior to Admission medications   ?Medication Sig Start Date End Date Taking? Authorizing Provider  ?cephALEXin (KEFLEX) 500 MG capsule Take 1 capsule (500 mg total) by mouth 4 (four) times daily. 09/12/21  Yes Sharion Balloon, NP  ?benzonatate (TESSALON) 100 MG capsule Take 2 capsules (200 mg total) by mouth 3 (three) times daily as needed for cough. 05/13/21   Bo Merino, FNP  ?cetirizine (ZYRTEC) 10 MG tablet Take 1 tablet (10 mg total) by mouth daily. 10/07/19   Delsa Grana, PA-C  ?cromolyn (OPTICROM) 4 % ophthalmic solution  PLACE 1 DROP INTO BOTH EYES 4 (FOUR) TIMES DAILY AS NEEDED (FOR ITCHY, WATERY OR SWOLLEN EYE SX). 12/03/19   Delsa Grana, PA-C  ?Etonogestrel (NEXPLANON [place]) Inject into the skin.    [provider]  ?fluticasone (FLONASE) 50 MCG/ACT nasal spray Place 2 sprays into both nostrils daily. 09/03/19   Delsa Grana, PA-C  ?pantoprazole (PROTONIX) 20 MG tablet TAKE 1 TABLET (20 MG TOTAL) BY MOUTH EVERY MORNING. ONE HOUR BEFORE BREAKFAST 08/26/19   Delsa Grana, PA-C  ? ? ?Family History ?Family History  ?Problem Relation Age of Onset  ? Hypertension Father   ? Diabetes Paternal Aunt   ? Cancer Maternal Grandmother   ? Diabetes Maternal Grandmother   ? Dementia Maternal Grandmother   ? Diabetes Paternal Grandmother   ? Hypertension Paternal Grandmother   ? Asthma Mother   ? Asthma Sister   ? Cancer Paternal Grandfather   ? Crohn's disease Sister   ? Asthma Sister   ? Mental retardation Neg Hx   ? ? ?Social History ?Social History  ? ?Tobacco Use  ? Smoking status: Never  ? Smokeless tobacco: Never  ?  Vaping Use  ? Vaping Use: Never used  ?Substance Use Topics  ? Alcohol use: No  ? Drug use: No  ? ? ? ?Allergies   ?Macrobid [nitrofurantoin] and Compazine [prochlorperazine edisylate] ? ? ?Review of Systems ?Review of Systems  ?Constitutional:  Negative for chills and fever.  ?Respiratory:  Negative for cough and shortness of breath.   ?Cardiovascular:  Negative for chest pain and palpitations.  ?Skin:  Positive for color change, rash and wound.  ?All other systems reviewed and are negative. ? ? ?Physical Exam ?Triage Vital Signs ?ED Triage Vitals [09/12/21 1404]  ?Enc Vitals Group  ?   BP 138/87  ?   Pulse Rate 96  ?   Resp 18  ?   Temp 98.4 ?F (36.9 ?C)  ?   Temp src   ?   SpO2 97 %  ?   Weight   ?   Height   ?   Head Circumference   ?   Peak Flow   ?   Pain Score   ?   Pain Loc   ?   Pain Edu?   ?   Excl. in [place]?   ? ?No data found. ? ?Updated Vital Signs ?BP 138/87   Pulse 96   Temp 98.4 ?F (36.9 ?C)   Resp 18   LMP  09/04/2021 (Approximate)   SpO2 97%  ? ?Visual Acuity ?Right Eye Distance:   ?Left Eye Distance:   ?Bilateral Distance:   ? ?Right Eye Near:   ?Left Eye Near:    ?Bilateral Near:    ? ?Physical Exam ?Vitals and nursing note reviewed.  ?Constitutional:   ?   General: She is not in acute distress. ?   Appearance: She is well-developed. She is obese. She is not ill-appearing.  ?HENT:  ?   Mouth/Throat:  ?   Mouth: Mucous membranes are moist.  ?Cardiovascular:  ?   Rate and Rhythm: Normal rate and regular rhythm.  ?   Heart sounds: Normal heart sounds.  ?Pulmonary:  ?   Effort: Pulmonary effort is normal. No respiratory distress.  ?   Breath sounds: Normal breath sounds.  ?Musculoskeletal:  ?   Cervical back: Neck supple.  ?Skin: ?   General: Skin is warm and dry.  ?   Findings: Erythema, lesion and rash present.  ?   Comments: Area of erythema with three central vesicles on right chest wall.  See pictures for details.  ?Neurological:  ?   Mental Status: She is alert.  ?Psychiatric:     ?   Mood and Affect: Mood normal.  ? ? ? ? ? ? ?UC Treatments / Results  ?Labs ?(all labs ordered are listed, but only abnormal results are displayed) ?Labs Reviewed - No data to display ? ?EKG ? ? ?Radiology ?No results found. ? ?Procedures ?Procedures (including critical care time) ? ?Medications Ordered in UC ?Medications  ?Tdap (BOOSTRIX) injection 0.5 mL (has no administration in time range)  ? ? ?Initial Impression / Assessment and Plan / UC Course  ?I have reviewed the triage vital signs and the nursing notes. ? ?Pertinent labs & imaging results that were available during my care of the patient were reviewed by me and considered in my medical decision making (see chart for details). ? ?  ?Cellulitis of chest wall.  Tetanus updated today.  Treating with cephalexin.  Instructed her to follow-up with her PCP in 1 to 2 days for recheck of the area.  ED precautions discussed.  Education provided on cellulitis.  Patient agrees to plan  of care. ? ? ? ?Final Clinical Impressions(s) / UC Diagnoses  ? ?Final diagnoses:  ?Cellulitis of chest wall  ? ? ? ?Discharge Instructions   ? ?  ?Take the antibiotic as directed.  Your tetanus was updated today.   ? ?Follow-up with your primary care provider in the next 1 to 2 days for recheck of your cellulitis.  Go to the emergency department if you have worsening symptoms. ? ? ? ? ?ED Prescriptions   ? ? Medication Sig Dispense Auth. Provider  ? cephALEXin (KEFLEX) 500 MG capsule Take 1 capsule (500 mg total) by mouth 4 (four) times daily. 28 capsule Sharion Balloon, NP  ? ?  ? ?PDMP not reviewed this encounter. ?  ?Sharion Balloon, NP ?09/12/21 1443 ? ?

## 2021-10-03 ENCOUNTER — Ambulatory Visit
Admission: EM | Admit: 2021-10-03 | Discharge: 2021-10-03 | Disposition: A | Discharge status: Home / Self Care | Payer: Medicaid Other | Attending: Internal Medicine | Admitting: Internal Medicine

## 2021-10-03 DIAGNOSIS — J069 Acute upper respiratory infection, unspecified: Secondary | ICD-10-CM

## 2021-10-03 MED ORDER — ALBUTEROL SULFATE HFA 108 (90 BASE) MCG/ACT IN AERS: 2.0000 | INHALATION_SPRAY | RESPIRATORY_TRACT | 0 refills | Status: DC | PRN

## 2021-10-03 MED ORDER — PSEUDOEPHEDRINE HCL ER 120 MG PO TB12: 120.0000 mg | ORAL_TABLET | Freq: Two times a day (BID) | ORAL | 0 refills | Status: DC

## 2021-10-03 NOTE — ED Provider Notes (Signed)
?[facility] ? ? ? ?CSN: [id number] ?Arrival date & time: 10/03/21  1246 ? ? ?  ? ?History   ?Chief Complaint ?Chief Complaint  ?Patient presents with  ? Sore Throat  ? Headache  ? Cough  ? Shortness of Breath  ? Nasal Congestion  ? ? ?HPI ?Sherry Spence is a 31 y.o. female who presents with HA, hoarseness nose congestion x 2 days, but started getting SOB last night and noticed herself wheezing. Her nose was also really stuffy. She is not a smoker. Denies fever ro body aches.  ? ? ? ?Past Medical History:  ?Diagnosis Date  ? Allergy   ? Anxiety   ? BMI 40.0-44.9, adult ([place]) 03/18/2016  ? Cholelithiasis   ? GERD (gastroesophageal reflux disease) 01/20/2014  ? Headache   ? History of chlamydia infection   ? ? ?Patient Active Problem List  ? Diagnosis Date Noted  ? Microcytic anemia 01/14/2019  ? Seasonal allergic rhinitis 01/14/2019  ? BMI 40.0-44.9, adult ([place]) 03/18/2016  ? H/O chlamydia infection 02/28/2014  ? Gastroesophageal reflux disease without esophagitis 01/20/2014  ? ? ?Past Surgical History:  ?Procedure Laterality Date  ? NO PAST SURGERIES    ? ? ?OB History   ? ? Gravida  ?3  ? Para  ?3  ? Term  ?3  ? Preterm  ?0  ? AB  ?0  ? Living  ?3  ?  ? ? SAB  ?0  ? IAB  ?0  ? Ectopic  ?0  ? Multiple  ?0  ? Live Births  ?3  ?   ?  ?  ? ? ? ?Home Medications   ? ?Prior to Admission medications   ?Medication Sig Start Date End Date Taking? Authorizing Provider  ?albuterol (VENTOLIN HFA) 108 (90 Base) MCG/ACT inhaler Inhale 2 puffs into the lungs every 4 (four) hours as needed for wheezing or shortness of breath. 10/03/21  Yes Rodriguez-Southworth, Sunday Spillers, PA-C  ?pseudoephedrine (SUDAFED 12 HOUR) 120 MG 12 hr tablet Take 1 tablet (120 mg total) by mouth 2 (two) times daily. 10/03/21  Yes Rodriguez-Southworth, Sunday Spillers, PA-C  ?benzonatate (TESSALON) 100 MG capsule Take 2 capsules (200 mg total) by mouth 3 (three) times daily as needed for cough. 05/13/21   Bo Merino, FNP  ?cephALEXin (KEFLEX) 500 MG  capsule Take 1 capsule (500 mg total) by mouth 4 (four) times daily. 09/12/21   Sharion Balloon, NP  ?cetirizine (ZYRTEC) 10 MG tablet Take 1 tablet (10 mg total) by mouth daily. 10/07/19   Delsa Grana, PA-C  ?cromolyn (OPTICROM) 4 % ophthalmic solution PLACE 1 DROP INTO BOTH EYES 4 (FOUR) TIMES DAILY AS NEEDED (FOR ITCHY, WATERY OR SWOLLEN EYE SX). 12/03/19   Delsa Grana, PA-C  ?Etonogestrel (NEXPLANON [place]) Inject into the skin.    [provider]  ?fluticasone (FLONASE) 50 MCG/ACT nasal spray Place 2 sprays into both nostrils daily. 09/03/19   Delsa Grana, PA-C  ?pantoprazole (PROTONIX) 20 MG tablet TAKE 1 TABLET (20 MG TOTAL) BY MOUTH EVERY MORNING. ONE HOUR BEFORE BREAKFAST 08/26/19   Delsa Grana, PA-C  ? ? ?Family History ?Family History  ?Problem Relation Age of Onset  ? Hypertension Father   ? Diabetes Paternal Aunt   ? Cancer Maternal Grandmother   ? Diabetes Maternal Grandmother   ? Dementia Maternal Grandmother   ? Diabetes Paternal Grandmother   ? Hypertension Paternal Grandmother   ? Asthma Mother   ? Asthma Sister   ? Cancer Paternal  Grandfather   ? Crohn's disease Sister   ? Asthma Sister   ? Mental retardation Neg Hx   ? ? ?Social History ?Social History  ? ?Tobacco Use  ? Smoking status: Never  ? Smokeless tobacco: Never  ?Vaping Use  ? Vaping Use: Never used  ?Substance Use Topics  ? Alcohol use: No  ? Drug use: No  ? ? ? ?Allergies   ?Macrobid [nitrofurantoin] and Compazine [prochlorperazine edisylate] ? ? ?Review of Systems ?Review of Systems  ?Constitutional:  Positive for appetite change and fatigue. Negative for chills, diaphoresis and fever.  ?HENT:  Positive for congestion, postnasal drip, rhinorrhea and voice change. Negative for sore throat.   ?Respiratory:  Positive for cough and shortness of breath.   ?Cardiovascular:  Positive for chest pain.  ?Musculoskeletal:  Negative for myalgias.  ?Neurological:  Positive for headaches.  ? ? ?Physical Exam ?Triage Vital Signs ?ED Triage Vitals   ?Enc Vitals Group  ?   BP 10/03/21 1416 122/89  ?   Pulse Rate 10/03/21 1416 86  ?   Resp 10/03/21 1416 18  ?   Temp 10/03/21 1416 98.9 ?F (37.2 ?C)  ?   Temp Source 10/03/21 1416 Oral  ?   SpO2 10/03/21 1416 98 %  ?   Weight --   ?   Height --   ?   Head Circumference --   ?   Peak Flow --   ?   Pain Score 10/03/21 1411 8  ?   Pain Loc --   ?   Pain Edu? --   ?   Excl. in [place]? --   ? ?No data found. ? ?Updated Vital Signs ?BP 122/89 (BP Location: Left Arm)   Pulse 86   Temp 98.9 ?F (37.2 ?C) (Oral)   Resp 18   LMP 09/04/2021 (Approximate)   SpO2 98%  ? ?Visual Acuity ?Right Eye Distance:   ?Left Eye Distance:   ?Bilateral Distance:   ? ?Right Eye Near:   ?Left Eye Near:    ?Bilateral Near:    ? ? ?Physical Exam ?Vitals signs and nursing note reviewed.  ?Constitutional:   ?   General: She is not in acute distress. ?   Appearance: Normal appearance. She is not ill-appearing, toxic-appearing or diaphoretic.  ?HENT:  ?   Head: Normocephalic.  ?   Right Ear: Tympanic membrane, ear canal and external ear normal.  ?   Left Ear: Tympanic membrane, ear canal and external ear normal.  ?   Nose: Nose normal.  ?   Mouth/Throat:  ?   Mouth: Mucous membranes are moist.  ?Eyes:  ?   General: No scleral icterus.    ?   Right eye: No discharge.     ?   Left eye: No discharge.  ?   Conjunctiva/sclera: Conjunctivae normal.  ?Neck:  ?   Musculoskeletal: Neck supple. No neck rigidity.  ?Cardiovascular:  ?   Rate and Rhythm: Normal rate and regular rhythm.  ?   Heart sounds: No murmur.  ?Pulmonary:  ?   Effort: Pulmonary effort is normal.  ?   Breath sounds: Normal breath sounds.  ?   I was able to reproduce the chest tenderness she has been feeling with palpation on central region.  ?Musculoskeletal: Normal range of motion.  ?Lymphadenopathy:  ?   Cervical: No cervical adenopathy.  ?Skin: ?   General: Skin is warm and dry.  ?   Coloration: Skin is not jaundiced.  ?  Findings: No rash.  ?Neurological:  ?   Mental Status: She is  alert and oriented to person, place, and time.  ?   Gait: Gait normal.  ?Psychiatric:     ?   Mood and Affect: Mood normal.     ?   Behavior: Behavior normal.     ?   Thought Content: Thought content normal.     ?   Judgment: Judgment normal.  ? ? ?UC Treatments / Results  ?Labs ?(all labs ordered are listed, but only abnormal results are displayed) ?Labs Reviewed - No data to display ? ?EKG ? ? ?Radiology ?No results found. ? ?Procedures ?Procedures (including critical care time) ? ?Medications Ordered in UC ?Medications - No data to display ? ?Initial Impression / Assessment and Plan / UC Course  ?I have reviewed the triage vital signs and the nursing notes. ? ?URI with possible allergic bronchitis ?Costochondritis ? ?I placed her on Sudafed and Albuterol inhaler.  ? ? ? ? ?Final Clinical Impressions(s) / UC Diagnoses  ? ?Final diagnoses:  ?Acute upper respiratory infection  ? ? ? ?Discharge Instructions   ? ?  ?You may take Aleve for chest discomfort ? ? ? ?ED Prescriptions   ? ? Medication Sig Dispense Auth. Provider  ? albuterol (VENTOLIN HFA) 108 (90 Base) MCG/ACT inhaler Inhale 2 puffs into the lungs every 4 (four) hours as needed for wheezing or shortness of breath. 18 g Rodriguez-Southworth, Sunday Spillers, PA-C  ? pseudoephedrine (SUDAFED 12 HOUR) 120 MG 12 hr tablet Take 1 tablet (120 mg total) by mouth 2 (two) times daily. 14 tablet Rodriguez-Southworth, Sunday Spillers, PA-C  ? ?  ? ?PDMP not reviewed this encounter. ?  ?Shelby Mattocks, PA-C ?10/03/21 1443 ? ?

## 2021-10-03 NOTE — Discharge Instructions (Addendum)
You may take Aleve for chest discomfort ?

## 2021-10-03 NOTE — ED Triage Notes (Signed)
Patient presents to Urgent Care with complaints of sore throat, HA, SOB since Friday. Treating symptoms with tylenol, allergy meds, and motrin.  ?

## 2022-03-28 ENCOUNTER — Ambulatory Visit: Payer: Medicaid Other | Admitting: Family Medicine

## 2022-05-30 ENCOUNTER — Ambulatory Visit (INDEPENDENT_AMBULATORY_CARE_PROVIDER_SITE_OTHER): Payer: Medicaid Other | Admitting: Family Medicine

## 2022-05-30 ENCOUNTER — Encounter: Payer: Self-pay | Admitting: Family Medicine

## 2022-05-30 VITALS — BP 134/82 | HR 84 | Temp 97.8°F | Resp 16 | Ht 65.0 in | Wt 280.0 lb

## 2022-05-30 DIAGNOSIS — L988 Other specified disorders of the skin and subcutaneous tissue: Secondary | ICD-10-CM | POA: Diagnosis not present

## 2022-05-30 DIAGNOSIS — Z124 Encounter for screening for malignant neoplasm of cervix: Secondary | ICD-10-CM | POA: Diagnosis not present

## 2022-05-30 DIAGNOSIS — Z0289 Encounter for other administrative examinations: Secondary | ICD-10-CM

## 2022-05-30 DIAGNOSIS — Z1159 Encounter for screening for other viral diseases: Secondary | ICD-10-CM

## 2022-05-30 DIAGNOSIS — R1013 Epigastric pain: Secondary | ICD-10-CM

## 2022-05-30 DIAGNOSIS — L918 Other hypertrophic disorders of the skin: Secondary | ICD-10-CM

## 2022-05-30 DIAGNOSIS — Z0000 Encounter for general adult medical examination without abnormal findings: Secondary | ICD-10-CM

## 2022-05-30 DIAGNOSIS — Z23 Encounter for immunization: Secondary | ICD-10-CM | POA: Diagnosis not present

## 2022-05-30 DIAGNOSIS — Z111 Encounter for screening for respiratory tuberculosis: Secondary | ICD-10-CM | POA: Diagnosis not present

## 2022-05-30 DIAGNOSIS — Z6841 Body Mass Index (BMI) 40.0 and over, adult: Secondary | ICD-10-CM

## 2022-05-30 MED ORDER — PANTOPRAZOLE SODIUM 20 MG PO TBEC: 20.0000 mg | DELAYED_RELEASE_TABLET | ORAL | 1 refills | Status: DC

## 2022-05-30 NOTE — Progress Notes (Signed)
Patient: Sherry Spence, Female    DOB: Feb 27, 1991, 32 y.o.   MRN: [id number] Delsa Grana, PA-C Visit Date: 05/30/2022  Today's Provider: Delsa Grana, PA-C   Chief Complaint  Patient presents with   Annual Exam   Subjective:   Annual physical exam:  Sherry Spence is a 32 y.o. female who presents today for complete physical exam:  Exercise/Activity:  1 d a week Diet/nutrition:  no current diet efforts Sleep:   no concerns  SDOH Screenings   Food Insecurity: No Food Insecurity (05/30/2022)  Housing: Low Risk  (05/30/2022)  Transportation Needs: No Transportation Needs (05/30/2022)  Utilities: Not At Risk (05/30/2022)  Alcohol Screen: Low Risk  (05/30/2022)  Depression (PHQ2-9): Low Risk  (05/30/2022)  Financial Resource Strain: Low Risk  (05/30/2022)  Physical Activity: Insufficiently Active (05/30/2022)  Social Connections: Socially Integrated (05/30/2022)  Stress: No Stress Concern Present (05/30/2022)  Tobacco Use: Low Risk  (05/30/2022)     Pt wished to discuss acute complaints  Abd pain with eating and right after - in the pit of her stomach, getting worse, told previously she had gallstones, she take tylenol for the pain, few episodes of worse pain with N/V that she thought was a stomach bug   USPSTF grade A and B recommendations - reviewed and addressed today  Depression:  Phq 9 completed today by patient, was reviewed by me with patient in the room PHQ score is neg, pt feels mood is good    05/30/2022    2:44 PM 05/13/2021    8:39 AM 01/06/2021    1:14 PM 11/10/2020    3:48 PM  PHQ 2/9 Scores  PHQ - 2 Score 0 0 0 0  PHQ- 9 Score 0         05/30/2022    2:44 PM 05/13/2021    8:39 AM 01/06/2021    1:14 PM 11/10/2020    3:48 PM 03/09/2020    1:35 PM  Depression screen PHQ 2/9  Decreased Interest 0 0 0 0 0  Down, Depressed, Hopeless 0 0 0 0 0  PHQ - 2 Score 0 0 0 0 0  Altered sleeping 0      Tired, decreased energy 0      Change in appetite 0      Feeling bad or  failure about yourself  0      Trouble concentrating 0      Moving slowly or fidgety/restless 0      Suicidal thoughts 0      PHQ-9 Score 0      Difficult doing work/chores Not difficult at all        Alcohol screening: [place] Office Visit from 05/30/2022 in [facility]  AUDIT-C Score 0       Immunizations and Health Maintenance: Health Maintenance  Topic Date Due   Hepatitis C Screening  Never done   PAP SMEAR-Modifier  05/30/2022 (Originally 11/29/2019)   COVID-19 Vaccine (2 - 2023-24 season) 06/15/2022 (Originally 01/21/2022)   INFLUENZA VACCINE  08/21/2022 (Originally 12/21/2021)   DTaP/Tdap/Td (3 - Td or Tdap) 09/13/2031   HIV Screening  Completed   HPV VACCINES  Aged Out     Hep C Screening: due  STD testing and prevention (HIV/chl/gon/syphilis):  see above, no additional testing desired by pt today - declined  Intimate partner violence:safe   Sexual History/Pain during Intercourse: Married, no concerns   Menstrual History/LMP/Abnormal Bleeding:  No LMP recorded.  Spotting occasionally with  nexplanon  Incontinence Symptoms: none  Breast cancer:  not due today per age and hx Last Mammogram: *see HM list above BRCA gene screening: none Maternal grandmother   Cervical cancer screening: due - refuses today -will go to OB Pt  family hx of cancers - breast, ovarian, uterine, colon:     Osteoporosis:   Discussion on osteoporosis per age, including high calcium and vitamin D supplementation, weight bearing exercises  Skin cancer:  Hx of skin CA -  NO Discussed atypical lesions  Some skin tags to AC, thin skin that tears to [facility]  Colorectal cancer:   Colonoscopy is not due per age Discussed concerning signs and sx of CRC, pt denies change in bowels  Lung cancer:   Low Dose CT Chest recommended if Age 70-80 years, 20 pack-year currently smoking OR have quit w/in 15years. Patient does not qualify.    Social History   Tobacco Use   Smoking  status: Never   Smokeless tobacco: Never  Vaping Use   Vaping Use: Never used  Substance Use Topics   Alcohol use: No   Drug use: No     Flowsheet Row Office Visit from [facility]/30/2022 in [facility]  AUDIT-C Score 0       Family History  Problem Relation Age of Onset   Hypertension Father    Diabetes Paternal Aunt    Cancer Maternal Grandmother    Diabetes Maternal Grandmother    Dementia Maternal Grandmother    Diabetes Paternal Grandmother    Hypertension Paternal Grandmother    Asthma Mother    Asthma Sister    Cancer Paternal Grandfather    Crohn's disease Sister    Asthma Sister    Mental retardation Neg Hx      Blood pressure/Hypertension: BP Readings from Last 3 Encounters:  05/30/22 134/82  10/03/21 122/89  09/12/21 138/87    Weight/Obesity: Wt Readings from Last 3 Encounters:  05/30/22 280 lb (127 kg)  01/06/21 277 lb 6.4 oz (125.8 kg)  11/10/20 282 lb (127.9 kg)   BMI Readings from Last 3 Encounters:  05/30/22 46.59 kg/m  01/06/21 43.45 kg/m  11/10/20 44.17 kg/m     Lipids:  Lab Results  Component Value Date   CHOL 133 01/15/2019   Lab Results  Component Value Date   HDL 37 (L) 01/15/2019   Lab Results  Component Value Date   LDLCALC 82 01/15/2019   Lab Results  Component Value Date   TRIG 67 01/15/2019   Lab Results  Component Value Date   CHOLHDL 3.6 01/15/2019   No results found for: "LDLDIRECT" Based on the results of lipid panel his/her cardiovascular risk factor ( using [employer] )  in the next 10 years is: The ASCVD Risk score (Arnett DK, et al., 2019) failed to calculate for the following reasons:   The 2019 ASCVD risk score is only valid for ages 46 to 23  Glucose:  Glucose, Bld  Date Value Ref Range Status  03/23/2019 118 (H) 70 - 99 mg/dL Final  01/15/2019 90 65 - 99 mg/dL Final    Comment:    .            Fasting reference interval .   08/21/2018 92 70 - 99 mg/dL Final    Advanced Care  Planning:  A voluntary discussion about advance care planning including the explanation and discussion of advance directives.   Discussed health care proxy and Living will, and the patient was able  to identify a health care proxy as spouse.   Patient does not have a living will at present time.   Social History       Social History   Socioeconomic History   Marital status: Married    Spouse name: Not on file   Number of children: 3   Years of education: 12   Highest education level: Associate degree: occupational, Hotel manager, or vocational program  Occupational History   Not on file  Tobacco Use   Smoking status: Never   Smokeless tobacco: Never  Vaping Use   Vaping Use: Never used  Substance and Sexual Activity   Alcohol use: No   Drug use: No   Sexual activity: Yes    Birth control/protection: Implant  Other Topics Concern   Not on file  Social History Narrative   Getting Married Sept 20th (? Or close to that) 2020 with fiance of several years   Three children ages 71, 2 and [place]   Social Determinants of Health   Financial Resource Strain: Low Risk  (05/30/2022)   Overall Financial Resource Strain (CARDIA)    Difficulty of Paying Living Expenses: Not hard at all  Food Insecurity: No Food Insecurity (05/30/2022)   Hunger Vital Sign    Worried About Running Out of Food in the Last Year: Never true    [place] in the Last Year: Never true  Transportation Needs: No Transportation Needs (05/30/2022)   PRAPARE - Hydrologist (Medical): No    Lack of Transportation (Non-Medical): No  Physical Activity: Insufficiently Active (05/30/2022)   Exercise Vital Sign    Days of Exercise per Week: 1 day    Minutes of Exercise per Session: 20 min  Stress: No Stress Concern Present (05/30/2022)   [place]    Feeling of Stress : Only  a little  Social Connections: Socially Integrated (05/30/2022)   Social Connection and Isolation Panel [NHANES]    Frequency of Communication with Friends and Family: More than three times a week    Frequency of Social Gatherings with Friends and Family: More than three times a week    Attends Religious Services: More than 4 times per year    Active Member of Clubs or Organizations: No    Attends Music therapist: 1 to 4 times per year    Marital Status: Married    Family History        Family History  Problem Relation Age of Onset   Hypertension Father    Diabetes Paternal Aunt    Cancer Maternal Grandmother    Diabetes Maternal Grandmother    Dementia Maternal Grandmother    Diabetes Paternal Grandmother    Hypertension Paternal Grandmother    Asthma Mother    Asthma Sister    Cancer Paternal Grandfather    Crohn's disease Sister    Asthma Sister    Mental retardation Neg Hx     Patient Active Problem List   Diagnosis Date Noted   Microcytic anemia 01/14/2019   Seasonal allergic rhinitis 01/14/2019   BMI 40.0-44.9, adult ([place]) 03/18/2016   H/O chlamydia infection 02/28/2014   Gastroesophageal reflux disease without esophagitis 01/20/2014    Past Surgical History:  Procedure Laterality Date   NO PAST SURGERIES       Current Outpatient Medications:  albuterol (VENTOLIN HFA) 108 (90 Base) MCG/ACT inhaler, Inhale 2 puffs into the lungs every 4 (four) hours as needed for wheezing or shortness of breath., Disp: 18 g, Rfl: 0   cephALEXin (KEFLEX) 500 MG capsule, Take 1 capsule (500 mg total) by mouth 4 (four) times daily., Disp: 28 capsule, Rfl: 0   cetirizine (ZYRTEC) 10 MG tablet, Take 1 tablet (10 mg total) by mouth daily., Disp: 30 tablet, Rfl: 11   cromolyn (OPTICROM) 4 % ophthalmic solution, PLACE 1 DROP INTO BOTH EYES 4 (FOUR) TIMES DAILY AS NEEDED (FOR ITCHY, WATERY OR SWOLLEN EYE SX)., Disp: 30 mL, Rfl: 2   Etonogestrel (NEXPLANON [place]), Inject into  the skin., Disp: , Rfl:    fluticasone (FLONASE) 50 MCG/ACT nasal spray, Place 2 sprays into both nostrils daily., Disp: 16 g, Rfl: 11   benzonatate (TESSALON) 100 MG capsule, Take 2 capsules (200 mg total) by mouth 3 (three) times daily as needed for cough. (Patient not taking: Reported on 05/30/2022), Disp: 20 capsule, Rfl: 0   pantoprazole (PROTONIX) 20 MG tablet, TAKE 1 TABLET (20 MG TOTAL) BY MOUTH EVERY MORNING. ONE HOUR BEFORE BREAKFAST (Patient not taking: Reported on 05/30/2022), Disp: 30 tablet, Rfl: 1   pseudoephedrine (SUDAFED 12 HOUR) 120 MG 12 hr tablet, Take 1 tablet (120 mg total) by mouth 2 (two) times daily. (Patient not taking: Reported on 05/30/2022), Disp: 14 tablet, Rfl: 0  Allergies  Allergen Reactions   Macrobid [Nitrofurantoin] Nausea Only    And dizziness   Compazine [Prochlorperazine Edisylate] Anxiety    Patient Care Team: Delsa Grana, PA-C as PCP - General (Family Medicine)   Chart Review: I personally reviewed active problem list, medication list, allergies, family history, social history, health maintenance, notes from last encounter, lab results, imaging with the patient/caregiver today.   Review of Systems  Constitutional: Negative.   HENT: Negative.    Eyes: Negative.   Respiratory: Negative.    Cardiovascular: Negative.   Gastrointestinal: Negative.   Endocrine: Negative.   Genitourinary: Negative.   Musculoskeletal: Negative.   Skin: Negative.   Allergic/Immunologic: Negative.   Neurological: Negative.   Hematological: Negative.   Psychiatric/Behavioral: Negative.    All other systems reviewed and are negative.         Objective:   Vitals:  Vitals:   05/30/22 1454  BP: 134/82  Pulse: 84  Resp: 16  Temp: 97.8 F (36.6 C)  TempSrc: Oral  SpO2: 98%  Weight: 280 lb (127 kg)  Height: '5\' 5"'$  (1.651 m)    Body mass index is 46.59 kg/m.  Physical Exam Vitals and nursing note reviewed.  Constitutional:      General: She is not in  acute distress.    Appearance: Normal appearance. She is well-developed. She is obese. She is not ill-appearing, toxic-appearing or diaphoretic.  HENT:     Head: Normocephalic and atraumatic.     Right Ear: Tympanic membrane, ear canal and external ear normal. There is no impacted cerumen.     Left Ear: Tympanic membrane, ear canal and external ear normal. There is no impacted cerumen.     Nose: Congestion present. No rhinorrhea.     Mouth/Throat:     Mouth: Mucous membranes are moist.     Pharynx: Oropharynx is clear. Uvula midline. No oropharyngeal exudate or posterior oropharyngeal erythema.  Eyes:     General: Lids are normal. No scleral icterus.       Right eye: No discharge.  Left eye: No discharge.     Conjunctiva/sclera: Conjunctivae normal.  Neck:     Trachea: Phonation normal. No tracheal deviation.  Cardiovascular:     Rate and Rhythm: Normal rate and regular rhythm.     Pulses: Normal pulses.          Radial pulses are 2+ on the right side and 2+ on the left side.       Posterior tibial pulses are 2+ on the right side and 2+ on the left side.     Heart sounds: Normal heart sounds. No murmur heard.    No friction rub. No gallop.  Pulmonary:     Effort: Pulmonary effort is normal. No respiratory distress.     Breath sounds: Normal breath sounds. No stridor. No wheezing, rhonchi or rales.  Chest:     Chest wall: No tenderness.  Abdominal:     General: Bowel sounds are normal.     Palpations: Abdomen is soft.  Musculoskeletal:     Cervical back: Normal range of motion.  Lymphadenopathy:     Cervical: No cervical adenopathy.  Skin:    General: Skin is warm and dry.     Capillary Refill: Capillary refill takes less than 2 seconds.     Coloration: Skin is not pale.     Findings: No rash.     Comments: Few soft small skin tags to right AC fossa, one to left neck  Hypopigmented lesion to left breast, flat, no tenderness, roughly 2x69m   Neurological:     Mental  Status: She is alert and oriented to person, place, and time.     Motor: No abnormal muscle tone.     Gait: Gait normal.  Psychiatric:        Mood and Affect: Mood normal.        Speech: Speech normal.        Behavior: Behavior normal.       Fall Risk:    05/30/2022    2:44 PM 05/13/2021    8:39 AM 01/06/2021    1:13 PM 11/10/2020    3:48 PM 03/09/2020    1:34 PM  Fall Risk   Falls in the past year? 0 0 0 0 0  Number falls in past yr: 0 0 0 0 0  Injury with Fall? 0 0 0 0 0  Risk for fall due to : No Fall Risks      Follow up Falls prevention discussed;Falls evaluation completed;Education provided Falls evaluation completed Falls evaluation completed Falls prevention discussed Falls evaluation completed    Functional Status Survey: Is the patient deaf or have difficulty hearing?: No Does the patient have difficulty seeing, even when wearing glasses/contacts?: No Does the patient have difficulty concentrating, remembering, or making decisions?: No Does the patient have difficulty walking or climbing stairs?: No Does the patient have difficulty dressing or bathing?: No Does the patient have difficulty doing errands alone such as visiting a doctor's office or shopping?: No   Assessment & Plan:    CPE completed today  USPSTF grade A and B recommendations reviewed with patient; age-appropriate recommendations, preventive care, screening tests, etc discussed and encouraged; healthy living encouraged; see AVS for patient education given to patient  Discussed importance of 150 minutes of physical activity weekly, AHA exercise recommendations given to pt in AVS/handout  Discussed importance of healthy diet:  eating lean meats and proteins, avoiding trans fats and saturated fats, avoid simple sugars and excessive carbs in diet, eat 6  servings of fruit/vegetables daily and drink plenty of water and avoid sweet beverages.    Recommended pt to do annual eye exam and routine dental  exams/cleanings  Depression, alcohol, fall screening completed as documented above and per flowsheets  Advance Care planning information and packet discussed and offered today, encouraged pt to discuss with family members/spouse/partner/friends and complete Advanced directive packet and bring copy to office   Reviewed Health Maintenance: Health Maintenance  Topic Date Due   Hepatitis C Screening  Never done   PAP SMEAR-Modifier  05/30/2022 (Originally 11/29/2019)   COVID-19 Vaccine (2 - 2023-24 season) 06/15/2022 (Originally 01/21/2022)   INFLUENZA VACCINE  08/21/2022 (Originally 12/21/2021)   DTaP/Tdap/Td (3 - Td or Tdap) 09/13/2031   HIV Screening  Completed   HPV VACCINES  Aged Out    Immunizations: Immunization History  Administered Date(s) Administered   Influenza-Unspecified 04/05/2016   PFIZER(Purple Top)SARS-COV-2 Vaccination 05/29/2020   PPD Test 12/22/2013, 07/22/2015   Tdap 03/18/2016, 09/12/2021   Vaccines:   HPV: up to at age 49 , ask insurance if age between 45-45  Shingrix: 56-64 yo and ask insurance if covered when patient above 79 yo Pneumonia:  educated and discussed with patient. Flu:  educated and discussed with patient. COVID:      ICD-10-CM   1. Adult general medical exam  Z00.00 CBC with Differential/Platelet    COMPLETE METABOLIC PANEL WITH GFR    2. Encounter for hepatitis C screening test for low risk patient  Z11.59 Hepatitis C antibody    3. Screening for malignant neoplasm of cervix  Z12.4    overdue, pt declined to do today, she will go to OBGYN to do    4. Encounter for physical examination related to employment  Z02.89 QuantiFERON-TB Gold Plus    5. Screening for tuberculosis  Z11.1 QuantiFERON-TB Gold Plus    6. Encounter for completion of form with patient  Z02.89    forms for employment and eval today completed, only one lab pending results for TB screening    7. Need for influenza vaccination  Z23     8. Epigastric pain  R10.13  pantoprazole (PROTONIX) 20 MG tablet    US Abdomen Limited RUQ (LIVER/GB)   after eating, told previously she has gallstones, she would like this checked, RUQ limited US and restart protonix, avoid fatty fried foods    9. Skin tags, multiple acquired  L91.8 Ambulatory referral to Dermatology   to right [facility] fossa and neck - req derm referral    10. Skin lesion of breast  L98.8 Ambulatory referral to Dermatology   small flat hypopigmented lesion very small 2-3 cm in diameter to left breast    11. Class 3 severe obesity with body mass index (BMI) of 45.0 to 49.9 in adult, unspecified obesity type, unspecified whether serious comorbidity present [facility])  E66.01    Z68.42           Delsa Grana, PA-C 05/30/22 3:07 PM  [facility]

## 2022-05-30 NOTE — Patient Instructions (Addendum)
Health Maintenance  Topic Date Due   Hepatitis C Screening: USPSTF Recommendation to screen - Ages 67-34 yo.  Never done   Pap Smear  05/30/2022*   COVID-19 Vaccine (2 - 2023-24 season) 06/15/2022*   Flu Shot  08/21/2022*   DTaP/Tdap/Td vaccine (3 - Td or Tdap) 09/13/2031   HIV Screening  Completed   HPV Vaccine  Aged Out  *Topic was postponed. The date shown is not the original due date.   Call your OB to get your PAP /cervical cancer screening updated  Preventive Care 62-61 Years Old, Female Preventive care refers to lifestyle choices and visits with your health care provider that can promote health and wellness. Preventive care visits are also called wellness exams. What can I expect for my preventive care visit? Counseling During your preventive care visit, your health care provider may ask about your: Medical history, including: Past medical problems. Family medical history. Pregnancy history. Current health, including: Menstrual cycle. Method of birth control. Emotional well-being. Home life and relationship well-being. Sexual activity and sexual health. Lifestyle, including: Alcohol, nicotine or tobacco, and drug use. Access to firearms. Diet, exercise, and sleep habits. Work and work Statistician. Sunscreen use. Safety issues such as seatbelt and bike helmet use. Physical exam Your health care provider may check your: Height and weight. These may be used to calculate your BMI (body mass index). BMI is a measurement that tells if you are at a healthy weight. Waist circumference. This measures the distance around your waistline. This measurement also tells if you are at a healthy weight and may help predict your risk of certain diseases, such as type 2 diabetes and high blood pressure. Heart rate and blood pressure. Body temperature. Skin for abnormal spots. What immunizations do I need?  Vaccines are usually given at various ages, according to a schedule. Your health  care provider will recommend vaccines for you based on your age, medical history, and lifestyle or other factors, such as travel or where you work. What tests do I need? Screening Your health care provider may recommend screening tests for certain conditions. This may include: Pelvic exam and Pap test. Lipid and cholesterol levels. Diabetes screening. This is done by checking your blood sugar (glucose) after you have not eaten for a while (fasting). Hepatitis B test. Hepatitis C test. HIV (human immunodeficiency virus) test. STI (sexually transmitted infection) testing, if you are at risk. BRCA-related cancer screening. This may be done if you have a family history of breast, ovarian, tubal, or peritoneal cancers. Talk with your health care provider about your test results, treatment options, and if necessary, the need for more tests. Follow these instructions at home: Eating and drinking  Eat a healthy diet that includes fresh fruits and vegetables, whole grains, lean protein, and low-fat dairy products. Take vitamin and mineral supplements as recommended by your health care provider. Do not drink alcohol if: Your health care provider tells you not to drink. You are pregnant, may be pregnant, or are planning to become pregnant. If you drink alcohol: Limit how much you have to 0-1 drink a day. Know how much alcohol is in your drink. In the [place] one drink equals one 12 oz bottle of beer (355 mL), one 5 oz glass of wine (148 mL), or one 1 oz glass of hard liquor (44 mL). Lifestyle Brush your teeth every morning and night with fluoride toothpaste. Floss one time each day. Exercise for at least 30 minutes 5 or more days each week.  Do not use any products that contain nicotine or tobacco. These products include cigarettes, chewing tobacco, and vaping devices, such as e-cigarettes. If you need help quitting, ask your health care provider. Do not use drugs. If you are sexually active,  practice safe sex. Use a condom or other form of protection to prevent STIs. If you do not wish to become pregnant, use a form of birth control. If you plan to become pregnant, see your health care provider for a prepregnancy visit. Find healthy ways to manage stress, such as: Meditation, yoga, or listening to music. Journaling. Talking to a trusted person. Spending time with friends and family. Minimize exposure to UV radiation to reduce your risk of skin cancer. Safety Always wear your seat belt while driving or riding in a vehicle. Do not drive: If you have been drinking alcohol. Do not ride with someone who has been drinking. If you have been using any mind-altering substances or drugs. While texting. When you are tired or distracted. Wear a helmet and other protective equipment during sports activities. If you have firearms in your house, make sure you follow all gun safety procedures. Seek help if you have been physically or sexually abused. What's next? Go to your health care provider once a year for an annual wellness visit. Ask your health care provider how often you should have your eyes and teeth checked. Stay up to date on all vaccines. This information is not intended to replace advice given to you by your health care provider. Make sure you discuss any questions you have with your health care provider. Document Revised: 11/04/2020 Document Reviewed: 11/04/2020 Elsevier Patient Education  [employer].

## 2022-06-01 ENCOUNTER — Telehealth: Payer: Self-pay | Admitting: Family Medicine

## 2022-06-01 LAB — COMPLETE METABOLIC PANEL WITH GFR
AG Ratio: 1.1 (calc) (ref 1.0–2.5)
ALT: 29 U/L (ref 6–29)
AST: 26 U/L (ref 10–30)
Albumin: 3.6 g/dL (ref 3.6–5.1)
Alkaline phosphatase (APISO): 93 U/L (ref 31–125)
BUN: 9 mg/dL (ref 7–25)
CO2: 21 mmol/L (ref 20–32)
Calcium: 8.6 mg/dL (ref 8.6–10.2)
Chloride: 108 mmol/L (ref 98–110)
Creat: 0.74 mg/dL (ref 0.50–0.97)
Globulin: 3.4 g/dL (calc) (ref 1.9–3.7)
Glucose, Bld: 78 mg/dL (ref 65–99)
Potassium: 4.1 mmol/L (ref 3.5–5.3)
Sodium: 139 mmol/L (ref 135–146)
Total Bilirubin: 0.4 mg/dL (ref 0.2–1.2)
Total Protein: 7 g/dL (ref 6.1–8.1)
eGFR: 109 mL/min/{1.73_m2} (ref >=60)

## 2022-06-01 LAB — CBC WITH DIFFERENTIAL/PLATELET
Absolute Monocytes: 189 cells/uL — ABNORMAL LOW (ref 200–950)
Basophils Absolute: 9 cells/uL (ref 0–200)
Basophils Relative: 0.2 %
Eosinophils Absolute: 230 cells/uL (ref 15–500)
Eosinophils Relative: 5 %
HCT: 37.2 % (ref 35.0–45.0)
Hemoglobin: 11.7 g/dL (ref 11.7–15.5)
Lymphs Abs: 1472 cells/uL (ref 850–3900)
MCH: 25.8 pg — ABNORMAL LOW (ref 27.0–33.0)
MCHC: 31.5 g/dL — ABNORMAL LOW (ref 32.0–36.0)
MCV: 81.9 fL (ref 80.0–100.0)
MPV: 9.9 fL (ref 7.5–12.5)
Monocytes Relative: 4.1 %
Neutro Abs: 2700 cells/uL (ref 1500–7800)
Neutrophils Relative %: 58.7 %
Platelets: 463 10*3/uL — ABNORMAL HIGH (ref 140–400)
RBC: 4.54 10*6/uL (ref 3.80–5.10)
RDW: 13.6 % (ref 11.0–15.0)
Total Lymphocyte: 32 %
WBC: 4.6 10*3/uL (ref 3.8–10.8)

## 2022-06-01 LAB — QUANTIFERON-TB GOLD PLUS
Mitogen-NIL: >10 IU/mL
NIL: 0.02 IU/mL
QuantiFERON-TB Gold Plus: NEGATIVE
TB1-NIL: 0 IU/mL
TB2-NIL: 0 IU/mL

## 2022-06-01 LAB — HEPATITIS C ANTIBODY: Hepatitis C Ab: NONREACTIVE

## 2022-06-01 NOTE — Telephone Encounter (Signed)
Pt informed form ready to be picked up

## 2022-06-01 NOTE — Telephone Encounter (Signed)
Copied from [place] [id number]. Topic: General - Other >> Jun 01, 2022  9:14 AM Everette C wrote: Reason for CRM: The patient has called to request contact with a member of clinical staff regarding their TB results and completion of their previously dropped off health certificate   Please contact the patient further when possible

## 2022-06-02 ENCOUNTER — Ambulatory Visit: Admission: RE | Admit: 2022-06-02 | Payer: Medicaid Other | Source: Ambulatory Visit

## 2022-06-13 ENCOUNTER — Other Ambulatory Visit: Payer: Self-pay | Admitting: Family Medicine

## 2022-06-13 DIAGNOSIS — R1013 Epigastric pain: Secondary | ICD-10-CM

## 2022-08-12 ENCOUNTER — Other Ambulatory Visit: Payer: Self-pay | Admitting: Family Medicine

## 2022-08-12 DIAGNOSIS — J302 Other seasonal allergic rhinitis: Secondary | ICD-10-CM

## 2022-08-12 DIAGNOSIS — H1013 Acute atopic conjunctivitis, bilateral: Secondary | ICD-10-CM

## 2022-08-12 NOTE — Telephone Encounter (Signed)
Medication Refill - Medication:  cetirizine (ZYRTEC) 10 MG tablet  cromolyn (OPTICROM) 4 % ophthalmic solution  fluticasone (FLONASE) 50 MCG/ACT nasal spray   Has the patient contacted their pharmacy? No. (Agent: If no, request that the patient contact the pharmacy for the refill. If patient does not wish to contact the pharmacy document the reason why and proceed with request.) (Agent: If yes, when and what did the pharmacy advise?)  Preferred Pharmacy (with phone number or street name):  [id number] [place] [address] Phone: [phone]  Fax: [phone]     Has the patient been seen for an appointment in the last year OR does the patient have an upcoming appointment? Yes.    Agent: Please be advised that RX refills may take up to 3 business days. We ask that you follow-up with your pharmacy.

## 2022-08-12 NOTE — Telephone Encounter (Signed)
Requested medication (s) are due for refill today: expired medications  Requested medication (s) are on the active medication list: yes   Last refill:  zyrtec- 10/07/19 #30 1 refills , opticrom- 12/21/19 #30 2 refills, flonase- 09/03/19 #16g 11 refills  Future visit scheduled: no   Notes to clinic:  expired medications do you want to refill Rx?     Requested Prescriptions  Pending Prescriptions Disp Refills   cetirizine (ZYRTEC) 10 MG tablet 30 tablet 11    Sig: Take 1 tablet (10 mg total) by mouth daily.     Ear, Nose, and Throat:  Antihistamines 2 Passed - 08/12/2022  9:45 AM      Passed - Cr in normal range and within 360 days    Creat  Date Value Ref Range Status  05/30/2022 0.74 0.50 - 0.97 mg/dL Final         Passed - Valid encounter within last 12 months    Recent Outpatient Visits           2 months ago Adult general medical exam   [facility] Delsa Grana, PA-C   1 year ago [facility] Bo Merino, FNP   1 year ago Urinary frequency   [facility] Myles Gip, DO   1 year ago Urinary frequency   [facility] Delsa Grana, PA-C   2 years ago No-show for appointment   [facility] Delsa Grana, PA-C               fluticasone [facility]) 50 MCG/ACT nasal spray 16 g 11    Sig: Place 2 sprays into both nostrils daily.     Ear, Nose, and Throat: Nasal Preparations - Corticosteroids Passed - 08/12/2022  9:45 AM      Passed - Valid encounter within last 12 months    Recent Outpatient Visits           2 months ago Adult general medical exam   [facility] Delsa Grana, PA-C   1 year ago [facility] Bo Merino, FNP   1 year ago Urinary frequency   [facility] Myles Gip, DO   1 year ago Urinary frequency   [facility] Delsa Grana, PA-C   2 years ago No-show for appointment   [facility] Delsa Grana, PA-C               cromolyn (OPTICROM) 4 % ophthalmic solution 30 mL 2    Sig: Place 1 drop into both eyes 4 (four) times daily as needed (for itchy, watery or swollen eye sx).     Ophthalmology:  Antiallergy Passed - 08/12/2022  9:45 AM      Passed - Valid encounter within last 12 months    Recent Outpatient Visits           2 months ago Adult general medical exam   [facility] Delsa Grana, PA-C   1 year ago [facility] Bo Merino, FNP   1 year ago Urinary frequency   [facility] Myles Gip, DO   1 year ago Urinary frequency   [facility] Delsa Grana, PA-C   2 years ago No-show for appointment  [facility] Delsa Grana, [place]

## 2022-08-15 MED ORDER — FLUTICASONE PROPIONATE 50 MCG/ACT NA SUSP: 2.0000 | Freq: Every day | NASAL | 5 refills | Status: DC

## 2022-08-15 MED ORDER — CETIRIZINE HCL 10 MG PO TABS: 10.0000 mg | ORAL_TABLET | Freq: Every day | ORAL | 5 refills | Status: DC

## 2022-08-15 MED ORDER — CROMOLYN SODIUM 4 % OP SOLN: 1.0000 [drp] | Freq: Four times a day (QID) | OPHTHALMIC | 1 refills | Status: DC | PRN

## 2023-02-15 ENCOUNTER — Telehealth: Payer: Self-pay

## 2023-02-15 ENCOUNTER — Encounter: Payer: Self-pay | Admitting: Nurse Practitioner

## 2023-02-15 ENCOUNTER — Telehealth (INDEPENDENT_AMBULATORY_CARE_PROVIDER_SITE_OTHER): Payer: Medicaid Other | Admitting: Nurse Practitioner

## 2023-02-15 VITALS — BP 120/70 | HR 98 | Temp 98.2°F | Resp 18 | Ht 65.0 in | Wt 283.0 lb

## 2023-02-15 DIAGNOSIS — R300 Dysuria: Secondary | ICD-10-CM | POA: Diagnosis not present

## 2023-02-15 LAB — POCT URINALYSIS DIPSTICK
Bilirubin, UA: NEGATIVE
Glucose, UA: NEGATIVE
Ketones, UA: NEGATIVE
Nitrite, UA: NEGATIVE
Protein, UA: POSITIVE — AB
Spec Grav, UA: 1.02 (ref 1.010–1.025)
Urobilinogen, UA: 0.2 E.U./dL
pH, UA: 6 (ref 5.0–8.0)

## 2023-02-15 MED ORDER — CEPHALEXIN 500 MG PO CAPS: 500.0000 mg | ORAL_CAPSULE | Freq: Two times a day (BID) | ORAL | 0 refills | Status: AC

## 2023-02-15 NOTE — Progress Notes (Signed)
BP 120/70   Pulse 98   Temp 98.2 F (36.8 C) (Oral)   Resp 18   Ht 5\' 5"  (1.651 m)   Wt 283 lb (128.4 kg)   SpO2 99%   BMI 47.09 kg/m    Subjective:    Patient ID: Sherry Spence, female    DOB: December 07, 1990, 32 y.o.   MRN: [id number]  HPI: Sherry Spence is a 32 y.o. female  Chief Complaint  Patient presents with   Dysuria    X2 weeks, OTC meds no help.   Initially on for a virtual but patient came in person  URINARY SYMPTOMS- symptoms started 2 weeks ago  Dysuria: yes Urinary frequency: yes Urgency: yes Small volume voids: yes Symptom severity: mild Urinary incontinence: no Foul odor: no Hematuria: no Abdominal pain: no Back pain: no Suprapubic pain/pressure: yes Flank pain: no Fever:  no Vomiting: no Relief with cranberry juice: no Relief with pyridium: no Status: no change Previous urinary tract infection: yes Recurrent urinary tract infection: no History of sexually transmitted disease: no Treatments attempted: increasing fluids   Urine positive for blood, protein and leukocytes, sent urine for culture will start patient on keflex.  Push fluids.   Relevant past medical, surgical, family and social history reviewed and updated as indicated. Interim medical history since our last visit reviewed. Allergies and medications reviewed and updated.  Review of Systems  Per HPI unless specifically indicated above     Objective:    BP 120/70   Pulse 98   Temp 98.2 F (36.8 C) (Oral)   Resp 18   Ht 5\' 5"  (1.651 m)   Wt 283 lb (128.4 kg)   SpO2 99%   BMI 47.09 kg/m   Wt Readings from Last 3 Encounters:  02/15/23 283 lb (128.4 kg)  05/30/22 280 lb (127 kg)  01/06/21 277 lb 6.4 oz (125.8 kg)    Physical Exam  Constitutional: Patient appears well-developed and well-nourished. Obese  No distress.  HEENT: head atraumatic, normocephalic, pupils equal and reactive to light, neck supple, throat within normal limits Cardiovascular: Normal rate,  regular rhythm and normal heart sounds.  No murmur heard. No BLE edema. Pulmonary/Chest: Effort normal and breath sounds normal. No respiratory distress. Abdominal: Soft.  There is no tenderness. No CVA tenderness Psychiatric: Patient has a normal mood and affect. behavior is normal. Judgment and thought content normal.  Results for orders placed or performed in visit on 02/15/23  POCT Urinalysis Dipstick  Result Value Ref Range   Color, UA gold    Clarity, UA cloudy    Glucose, UA Negative Negative   Bilirubin, UA negative    Ketones, UA negative    Spec Grav, UA 1.020 1.010 - 1.025   Blood, UA small    pH, UA 6.0 5.0 - 8.0   Protein, UA Positive (A) Negative   Urobilinogen, UA 0.2 0.2 or 1.0 E.U./dL   Nitrite, UA negative    Leukocytes, UA Small (1+) (A) Negative   Appearance cloudy    Odor none       Assessment & Plan:   Problem List Items Addressed This Visit   None Visit Diagnoses     Dysuria    -  Primary   urine sent for culture, start keflex, push fluids   Relevant Medications   cephALEXin (KEFLEX) 500 MG capsule   Other Relevant Orders   POCT Urinalysis Dipstick (Completed)   CULTURE, URINE COMPREHENSIVE  Follow up plan: Return if symptoms worsen or fail to improve.

## 2023-02-15 NOTE — Addendum Note (Signed)
Addended by: Daliah Chaudoin, Sherrill Raring on: 02/15/2023 03:34 PM   Modules accepted: Orders

## 2023-02-15 NOTE — Telephone Encounter (Signed)
Spoke with patient and she will come after work today to drop off urine sample

## 2023-02-16 LAB — URINE CULTURE
MICRO NUMBER:: 15516451
Result:: NO GROWTH
SPECIMEN QUALITY:: ADEQUATE

## 2023-08-01 ENCOUNTER — Ambulatory Visit: Admitting: Family Medicine

## 2023-08-01 ENCOUNTER — Encounter: Payer: Self-pay | Admitting: Family Medicine

## 2023-08-01 VITALS — BP 128/72 | HR 91 | Resp 16 | Ht 65.0 in | Wt 283.0 lb

## 2023-08-01 DIAGNOSIS — M791 Myalgia, unspecified site: Secondary | ICD-10-CM | POA: Diagnosis not present

## 2023-08-01 DIAGNOSIS — R768 Other specified abnormal immunological findings in serum: Secondary | ICD-10-CM

## 2023-08-01 DIAGNOSIS — M2550 Pain in unspecified joint: Secondary | ICD-10-CM

## 2023-08-01 DIAGNOSIS — M25561 Pain in right knee: Secondary | ICD-10-CM

## 2023-08-01 DIAGNOSIS — M25512 Pain in left shoulder: Secondary | ICD-10-CM

## 2023-08-01 DIAGNOSIS — L659 Nonscarring hair loss, unspecified: Secondary | ICD-10-CM | POA: Diagnosis not present

## 2023-08-01 DIAGNOSIS — M79641 Pain in right hand: Secondary | ICD-10-CM

## 2023-08-01 DIAGNOSIS — M79642 Pain in left hand: Secondary | ICD-10-CM | POA: Diagnosis not present

## 2023-08-01 DIAGNOSIS — M79671 Pain in right foot: Secondary | ICD-10-CM

## 2023-08-01 MED ORDER — PREDNISONE 10 MG (21) PO TBPK: ORAL_TABLET | ORAL | 0 refills | Status: DC

## 2023-08-01 NOTE — Progress Notes (Signed)
 Patient ID: Sherry Spence, female    DOB: 09-05-1990, 33 y.o.   MRN: [id number]  PCP: Danelle Berry, PA-C  Chief Complaint  Patient presents with   Neck Pain    Joint pains all over body, x2 weeks   Shoulder Pain   Alopecia    X2 months    Subjective:   Sherry Spence is a 33 y.o. female, presents to clinic with CC of the following:  HPI  Presents with pain to neck shoulders hands bilaterally, right knee and right heel pain Most of her pain started 2 weeks ago (Heel has bothered her for a month +, sharp pain worse in the am)      Patient Active Problem List   Diagnosis Date Noted   Microcytic anemia 01/14/2019   Seasonal allergic rhinitis 01/14/2019   Class 3 severe obesity with body mass index (BMI) of 45.0 to 49.9 in adult (HCC) 03/18/2016   H/O chlamydia infection 02/28/2014   Gastroesophageal reflux disease without esophagitis 01/20/2014      Current Outpatient Medications:    albuterol (VENTOLIN HFA) 108 (90 Base) MCG/ACT inhaler, Inhale 2 puffs into the lungs every 4 (four) hours as needed for wheezing or shortness of breath., Disp: 18 g, Rfl: 0   cetirizine (ZYRTEC) 10 MG tablet, Take 1 tablet (10 mg total) by mouth daily., Disp: 30 tablet, Rfl: 5   cromolyn (OPTICROM) 4 % ophthalmic solution, Place 1 drop into both eyes 4 (four) times daily as needed (for itchy, watery or swollen eye sx)., Disp: 30 mL, Rfl: 1   Etonogestrel (NEXPLANON [place]), Inject into the skin., Disp: , Rfl:    fluticasone (FLONASE) 50 MCG/ACT nasal spray, Place 2 sprays into both nostrils daily., Disp: 16 g, Rfl: 5   pantoprazole (PROTONIX) 20 MG tablet, Take 1 tablet (20 mg total) by mouth every morning. One hour before breakfast (Patient not taking: Reported on 08/01/2023), Disp: 30 tablet, Rfl: 1   Allergies  Allergen Reactions   Macrobid [Nitrofurantoin] Nausea Only    And dizziness   Compazine [Prochlorperazine Edisylate] Anxiety     Social History   Tobacco Use    Smoking status: Never   Smokeless tobacco: Never  Vaping Use   Vaping status: Never Used  Substance Use Topics   Alcohol use: No   Drug use: No      Chart Review Today: I personally reviewed active problem list, medication list, allergies, family history, social history, health maintenance, notes from last encounter, lab results, imaging with the patient/caregiver today.   Review of Systems  Constitutional: Negative.   HENT: Negative.    Eyes: Negative.   Respiratory: Negative.    Cardiovascular: Negative.   Gastrointestinal: Negative.   Endocrine: Negative.   Genitourinary: Negative.   Musculoskeletal: Negative.   Skin: Negative.   Allergic/Immunologic: Negative.   Neurological: Negative.   Hematological: Negative.   Psychiatric/Behavioral: Negative.    All other systems reviewed and are negative.      Objective:   Vitals:   08/01/23 1543  BP: 128/72  Pulse: 91  Resp: 16  SpO2: 99%  Weight: 283 lb (128.4 kg)  Height: 5\' 5"  (1.651 m)    Body mass index is 47.09 kg/m.  Physical Exam Vitals and nursing note reviewed.  Constitutional:      General: She is not in acute distress.    Appearance: Normal appearance. She is well-developed. She is obese. She is not ill-appearing, toxic-appearing or diaphoretic.  HENT:  Head: Normocephalic and atraumatic.     Nose: Nose normal.  Eyes:     General:        Right eye: No discharge.        Left eye: No discharge.     Conjunctiva/sclera: Conjunctivae normal.  Neck:     Trachea: No tracheal deviation.  Cardiovascular:     Rate and Rhythm: Normal rate and regular rhythm.  Pulmonary:     Effort: Pulmonary effort is normal. No respiratory distress.     Breath sounds: No stridor.  Musculoskeletal:     Left shoulder: Normal. No swelling, deformity, tenderness, bony tenderness or crepitus. Normal strength. Normal pulse.     Right wrist: Normal.     Left wrist: Normal.     Right hand: No swelling, deformity,  tenderness or bony tenderness. Normal range of motion. Normal strength. Normal sensation. Normal capillary refill. Normal pulse.     Left hand: No swelling, deformity, tenderness or bony tenderness. Normal range of motion. Normal strength. Normal sensation. Normal capillary refill. Normal pulse.     Cervical back: No swelling, edema, deformity, erythema, spasms, tenderness, bony tenderness or crepitus. No pain with movement. Normal range of motion.     Right knee: Normal. No swelling, deformity, effusion or bony tenderness. No tenderness.     Right ankle: Normal. No tenderness. Normal range of motion.     Right Achilles Tendon: Normal. No tenderness.     Right foot: Normal capillary refill. Tenderness and bony tenderness present. No swelling, deformity or crepitus. Normal pulse.  Skin:    General: Skin is warm and dry.     Capillary Refill: Capillary refill takes less than 2 seconds.     Findings: No erythema or rash.  Neurological:     Mental Status: She is alert.     Motor: No abnormal muscle tone.     Coordination: Coordination normal.  Psychiatric:        Mood and Affect: Mood normal. Affect is tearful.        Behavior: Behavior normal.      Results for orders placed or performed in visit on 02/15/23  POCT Urinalysis Dipstick   Collection Time: 02/15/23  3:22 PM  Result Value Ref Range   Color, UA gold    Clarity, UA cloudy    Glucose, UA Negative Negative   Bilirubin, UA negative    Ketones, UA negative    Spec Grav, UA 1.020 1.010 - 1.025   Blood, UA small    pH, UA 6.0 5.0 - 8.0   Protein, UA Positive (A) Negative   Urobilinogen, UA 0.2 0.2 or 1.0 E.U./dL   Nitrite, UA negative    Leukocytes, UA Small (1+) (A) Negative   Appearance cloudy    Odor none   Urine Culture   Collection Time: 02/15/23  3:36 PM   Specimen: Urine  Result Value Ref Range   MICRO NUMBER: [id number]    SPECIMEN QUALITY: Adequate    Sample Source URINE, CLEAN CATCH    STATUS: FINAL    Result:  No Growth        Assessment & Plan:   About to get nexplanon removed - she wonders if this is related to her symptoms She also is concerned with Vit D deficiency since it runs in her family No other recent changes or symptoms, denies change in weight, energy, fevers, sweats, rashes For her various areas of pain she did try ibuprofen and it helped reduce pain but  it did not make it go all the way away.  1. Polyarthralgia (Primary) Multiple joints with pain, initially she said neck pain bilateral shoulder and arm and hand pain, then she stated more focal left shoulder pain, bilateral hand pain, right knee, right heel R/o inflammatory conditions/autoimmune disease Tx with NSAIDS, tylenol can try steroid taper after labs completed and then pending labs ortho or rheumatology  - CBC with Differential/Platelet - ANA - C-reactive protein - Rheumatoid factor - Sedimentation rate - COMPLETE METABOLIC PANEL WITH GFR - VITAMIN D 25 Hydroxy (Vit-D Deficiency, Fractures) - predniSONE (STERAPRED UNI-PAK 21 TAB) 10 MG (21) TBPK tablet; Take as directed on package.  (60 mg po on day 1, 50 mg po on day 2...)  Dispense: 21 tablet; Refill: 0  2. Myalgia Multiple areas of pain  - CBC with Differential/Platelet - ANA - C-reactive protein - Rheumatoid factor - Sedimentation rate - COMPLETE METABOLIC PANEL WITH GFR  3. Hair loss R/o hypothyroid - T4, free - TSH  4. Pain of right heel Plantar heel point ttp, gave pt exercises to do and we will have her consult with podiatry, suspect plantar fasciitis  - Ambulatory referral to Podiatry  5. Acute pain of right knee Right knee pain x 2 weeks, no injury, no bony ttp  6. Acute pain of left shoulder No injury, pain for 2 weeks On exam no bony or msk ttp  7. Bilateral hand pain No warmth, swelling or tenderness on exam, good ROM, grip strength, sensation and pulses bilaterally          Danelle Berry, PA-C 08/01/23 3:56 PM

## 2023-08-01 NOTE — Patient Instructions (Signed)
 I have referred you to podiatry for evaluation of your heel pain.  I suspect it could be plantar fasciitis  I will wait to see the lab results before referring you to ortho and/or rheumatology  For now since there seems to be a lot of inflammation in your body and you can continue to treat it with over the counter NSAIDS (ibuprofen, aleve, naproxen) and please try tylenol [phone] mg up to 4 x a day (max 24 hour dose is 4000 mg). I have prescribed some steroids for you to hopefully calm your body's inflammation down. Take it in the morning with food in your stomach, and if you get any stomach upset then please hold or reduce your NSAID use and start taking pepcid 20 mg twice a day.

## 2023-08-14 ENCOUNTER — Ambulatory Visit: Admitting: Podiatry

## 2023-08-15 DIAGNOSIS — M2550 Pain in unspecified joint: Secondary | ICD-10-CM | POA: Diagnosis not present

## 2023-08-15 DIAGNOSIS — L659 Nonscarring hair loss, unspecified: Secondary | ICD-10-CM | POA: Diagnosis not present

## 2023-08-15 DIAGNOSIS — M791 Myalgia, unspecified site: Secondary | ICD-10-CM | POA: Diagnosis not present

## 2023-08-17 ENCOUNTER — Encounter: Payer: Self-pay | Admitting: Family Medicine

## 2023-08-17 DIAGNOSIS — H1013 Acute atopic conjunctivitis, bilateral: Secondary | ICD-10-CM

## 2023-08-17 DIAGNOSIS — J302 Other seasonal allergic rhinitis: Secondary | ICD-10-CM

## 2023-08-17 LAB — COMPREHENSIVE METABOLIC PANEL WITH GFR
AG Ratio: 1.1 (calc) (ref 1.0–2.5)
ALT: 19 U/L (ref 6–29)
AST: 17 U/L (ref 10–30)
Albumin: 3.6 g/dL (ref 3.6–5.1)
Alkaline phosphatase (APISO): 83 U/L (ref 31–125)
BUN: 7 mg/dL (ref 7–25)
CO2: 27 mmol/L (ref 20–32)
Calcium: 8.8 mg/dL (ref 8.6–10.2)
Chloride: 106 mmol/L (ref 98–110)
Creat: 0.65 mg/dL (ref 0.50–0.97)
Globulin: 3.3 g/dL (ref 1.9–3.7)
Glucose, Bld: 104 mg/dL — ABNORMAL HIGH (ref 65–99)
Potassium: 4.1 mmol/L (ref 3.5–5.3)
Sodium: 139 mmol/L (ref 135–146)
Total Bilirubin: 0.5 mg/dL (ref 0.2–1.2)
Total Protein: 6.9 g/dL (ref 6.1–8.1)
eGFR: 118 mL/min/{1.73_m2} (ref >=60)

## 2023-08-17 LAB — CBC WITH DIFFERENTIAL/PLATELET
Absolute Lymphocytes: 1155 {cells}/uL (ref 850–3900)
Absolute Monocytes: 230 {cells}/uL (ref 200–950)
Basophils Absolute: 10 {cells}/uL (ref 0–200)
Basophils Relative: 0.2 %
Eosinophils Absolute: 310 {cells}/uL (ref 15–500)
Eosinophils Relative: 6.2 %
HCT: 35.7 % (ref 35.0–45.0)
Hemoglobin: 11.2 g/dL — ABNORMAL LOW (ref 11.7–15.5)
MCH: 25.3 pg — ABNORMAL LOW (ref 27.0–33.0)
MCHC: 31.4 g/dL — ABNORMAL LOW (ref 32.0–36.0)
MCV: 80.8 fL (ref 80.0–100.0)
MPV: 9.7 fL (ref 7.5–12.5)
Monocytes Relative: 4.6 %
Neutro Abs: 3295 {cells}/uL (ref 1500–7800)
Neutrophils Relative %: 65.9 %
Platelets: 464 10*3/uL — ABNORMAL HIGH (ref 140–400)
RBC: 4.42 10*6/uL (ref 3.80–5.10)
RDW: 13.6 % (ref 11.0–15.0)
Total Lymphocyte: 23.1 %
WBC: 5 10*3/uL (ref 3.8–10.8)

## 2023-08-17 LAB — VITAMIN D 25 HYDROXY (VIT D DEFICIENCY, FRACTURES): Vit D, 25-Hydroxy: 16 ng/mL — ABNORMAL LOW (ref 30–100)

## 2023-08-17 LAB — TSH: TSH: 0.58 m[IU]/L

## 2023-08-17 LAB — ANTI-NUCLEAR AB-TITER (ANA TITER)
ANA TITER: 1:40 {titer} — ABNORMAL HIGH
ANA Titer 1: 1:160 {titer} — ABNORMAL HIGH

## 2023-08-17 LAB — T4, FREE: Free T4: 1.1 ng/dL (ref 0.8–1.8)

## 2023-08-17 LAB — SEDIMENTATION RATE: Sed Rate: 31 mm/h — ABNORMAL HIGH (ref 0–20)

## 2023-08-17 LAB — C-REACTIVE PROTEIN: CRP: 3.8 mg/L (ref <8.0)

## 2023-08-17 LAB — ANA: Anti Nuclear Antibody (ANA): POSITIVE — AB

## 2023-08-17 LAB — RHEUMATOID FACTOR: Rheumatoid fact SerPl-aCnc: 15 [IU]/mL — ABNORMAL HIGH (ref <14)

## 2023-08-18 MED ORDER — FLUTICASONE PROPIONATE 50 MCG/ACT NA SUSP: 2.0000 | Freq: Every day | NASAL | 1 refills | Status: DC

## 2023-08-18 MED ORDER — CROMOLYN SODIUM 4 % OP SOLN: 1.0000 [drp] | Freq: Four times a day (QID) | OPHTHALMIC | 0 refills | Status: DC | PRN

## 2023-08-18 MED ORDER — CETIRIZINE HCL 10 MG PO TABS: 10.0000 mg | ORAL_TABLET | Freq: Every day | ORAL | 1 refills | Status: DC

## 2023-08-21 ENCOUNTER — Telehealth: Payer: Self-pay

## 2023-08-21 NOTE — Telephone Encounter (Signed)
 Copied from CRM [id number]. Topic: Clinical - Lab/Test Results >> Aug 21, 2023 10:29 AM Gery Pray wrote: Reason for CRM: Patient calling in regards to labs completed on 03/25. Patient would like a nurse of provider to call and go over results. Please contact patient at [phone]

## 2023-08-22 ENCOUNTER — Other Ambulatory Visit: Payer: Self-pay | Admitting: Family Medicine

## 2023-08-22 ENCOUNTER — Encounter: Payer: Self-pay | Admitting: Family Medicine

## 2023-08-22 DIAGNOSIS — E559 Vitamin D deficiency, unspecified: Secondary | ICD-10-CM

## 2023-08-22 MED ORDER — VITAMIN D (ERGOCALCIFEROL) 1.25 MG (50000 UNIT) PO CAPS: 50000.0000 [IU] | ORAL_CAPSULE | ORAL | 1 refills | Status: DC

## 2023-08-22 NOTE — Addendum Note (Signed)
 Addended by: Danelle Berry on: 08/22/2023 04:20 PM   Modules accepted: Orders

## 2023-09-11 ENCOUNTER — Other Ambulatory Visit: Payer: Self-pay | Admitting: Family Medicine

## 2023-09-11 DIAGNOSIS — J302 Other seasonal allergic rhinitis: Secondary | ICD-10-CM

## 2023-09-11 DIAGNOSIS — H1013 Acute atopic conjunctivitis, bilateral: Secondary | ICD-10-CM

## 2023-09-12 ENCOUNTER — Ambulatory Visit: Payer: Medicaid Other | Admitting: Obstetrics & Gynecology

## 2023-09-12 NOTE — Telephone Encounter (Signed)
 Requested Prescriptions  Pending Prescriptions Disp Refills   cetirizine  (ZYRTEC ) 10 MG tablet [Pharmacy Med Name: CETIRIZINE  HCL 10 MG TABLET] 90 tablet 0    Sig: TAKE 1 TABLET BY MOUTH EVERY DAY     Ear, Nose, and Throat:  Antihistamines 2 Failed - 09/12/2023 10:38 AM      Failed - Valid encounter within last 12 months    Recent Outpatient Visits           1 month ago Polyarthralgia   [facility] Adeline Hone, PA-C              Passed - Cr in normal range and within 360 days    Creat  Date Value Ref Range Status  08/15/2023 0.65 0.50 - 0.97 mg/dL Final          fluticasone  (FLONASE ) 50 MCG/ACT nasal spray [Pharmacy Med Name: FLUTICASONE  PROP 50 MCG SPRAY] 48 mL 0    Sig: SPRAY 2 SPRAYS INTO EACH NOSTRIL EVERY DAY     Ear, Nose, and Throat: Nasal Preparations - Corticosteroids Failed - 09/12/2023 10:38 AM      Failed - Valid encounter within last 12 months    Recent Outpatient Visits           1 month ago Polyarthralgia   [facility] Adeline Hone, PA-C

## 2023-10-10 ENCOUNTER — Ambulatory Visit: Admitting: Obstetrics & Gynecology

## 2023-10-10 ENCOUNTER — Encounter: Payer: Self-pay | Admitting: Obstetrics & Gynecology

## 2023-10-10 VITALS — BP 132/84 | HR 78 | Wt 277.4 lb

## 2023-10-10 DIAGNOSIS — N939 Abnormal uterine and vaginal bleeding, unspecified: Secondary | ICD-10-CM | POA: Diagnosis not present

## 2023-10-10 DIAGNOSIS — Z3046 Encounter for surveillance of implantable subdermal contraceptive: Secondary | ICD-10-CM

## 2023-10-10 NOTE — Progress Notes (Signed)
     GYNECOLOGY OFFICE PROCEDURE NOTE  Sherry Spence is a 33 y.o. [id number] here for Nexplanon  removal, this was placed in 2018.  Last pap smear was on 11/28/2016 and was normal. She declines pap smear today, this was offered.  Husband had vasectomy.  Patient reports prolonged bleeding episode from February to May 2025, had normal hemoglobin and TSH lately. Negative pregnancy test. No current bleeding, no other gynecologic concerns.  Nexplanon  Removal Patient identified, informed consent performed, consent signed.   Appropriate time out taken. Nexplanon  site identified.  Area prepped in usual sterile fashon. One ml of 1% lidocaine  was used to anesthetize the area at the distal end of the implant. A small stab incision was made right beside the implant on the distal portion.  The Nexplanon  rod was grasped using hemostats and removed without difficulty.  There was minimal blood loss. There were no complications.   Steri-strips were applied over the small incision.  A pressure bandage was applied to reduce any bruising.  The patient tolerated the procedure well and was given post procedure instructions.    Pelvic ultrasound ordered given her reported bleeding, will follow up results and manage accordingly.  Return for Annual exam, discuss ultrasound results.    Lenoard Rad, MD, FACOG Obstetrician & Gynecologist, [facility] for [employer], [facility]

## 2023-10-10 NOTE — Patient Instructions (Signed)
Nexplanon Instructions After Removal  Keep bandage clean and dry for 24 hours  May use ice/Tylenol/Ibuprofen for soreness or pain  If you develop fever, drainage or increased warmth from incision site-contact office immediately   

## 2023-10-19 ENCOUNTER — Ambulatory Visit: Attending: Obstetrics & Gynecology

## 2023-10-20 ENCOUNTER — Ambulatory Visit: Admitting: Family Medicine

## 2023-11-15 ENCOUNTER — Ambulatory Visit: Payer: Self-pay | Admitting: *Deleted

## 2023-11-15 NOTE — Telephone Encounter (Signed)
 FYI Only or Action Required?: Action required by provider: request for appointment and or come in to submit sample of urine .  Patient was last seen in primary care on 02/15/2023 by Gareth Mliss FALCON, FNP. Called Nurse Triage reporting Urinary Frequency. Symptoms began several days ago. Interventions attempted: Other: drinking more water. Symptoms are: gradually worsening.  Triage Disposition: See Physician Within 24 Hours  Patient/caregiver understands and will follow disposition?: Yes                  Copied from CRM [id number]. Topic: Clinical - Red Word Triage >> Nov 15, 2023 11:43 AM Marissa P wrote: Red Word that prompted transfer to Nurse Triage: Patient is calling believing she is experiencing an uti. Lower abdomin pain and frequent urination.This started Monday and would like to schedule appointment as soon as possible. Reason for Disposition  All other patients with painful urination  (Exception: [1] EITHER frequency or urgency AND [2] has on-call doctor.)  Answer Assessment - Initial Assessment Questions 1. SEVERITY: How bad is the pain?  (e.g., Scale 1-10; mild, moderate, or severe)   - MILD (1-3): complains slightly about urination hurting   - MODERATE (4-7): interferes with normal activities     - SEVERE (8-10): excruciating, unwilling or unable to urinate because of the pain      Low abdominal pain when have to pee like a heavy full bladder 2. FREQUENCY: How many times have you had painful urination today?      Na  3. PATTERN: Is pain present every time you urinate or just sometimes?      Na  4. ONSET: When did the painful urination start?      Monday  5. FEVER: Do you have a fever? If Yes, ask: What is your temperature, how was it measured, and when did it start?     na 6. PAST UTI: Have you had a urine infection before? If Yes, ask: When was the last time? and What happened that time?      Yes  7. CAUSE: What do you think is causing the  painful urination?  (e.g., UTI, scratch, Herpes sore)     UTI  8. OTHER SYMPTOMS: Do you have any other symptoms? (e.g., blood in urine, flank pain, genital sores, urgency, vaginal discharge)     Low abdominal pain when needing to urinate. Feels full , frequency  9. PREGNANCY: Is there any chance you are pregnant? When was your last menstrual period?     Na   Patient working and requesting if she can come by and submit sample after work today. Scheduled appt for tomorrow with other provider . Patient reports she has to work until 2 pm and will try to make appt as soon as she gets off of work. Please advise.  Protocols used: Urination Pain - Female-A-AH

## 2023-11-16 ENCOUNTER — Ambulatory Visit (INDEPENDENT_AMBULATORY_CARE_PROVIDER_SITE_OTHER): Admitting: Nurse Practitioner

## 2023-11-16 ENCOUNTER — Other Ambulatory Visit (HOSPITAL_COMMUNITY)
Admission: RE | Admit: 2023-11-16 | Discharge: 2023-11-16 | Disposition: A | Discharge status: Home / Self Care | Source: Ambulatory Visit | Attending: Nurse Practitioner | Admitting: Nurse Practitioner

## 2023-11-16 ENCOUNTER — Encounter: Payer: Self-pay | Admitting: Nurse Practitioner

## 2023-11-16 VITALS — BP 116/72 | HR 103 | Temp 98.2°F | Resp 18 | Ht 65.0 in | Wt 271.6 lb

## 2023-11-16 DIAGNOSIS — N898 Other specified noninflammatory disorders of vagina: Secondary | ICD-10-CM

## 2023-11-16 DIAGNOSIS — R350 Frequency of micturition: Secondary | ICD-10-CM

## 2023-11-16 LAB — POCT URINALYSIS DIPSTICK
Bilirubin, UA: NEGATIVE
Blood, UA: NEGATIVE
Glucose, UA: NEGATIVE
Leukocytes, UA: NEGATIVE
Nitrite, UA: NEGATIVE
Odor: NORMAL
Protein, UA: NEGATIVE
Spec Grav, UA: 1.02 (ref 1.010–1.025)
Urobilinogen, UA: 0.2 U/dL
pH, UA: 5.5 (ref 5.0–8.0)

## 2023-11-16 LAB — POCT URINE PREGNANCY: Preg Test, Ur: NEGATIVE

## 2023-11-16 NOTE — Progress Notes (Signed)
 BP 116/72   Pulse (!) 103   Temp 98.2 F (36.8 C)   Resp 18   Ht 5' 5 (1.651 m)   Wt 271 lb 9.6 oz (123.2 kg)   LMP 11/07/2023   SpO2 99%   BMI 45.20 kg/m    Subjective:    Patient ID: Tonnie JONELLE Flatter, female    DOB: [birth year], 33 y.o.   MRN: [id number]  HPI: ELFRIEDE BONINI is a 33 y.o. female  Chief Complaint  Patient presents with   Urinary Tract Infection    Frequency, discharge, abdominal pain and fullness    Discussed the use of AI scribe software for clinical note transcription with the patient, who gave verbal consent to proceed.  History of Present Illness KIMBRELY BUCKEL is a 33 year old female who presents with urinary frequency, vaginal discharge, and pelvic fullness.  She has been experiencing urinary frequency for several days, characterized by frequent urination without dysuria. Despite increased water intake, the symptoms persist, with only small amounts of urine being passed each time. A urine test was performed.  She reports a clear vaginal discharge for about four days, without any strong odor. She is uncertain if the discharge is related to condom use, as she and her husband have been using them while he awaits a vasectomy scheduled for July 20th.  She experiences pelvic fullness and pressure at the bottom of her stomach. No fever, back pain, nausea, diarrhea, or constipation. Her last menstrual cycle began around June 17th or 18th, marking her first period after discontinuing Nexplanon , lasting for a week and described as normal.  A pregnancy test was conducted and returned negative. She notes a recent weight loss from 283 lbs to 271 lbs, attributed to stopping soda consumption and drinking more water. Her glucose level was 104 in March, which was considered normal. She has an upcoming appointment in August for her rheumatoid arthritis, and she reports feeling better with increased exercise and water intake.         02/15/2023   11:10 AM  05/30/2022    2:44 PM 05/13/2021    8:39 AM  Depression screen PHQ 2/9  Decreased Interest 0 0 0  Down, Depressed, Hopeless 0 0 0  PHQ - 2 Score 0 0 0  Altered sleeping  0   Tired, decreased energy  0   Change in appetite  0   Feeling bad or failure about yourself   0   Trouble concentrating  0   Moving slowly or fidgety/restless  0   Suicidal thoughts  0   PHQ-9 Score  0   Difficult doing work/chores  Not difficult at all     Relevant past medical, surgical, family and social history reviewed and updated as indicated. Interim medical history since our last visit reviewed. Allergies and medications reviewed and updated.  Review of Systems  Ten systems reviewed and is negative except as mentioned in HPI      Objective:     BP 116/72   Pulse (!) 103   Temp 98.2 F (36.8 C)   Resp 18   Ht 5' 5 (1.651 m)   Wt 271 lb 9.6 oz (123.2 kg)   LMP 11/07/2023   SpO2 99%   BMI 45.20 kg/m    Wt Readings from Last 3 Encounters:  11/16/23 271 lb 9.6 oz (123.2 kg)  10/10/23 277 lb 6.4 oz (125.8 kg)  08/01/23 283 lb (128.4 kg)    Physical Exam Physical  Exam MEASUREMENTS: Weight- 271. GENERAL: Alert, cooperative, well developed, no acute distress. HEENT: Normocephalic, normal oropharynx, moist mucous membranes. CHEST: Clear to auscultation bilaterally, no wheezes, rhonchi, or crackles. CARDIOVASCULAR: Normal heart rate and rhythm, S1 and S2 normal without murmurs. ABDOMEN: Soft, non-tender, non-distended, without organomegaly, normal bowel sounds, abdomen normal on palpation. No CVA tenderness EXTREMITIES: No cyanosis or edema. NEUROLOGICAL: Cranial nerves grossly intact, moves all extremities without gross motor or sensory deficit.   Results for orders placed or performed in visit on 11/16/23  POCT Urinalysis Dipstick   Collection Time: 11/16/23  2:23 PM  Result Value Ref Range   Color, UA yellow    Clarity, UA clear    Glucose, UA Negative Negative   Bilirubin, UA neg     Ketones, UA neh    Spec Grav, UA 1.020 1.010 - 1.025   Blood, UA neg    pH, UA 5.5 5.0 - 8.0   Protein, UA Negative Negative   Urobilinogen, UA 0.2 0.2 or 1.0 E.U./dL   Nitrite, UA neg    Leukocytes, UA Negative Negative   Appearance clear    Odor normal   POCT urine pregnancy   Collection Time: 11/16/23  2:26 PM  Result Value Ref Range   Preg Test, Ur Negative Negative          Assessment & Plan:   Problem List Items Addressed This Visit   None Visit Diagnoses       Vaginal discharge    -  Primary   Relevant Orders   Cervicovaginal ancillary only     Urinary frequency       Relevant Orders   POCT Urinalysis Dipstick (Completed)   POCT urine pregnancy (Completed)   Urine Culture        Assessment and Plan Assessment & Plan Urinary frequency and pelvic pressure Urinary frequency and pelvic pressure for several days with negative urinalysis, suggestive of a urinary tract infection (UTI). Differential includes UTI and possible contamination from vaginal discharge. No fever, back pain, or dysuria reported. - Send urine for culture to rule out UTI - If urine culture is negative and symptoms persist, repeat urinalysis  Vaginal discharge, etiology under evaluation Clear vaginal discharge for four days with negative pregnancy test. Differential includes bacterial vaginosis (BV) and other causes of vaginal discharge. No strong odor reported, which is sometimes associated with BV. Awaiting results of vaginal swab to determine etiology of discharge. - Await results of vaginal swab to determine etiology of discharge - If vaginal swab is normal and symptoms persist, consider further evaluation  Recent return of menses after Nexplanon  removal First menstrual cycle after Nexplanon  removal occurred on June 17-18, 2025, with normal duration of one week. Negative pregnancy test confirms no early pregnancy.        Follow up plan: Return if symptoms worsen or fail to  improve.

## 2023-11-18 LAB — URINE CULTURE
MICRO NUMBER:: 16629975
SPECIMEN QUALITY:: ADEQUATE

## 2023-11-20 ENCOUNTER — Ambulatory Visit: Payer: Self-pay | Admitting: Nurse Practitioner

## 2023-11-20 DIAGNOSIS — N3000 Acute cystitis without hematuria: Secondary | ICD-10-CM

## 2023-11-20 MED ORDER — CEPHALEXIN 500 MG PO CAPS: 500.0000 mg | ORAL_CAPSULE | Freq: Two times a day (BID) | ORAL | 0 refills | Status: AC

## 2023-11-21 LAB — CERVICOVAGINAL ANCILLARY ONLY
Bacterial Vaginitis (gardnerella): NEGATIVE
Candida Glabrata: NEGATIVE
Candida Vaginitis: NEGATIVE
Chlamydia: NEGATIVE
Comment: NEGATIVE
Comment: NEGATIVE
Comment: NEGATIVE
Comment: NEGATIVE
Comment: NEGATIVE
Comment: NORMAL
Neisseria Gonorrhea: NEGATIVE
Trichomonas: NEGATIVE

## 2023-12-02 DIAGNOSIS — Z419 Encounter for procedure for purposes other than remedying health state, unspecified: Secondary | ICD-10-CM | POA: Diagnosis not present

## 2024-01-01 ENCOUNTER — Encounter: Payer: Self-pay | Admitting: Family Medicine

## 2024-01-01 ENCOUNTER — Ambulatory Visit: Admitting: Family Medicine

## 2024-01-01 VITALS — BP 128/72 | HR 88 | Resp 16 | Ht 65.0 in | Wt 268.0 lb

## 2024-01-01 DIAGNOSIS — R7301 Impaired fasting glucose: Secondary | ICD-10-CM | POA: Diagnosis not present

## 2024-01-01 DIAGNOSIS — E559 Vitamin D deficiency, unspecified: Secondary | ICD-10-CM

## 2024-01-01 DIAGNOSIS — Z0000 Encounter for general adult medical examination without abnormal findings: Secondary | ICD-10-CM | POA: Diagnosis not present

## 2024-01-01 DIAGNOSIS — Z111 Encounter for screening for respiratory tuberculosis: Secondary | ICD-10-CM | POA: Diagnosis not present

## 2024-01-01 DIAGNOSIS — Z6841 Body Mass Index (BMI) 40.0 and over, adult: Secondary | ICD-10-CM | POA: Diagnosis not present

## 2024-01-01 NOTE — Progress Notes (Unsigned)
 Patient: Sherry Spence, Female    DOB: 1990/12/10, 33 y.o.   MRN: [id number] Leavy Mole, PA-C Visit Date: 01/01/2024  Today's Provider: Mole Leavy, PA-C   Chief Complaint  Patient presents with   Annual Exam   Subjective:   Annual physical exam:  Sherry Spence is a 33 y.o. female who presents today for complete physical exam:  Exercise/Activity:  more active and working on exercise/lifestyle being healthier  Diet/nutrition:  cut out sodas, lost weight Sleep:  no concerns  Discussed the use of AI scribe software for clinical note transcription with the patient, who gave verbal consent to proceed.  History of Present Illness       USPSTF grade A and B recommendations - reviewed and addressed today  Depression:  Phq 9 completed today by patient, was reviewed by me with patient in the room PHQ score is neg, pt feels mood are overall good - more regulated getting off of nexplanon     01/01/2024    2:04 PM 02/15/2023   11:10 AM 05/30/2022    2:44 PM 05/13/2021    8:39 AM  PHQ 2/9 Scores  PHQ - 2 Score 0 0 0 0  PHQ- 9 Score   0       01/01/2024    2:04 PM 02/15/2023   11:10 AM 05/30/2022    2:44 PM 05/13/2021    8:39 AM 01/06/2021    1:14 PM  Depression screen PHQ 2/9  Decreased Interest 0 0 0 0 0  Down, Depressed, Hopeless 0 0 0 0 0  PHQ - 2 Score 0 0 0 0 0  Altered sleeping   0    Tired, decreased energy   0    Change in appetite   0    Feeling bad or failure about yourself    0    Trouble concentrating   0    Moving slowly or fidgety/restless   0    Suicidal thoughts   0    PHQ-9 Score   0    Difficult doing work/chores   Not difficult at all      Alcohol screening: Flowsheet Row Office Visit from 01/01/2024 in [facility]  AUDIT-C Score 0    Immunizations and Health Maintenance: Health Maintenance  Topic Date Due   Cervical Cancer Screening (HPV/Pap Cotest)  11/29/2019   COVID-19 Vaccine (3 - Pfizer risk series)  01/17/2024 (Originally 06/26/2020)   INFLUENZA VACCINE  08/20/2024 (Originally 12/22/2023)   Hepatitis B Vaccines (1 of 3 - 19+ 3-dose series) 12/31/2024 (Originally 09/26/2007)   HPV VACCINES (1 - Risk 3-dose SCDM series) 12/31/2024 (Originally 09/26/2015)   DTaP/Tdap/Td (8 - Td or Tdap) 09/13/2031   Hepatitis C Screening  Completed   HIV Screening  Completed   Meningococcal B Vaccine  Aged Out     Hep C Screening: done  STD testing and prevention (HIV/chl/gon/syphilis):  see above, no additional testing desired by pt today  Intimate partner violence:  safe   Sexual History/Pain during Intercourse: Married  Menstrual History/LMP/Abnormal Bleeding: nexplanon  out more normal cycles 5-7 d  Patient's last menstrual period was 12/25/2023.  Incontinence Symptoms:  none  Breast cancer:  Last Mammogram: *see HM list above BRCA gene screening: ***  Cervical cancer screening:  doing with OBGYN  Pt *** family hx of cancers - breast, ovarian, uterine, colon:     Osteoporosis:   Discussion on osteoporosis per age, including high calcium and vitamin D  supplementation,  weight bearing exercises   Skin cancer:  Hx of skin CA -  NO   Discussed atypical lesions   Colorectal cancer:   Colonoscopy is ***   Discussed concerning signs and sx of CRC, pt denies ***  Lung cancer:   Low Dose CT Chest recommended if Age 70-80 years, 20 pack-year currently smoking OR have quit w/in 15years. Patient {DOES NOT does:27190::does not} qualify.    Social History   Tobacco Use   Smoking status: Never   Smokeless tobacco: Never  Vaping Use   Vaping status: Never Used  Substance Use Topics   Alcohol use: No   Drug use: No     Flowsheet Row Office Visit from 01/01/2024 in [facility]  AUDIT-C Score 0    Family History  Problem Relation Age of Onset   Hypertension Father    Diabetes Paternal Aunt    Cancer Maternal Grandmother    Diabetes Maternal Grandmother     Dementia Maternal Grandmother    Diabetes Paternal Grandmother    Hypertension Paternal Grandmother    Asthma Mother    Asthma Sister    Cancer Paternal Grandfather    Crohn's disease Sister    Asthma Sister    Mental retardation Neg Hx      Blood pressure/Hypertension: BP Readings from Last 3 Encounters:  01/01/24 128/72  11/16/23 116/72  10/10/23 132/84    Weight/Obesity: Wt Readings from Last 3 Encounters:  01/01/24 268 lb (121.6 kg)  11/16/23 271 lb 9.6 oz (123.2 kg)  10/10/23 277 lb 6.4 oz (125.8 kg)   BMI Readings from Last 3 Encounters:  01/01/24 44.60 kg/m  11/16/23 45.20 kg/m  10/10/23 46.16 kg/m     Lipids:  Lab Results  Component Value Date   CHOL 133 01/15/2019   Lab Results  Component Value Date   HDL 37 (L) 01/15/2019   Lab Results  Component Value Date   LDLCALC 82 01/15/2019   Lab Results  Component Value Date   TRIG 67 01/15/2019   Lab Results  Component Value Date   CHOLHDL 3.6 01/15/2019   No results found for: LDLDIRECT Based on the results of lipid panel his/her cardiovascular risk factor ( using [place] Cohort )  in the next 10 years is: The ASCVD Risk score (Arnett DK, et al., 2019) failed to calculate for the following reasons:   The 2019 ASCVD risk score is only valid for ages 2 to 55  Glucose:  Glucose, Bld  Date Value Ref Range Status  08/15/2023 104 (H) 65 - 99 mg/dL Final    Comment:    .            Fasting reference interval . For someone without known diabetes, a glucose value between 100 and 125 mg/dL is consistent with prediabetes and should be confirmed with a follow-up test. .   05/30/2022 78 65 - 99 mg/dL Final    Comment:    .            Fasting reference interval .   03/23/2019 118 (H) 70 - 99 mg/dL Final    Advanced Care Planning:  A voluntary discussion about advance care planning including the explanation and discussion of advance directives.   Discussed health care proxy and Living will, and  the patient was able to identify a health care proxy as ***.   Patient {DOES_DOES WNU:81435} have a living will at present time.   Social History  Social History   Socioeconomic History   Marital status: Married    Spouse name: Not on file   Number of children: 3   Years of education: 12   Highest education level: Associate degree: occupational, Scientist, product/process development, or vocational program  Occupational History   Not on file  Tobacco Use   Smoking status: Never   Smokeless tobacco: Never  Vaping Use   Vaping status: Never Used  Substance and Sexual Activity   Alcohol use: No   Drug use: No   Sexual activity: Yes    Birth control/protection: Other-see comments    Comment: Husband had vasectomy  Other Topics Concern   Not on file  Social History Narrative   Getting Married Sept 20th (? Or close to that) 2020 with fiance of several years   Three children ages 52, 2 and 4   [place] reign lathan   Elyn reign lathan   Luke Dupree lathan   Social Drivers of Health   Financial Resource Strain: Low Risk  (01/01/2024)   Overall Financial Resource Strain (CARDIA)    Difficulty of Paying Living Expenses: Not hard at all  Food Insecurity: No Food Insecurity (01/01/2024)   Hunger Vital Sign    Worried About Running Out of Food in the Last Year: Never true    Ran Out of Food in the Last Year: Never true  Transportation Needs: No Transportation Needs (01/01/2024)   PRAPARE - Administrator, Civil Service (Medical): No    Lack of Transportation (Non-Medical): No  Physical Activity: Insufficiently Active (01/01/2024)   Exercise Vital Sign    Days of Exercise per Week: 2 days    Minutes of Exercise per Session: 30 min  Stress: No Stress Concern Present (01/01/2024)   [employer] of Occupational Health - Occupational Stress Questionnaire    Feeling of Stress: Only a little  Social Connections: Moderately Integrated (01/01/2024)   Social Connection and Isolation Panel    Frequency  of Communication with Friends and Family: More than three times a week    Frequency of Social Gatherings with Friends and Family: More than three times a week    Attends Religious Services: More than 4 times per year    Active Member of [employer] or Organizations: No    Attends Engineer, structural: Never    Marital Status: Married    Family History        Family History  Problem Relation Age of Onset   Hypertension Father    Diabetes Paternal Aunt    Cancer Maternal Grandmother    Diabetes Maternal Grandmother    Dementia Maternal Grandmother    Diabetes Paternal Grandmother    Hypertension Paternal Grandmother    Asthma Mother    Asthma Sister    Cancer Paternal Grandfather    Crohn's disease Sister    Asthma Sister    Mental retardation Neg Hx     Patient Active Problem List   Diagnosis Date Noted   Microcytic anemia 01/14/2019   Seasonal allergic rhinitis 01/14/2019   Morbid obesity with body mass index (BMI) of 45.0 to 49.9 in adult (HCC) 03/18/2016   Class 3 severe obesity with body mass index (BMI) of 45.0 to 49.9 in adult 03/18/2016   H/O chlamydia infection 02/28/2014   Gastroesophageal reflux disease without esophagitis 01/20/2014    Past Surgical History:  Procedure Laterality Date   NO PAST SURGERIES      No current outpatient medications on file.  Allergies  Allergen Reactions   Macrobid  [Nitrofurantoin ] Nausea Only    And dizziness   Compazine [Prochlorperazine Edisylate] Anxiety    Patient Care Team: Crystel Demarco, PA-C as PCP - General (Family Medicine)   Chart Review: I personally reviewed active problem list, medication list, allergies, family history, social history, health maintenance, notes from last encounter, lab results, imaging with the patient/caregiver today.   Review of Systems        Objective:   Vitals:  Vitals:   01/01/24 1404  BP: 128/72  Pulse: 88  Resp: 16  SpO2: 99%  Weight: 268 lb (121.6 kg)  Height: 5'  5 (1.651 m)    Body mass index is 44.6 kg/m.  Physical Exam    Fall Risk:    01/01/2024    2:04 PM 02/15/2023   11:10 AM 05/30/2022    2:44 PM 05/13/2021    8:39 AM 01/06/2021    1:13 PM  Fall Risk   Falls in the past year? 0 0 0 0 0  Number falls in past yr: 0 0 0 0 0  Injury with Fall? 0 0 0 0 0  Risk for fall due to : No Fall Risks No Fall Risks No Fall Risks    Follow up Falls prevention discussed Falls prevention discussed Falls prevention discussed;Falls evaluation completed;Education provided  Falls evaluation completed  Falls evaluation completed      Data saved with a previous flowsheet row definition    Functional Status Survey: Is the patient deaf or have difficulty hearing?: No Does the patient have difficulty seeing, even when wearing glasses/contacts?: No Does the patient have difficulty concentrating, remembering, or making decisions?: No Does the patient have difficulty walking or climbing stairs?: No Does the patient have difficulty dressing or bathing?: No Does the patient have difficulty doing errands alone such as visiting a doctor's office or shopping?: No   Assessment & Plan:    CPE completed today  USPSTF grade A and B recommendations reviewed with patient; age-appropriate recommendations, preventive care, screening tests, etc discussed and encouraged; healthy living encouraged; see AVS for patient education given to patient  Discussed importance of 150 minutes of physical activity weekly, AHA exercise recommendations given to pt in AVS/handout  Discussed importance of healthy diet:  eating lean meats and proteins, avoiding trans fats and saturated fats, avoid simple sugars and excessive carbs in diet, eat 6 servings of fruit/vegetables daily and drink plenty of water and avoid sweet beverages.    Recommended pt to do annual eye exam and routine dental exams/cleanings  Depression, alcohol, fall screening completed as documented above and per  flowsheets  Advance Care planning information and packet discussed and offered today, encouraged pt to discuss with family members/spouse/partner/friends and complete Advanced directive packet and bring copy to office   Reviewed Health Maintenance: Health Maintenance  Topic Date Due   Cervical Cancer Screening (HPV/Pap Cotest)  11/29/2019   COVID-19 Vaccine (3 - Pfizer risk series) 01/17/2024 (Originally 06/26/2020)   INFLUENZA VACCINE  08/20/2024 (Originally 12/22/2023)   Hepatitis B Vaccines (1 of 3 - 19+ 3-dose series) 12/31/2024 (Originally 09/26/2007)   HPV VACCINES (1 - Risk 3-dose SCDM series) 12/31/2024 (Originally 09/26/2015)   DTaP/Tdap/Td (8 - Td or Tdap) 09/13/2031   Hepatitis C Screening  Completed   HIV Screening  Completed   Meningococcal B Vaccine  Aged Out    Immunizations: Immunization History  Administered Date(s) Administered   DTP 01/26/1989, 05/21/1990, 12/15/1992   Dtap, Unspecified 05/19/1989   HIB,  Unspecified 05/19/1989, 05/21/1990   Hep B, Unspecified 02/25/2000, 03/31/2000, 09/29/2000   Influenza-Unspecified 04/05/2016   MMR 05/21/1990, 12/15/1992   Meningococcal Conjugate 12/27/2006   OPV 01/26/1989, 05/21/1990, 12/15/1992   PFIZER(Purple Top)SARS-COV-2 Vaccination 05/08/2020, 05/29/2020   PPD Test 12/22/2013, 07/22/2015   Polio, Unspecified 05/19/1989   Tdap 12/27/2006, 03/18/2016, 09/12/2021   Varicella 12/27/2006   Vaccines:  HPV: up to at age 63 , ask insurance if age between 65-45  Shingrix: 54-64 yo and ask insurance if covered when patient above 26 yo Pneumonia: *** educated and discussed with patient. Flu: *** educated and discussed with patient. COVID:      ICD-10-CM   1. Well adult exam  Z00.00     2. Morbid obesity with body mass index (BMI) of 45.0 to 49.9 in adult (HCC)  E66.01    Z68.42     3. Vitamin D  deficiency  E55.9     4. Tuberculosis screening  Z11.1     5. Elevated fasting glucose  R73.01           Michelene Cower,  PA-C 01/01/24 2:46 PM  [facility]

## 2024-01-02 DIAGNOSIS — Z419 Encounter for procedure for purposes other than remedying health state, unspecified: Secondary | ICD-10-CM | POA: Diagnosis not present

## 2024-01-04 ENCOUNTER — Ambulatory Visit: Payer: Self-pay | Admitting: Family Medicine

## 2024-01-04 LAB — LIPID PANEL
Cholesterol: 139 mg/dL (ref <200)
HDL: 37 mg/dL — ABNORMAL LOW (ref >=50)
LDL Cholesterol (Calc): 84 mg/dL
Non-HDL Cholesterol (Calc): 102 mg/dL (ref <130)
Total CHOL/HDL Ratio: 3.8 (calc) (ref <5.0)
Triglycerides: 90 mg/dL (ref <150)

## 2024-01-04 LAB — CBC WITH DIFFERENTIAL/PLATELET
Absolute Lymphocytes: 1431 {cells}/uL (ref 850–3900)
Absolute Monocytes: 260 {cells}/uL (ref 200–950)
Basophils Absolute: 10 {cells}/uL (ref 0–200)
Basophils Relative: 0.2 %
Eosinophils Absolute: 632 {cells}/uL — ABNORMAL HIGH (ref 15–500)
Eosinophils Relative: 12.9 %
HCT: 32.9 % — ABNORMAL LOW (ref 35.0–45.0)
Hemoglobin: 10 g/dL — ABNORMAL LOW (ref 11.7–15.5)
MCH: 24.2 pg — ABNORMAL LOW (ref 27.0–33.0)
MCHC: 30.4 g/dL — ABNORMAL LOW (ref 32.0–36.0)
MCV: 79.5 fL — ABNORMAL LOW (ref 80.0–100.0)
MPV: 10.2 fL (ref 7.5–12.5)
Monocytes Relative: 5.3 %
Neutro Abs: 2568 {cells}/uL (ref 1500–7800)
Neutrophils Relative %: 52.4 %
Platelets: 512 Thousand/uL — ABNORMAL HIGH (ref 140–400)
RBC: 4.14 Million/uL (ref 3.80–5.10)
RDW: 14.4 % (ref 11.0–15.0)
Total Lymphocyte: 29.2 %
WBC: 4.9 Thousand/uL (ref 3.8–10.8)

## 2024-01-04 LAB — COMPREHENSIVE METABOLIC PANEL WITH GFR
AG Ratio: 1.2 (calc) (ref 1.0–2.5)
ALT: 13 U/L (ref 6–29)
AST: 16 U/L (ref 10–30)
Albumin: 3.7 g/dL (ref 3.6–5.1)
Alkaline phosphatase (APISO): 87 U/L (ref 31–125)
BUN: 12 mg/dL (ref 7–25)
CO2: 25 mmol/L (ref 20–32)
Calcium: 8.5 mg/dL — ABNORMAL LOW (ref 8.6–10.2)
Chloride: 108 mmol/L (ref 98–110)
Creat: 0.81 mg/dL (ref 0.50–0.97)
Globulin: 3.1 g/dL (ref 1.9–3.7)
Glucose, Bld: 72 mg/dL (ref 65–99)
Potassium: 4.2 mmol/L (ref 3.5–5.3)
Sodium: 140 mmol/L (ref 135–146)
Total Bilirubin: 0.3 mg/dL (ref 0.2–1.2)
Total Protein: 6.8 g/dL (ref 6.1–8.1)
eGFR: 97 mL/min/1.73m2 (ref >=60)

## 2024-01-04 LAB — QUANTIFERON-TB GOLD PLUS
Mitogen-NIL: >10 [IU]/mL
NIL: 0.01 [IU]/mL
QuantiFERON-TB Gold Plus: NEGATIVE
TB1-NIL: 0 [IU]/mL
TB2-NIL: 0 [IU]/mL

## 2024-01-04 LAB — VITAMIN D 25 HYDROXY (VIT D DEFICIENCY, FRACTURES): Vit D, 25-Hydroxy: 29 ng/mL — ABNORMAL LOW (ref 30–100)

## 2024-01-04 LAB — HEMOGLOBIN A1C
Hgb A1c MFr Bld: 5.6 % (ref <5.7)
Mean Plasma Glucose: 114 mg/dL
eAG (mmol/L): 6.3 mmol/L

## 2024-01-04 LAB — TSH: TSH: 0.58 m[IU]/L

## 2024-01-09 ENCOUNTER — Telehealth: Payer: Self-pay

## 2024-01-09 NOTE — Telephone Encounter (Signed)
 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 Please advise pt if she needs to provide any other information. Pt will fax or upload a form as well to be filled out.

## 2024-01-09 NOTE — Telephone Encounter (Signed)
 Once form is received, we will fill out and send back

## 2024-01-16 NOTE — Progress Notes (Unsigned)
 Office Visit Note  Patient: Sherry Spence             Date of Birth: April 21, 1991           MRN: [id number]             PCP: Leavy Mole, PA-C Referring: Leavy Mole, PA-C Visit Date: 01/17/2024 Occupation: @GUAROCC @  Subjective:  No chief complaint on file.   History of Present Illness: Sherry Spence is a 33 y.o. female ***     Activities of Daily Living:  Patient reports morning stiffness for *** {minute/hour:19697}.   Patient {ACTIONS;DENIES/REPORTS:21021675::Denies} nocturnal pain.  Difficulty dressing/grooming: {ACTIONS;DENIES/REPORTS:21021675::Denies} Difficulty climbing stairs: {ACTIONS;DENIES/REPORTS:21021675::Denies} Difficulty getting out of chair: {ACTIONS;DENIES/REPORTS:21021675::Denies} Difficulty using hands for taps, buttons, cutlery, and/or writing: {ACTIONS;DENIES/REPORTS:21021675::Denies}  No Rheumatology ROS completed.   PMFS History:  Patient Active Problem List   Diagnosis Date Noted   Microcytic anemia 01/14/2019   Seasonal allergic rhinitis 01/14/2019   Morbid obesity with body mass index (BMI) of 45.0 to 49.9 in adult (HCC) 03/18/2016   Class 3 severe obesity with body mass index (BMI) of 45.0 to 49.9 in adult 03/18/2016   H/O chlamydia infection 02/28/2014   Gastroesophageal reflux disease without esophagitis 01/20/2014    Past Medical History:  Diagnosis Date   Allergy    Anxiety    BMI 40.0-44.9, adult (HCC) 03/18/2016   Cholelithiasis    GERD (gastroesophageal reflux disease) 01/20/2014   Headache    History of chlamydia infection     Family History  Problem Relation Age of Onset   Hypertension Father    Diabetes Paternal Aunt    Cancer Maternal Grandmother    Diabetes Maternal Grandmother    Dementia Maternal Grandmother    Diabetes Paternal Grandmother    Hypertension Paternal Grandmother    Asthma Mother    Asthma Sister    Cancer Paternal Grandfather    Crohn's disease Sister    Asthma Sister    Mental  retardation Neg Hx    Past Surgical History:  Procedure Laterality Date   NO PAST SURGERIES     Social History   Social History Narrative   Getting Married Sept 20th (? Or close to that) 2020 with fiance of several years   Three children ages 41, 2 and 4   [place] reign Spence   Sherry Spence   Sherry Spence   Immunization History  Administered Date(s) Administered   DTP 01/26/1989, 05/21/1990, 12/15/1992   Dtap, Unspecified 05/19/1989   HIB, Unspecified 05/19/1989, 05/21/1990   Hep B, Unspecified 02/25/2000, 03/31/2000, 09/29/2000   Influenza-Unspecified 04/05/2016   MMR 05/21/1990, 12/15/1992   Meningococcal Conjugate 12/27/2006   OPV 01/26/1989, 05/21/1990, 12/15/1992   PFIZER(Purple Top)SARS-COV-2 Vaccination 05/08/2020, 05/29/2020   PPD Test 12/22/2013, 07/22/2015   Polio, Unspecified 05/19/1989   Tdap 12/27/2006, 03/18/2016, 09/12/2021   Varicella 12/27/2006     Objective: Vital Signs: LMP 12/25/2023    Physical Exam   Musculoskeletal Exam: ***  CDAI Exam: CDAI Score: -- Patient Global: --; Provider Global: -- Swollen: --; Tender: -- Joint Exam 01/17/2024   No joint exam has been documented for this visit   There is currently no information documented on the homunculus. Go to the Rheumatology activity and complete the homunculus joint exam.  Investigation: No additional findings.  Imaging: No results found.  Recent Labs: Lab Results  Component Value Date   WBC 4.9 01/01/2024   HGB 10.0 (L) 01/01/2024   PLT 512 (H) 01/01/2024   NA  140 01/01/2024   K 4.2 01/01/2024   CL 108 01/01/2024   CO2 25 01/01/2024   GLUCOSE 72 01/01/2024   BUN 12 01/01/2024   CREATININE 0.81 01/01/2024   BILITOT 0.3 01/01/2024   ALKPHOS 104 03/23/2019   AST 16 01/01/2024   ALT 13 01/01/2024   PROT 6.8 01/01/2024   ALBUMIN 3.4 (L) 03/23/2019   CALCIUM 8.5 (L) 01/01/2024   GFRAA >60 03/23/2019   QFTBGOLDPLUS NEGATIVE 01/01/2024    Speciality Comments: No  specialty comments available.  Procedures:  No procedures performed Allergies: Macrobid  [nitrofurantoin ] and Compazine [prochlorperazine edisylate]   Assessment / Plan:     Visit Diagnoses: No diagnosis found.  Orders: No orders of the defined types were placed in this encounter.  No orders of the defined types were placed in this encounter.   Face-to-face time spent with patient was *** minutes. Greater than 50% of time was spent in counseling and coordination of care.  Follow-Up Instructions: No follow-ups on file.   Lonni LELON Ester, MD  Note - This record has been created using [employer].  Chart creation errors have been sought, but may not always  have been located. Such creation errors do not reflect on  the standard of medical care.

## 2024-01-17 ENCOUNTER — Ambulatory Visit: Attending: Internal Medicine | Admitting: Internal Medicine

## 2024-01-17 ENCOUNTER — Encounter: Payer: Self-pay | Admitting: Internal Medicine

## 2024-01-17 VITALS — BP 125/81 | HR 73 | Resp 16 | Ht 65.5 in | Wt 269.8 lb

## 2024-01-17 DIAGNOSIS — M722 Plantar fascial fibromatosis: Secondary | ICD-10-CM | POA: Diagnosis not present

## 2024-01-17 DIAGNOSIS — R768 Other specified abnormal immunological findings in serum: Secondary | ICD-10-CM | POA: Insufficient documentation

## 2024-01-20 LAB — RNP ANTIBODY: Ribonucleic Protein(ENA) Antibody, IgG: <1 AI

## 2024-01-20 LAB — ANA: Anti Nuclear Antibody (ANA): POSITIVE — AB

## 2024-01-20 LAB — C3 AND C4
C3 Complement: 144 mg/dL (ref 83–193)
C4 Complement: 28 mg/dL (ref 15–57)

## 2024-01-20 LAB — CYCLIC CITRUL PEPTIDE ANTIBODY, IGG: Cyclic Citrullin Peptide Ab: >250 U — ABNORMAL HIGH

## 2024-01-20 LAB — ANTI-NUCLEAR AB-TITER (ANA TITER)
ANA TITER: 1:40 {titer} — ABNORMAL HIGH
ANA Titer 1: 1:80 {titer} — ABNORMAL HIGH

## 2024-01-20 LAB — SEDIMENTATION RATE: Sed Rate: 29 mm/h — ABNORMAL HIGH (ref 0–20)

## 2024-01-20 LAB — SJOGRENS SYNDROME-B EXTRACTABLE NUCLEAR ANTIBODY: SSB (La) (ENA) Antibody, IgG: <1 AI

## 2024-01-20 LAB — ANTI-DNA ANTIBODY, DOUBLE-STRANDED: ds DNA Ab: <1 [IU]/mL

## 2024-01-20 LAB — ANTI-SMITH ANTIBODY: ENA SM Ab Ser-aCnc: <1 AI

## 2024-01-20 LAB — SJOGRENS SYNDROME-A EXTRACTABLE NUCLEAR ANTIBODY: SSA (Ro) (ENA) Antibody, IgG: <1 AI

## 2024-02-02 DIAGNOSIS — Z419 Encounter for procedure for purposes other than remedying health state, unspecified: Secondary | ICD-10-CM | POA: Diagnosis not present

## 2024-02-07 ENCOUNTER — Encounter: Payer: Self-pay | Admitting: Family Medicine

## 2024-02-07 ENCOUNTER — Other Ambulatory Visit: Payer: Self-pay | Admitting: Family Medicine

## 2024-02-07 DIAGNOSIS — D649 Anemia, unspecified: Secondary | ICD-10-CM

## 2024-02-07 NOTE — Progress Notes (Unsigned)
 Patient ID: Sherry Spence, female    DOB: 1990/08/24, 33 y.o.   MRN: [id number]  PCP: Leavy Mole, PA-C  No chief complaint on file.   Subjective:   TAWNIE EHRESMAN is a 33 y.o. female, presents to clinic with CC of the following:  HPI    Patient Active Problem List   Diagnosis Date Noted   Positive ANA (antinuclear antibody) 01/17/2024   Rheumatoid factor positive 01/17/2024   Plantar fasciitis of right foot 01/17/2024   Microcytic anemia 01/14/2019   Seasonal allergic rhinitis 01/14/2019   Morbid obesity with body mass index (BMI) of 45.0 to 49.9 in adult [facility]) 03/18/2016   Class 3 severe obesity with body mass index (BMI) of 45.0 to 49.9 in adult 03/18/2016   H/O chlamydia infection 02/28/2014   Gastroesophageal reflux disease without esophagitis 01/20/2014     No current outpatient medications on file.   Allergies  Allergen Reactions   Macrobid  [Nitrofurantoin ] Nausea Only    And dizziness   Compazine [Prochlorperazine Edisylate] Anxiety     Social History   Tobacco Use   Smoking status: Never    Passive exposure: Never   Smokeless tobacco: Never  Vaping Use   Vaping status: Never Used  Substance Use Topics   Alcohol use: No   Drug use: No      Chart Review Today: ***  Review of Systems     Objective:   There were no vitals filed for this visit.  There is no height or weight on file to calculate BMI.  Physical Exam   Results for orders placed or performed in visit on 01/17/24  Sedimentation rate   Collection Time: 01/17/24  8:58 AM  Result Value Ref Range   Sed Rate 29 (H) 0 - 20 mm/h  Cyclic citrul peptide antibody, IgG   Collection Time: 01/17/24  8:58 AM  Result Value Ref Range   Cyclic Citrullin Peptide Ab >749 (H) UNITS  ANA   Collection Time: 01/17/24  8:58 AM  Result Value Ref Range   Anti Nuclear Antibody (ANA) POSITIVE (A) NEGATIVE  RNP Antibody   Collection Time: 01/17/24  8:58 AM  Result Value Ref Range    Ribonucleic Protein(ENA) Antibody, IgG <1.0 NEG <1.0 NEG AI  Anti-Smith antibody   Collection Time: 01/17/24  8:58 AM  Result Value Ref Range   ENA SM Ab Ser-aCnc <1.0 NEG <1.0 NEG AI  Sjogrens syndrome-A extractable nuclear antibody   Collection Time: 01/17/24  8:58 AM  Result Value Ref Range   SSA (Ro) (ENA) Antibody, IgG <1.0 NEG <1.0 NEG AI  Sjogrens syndrome-B extractable nuclear antibody   Collection Time: 01/17/24  8:58 AM  Result Value Ref Range   SSB (La) (ENA) Antibody, IgG <1.0 NEG <1.0 NEG AI  Anti-DNA antibody, double-stranded   Collection Time: 01/17/24  8:58 AM  Result Value Ref Range   ds DNA Ab <1 IU/mL  C3 and C4   Collection Time: 01/17/24  8:58 AM  Result Value Ref Range   C3 Complement 144 83 - 193 mg/dL   C4 Complement 28 15 - 57 mg/dL  Anti-nuclear ab-titer (ANA titer)   Collection Time: 01/17/24  8:58 AM  Result Value Ref Range   ANA Titer 1 1:80 (H) titer   ANA Pattern 1 Nuclear, Dense Fine Speckled (A)    ANA TITER 1:40 (H) titer   ANA PATTERN Cytoplasmic (A)        Assessment & Plan:   ***  Michelene Cower, PA-C 02/07/24 7:20 PM

## 2024-02-07 NOTE — Progress Notes (Signed)
 Hx of anemia, multiple sx and concerns, pt coming into clinic tomorrow late am Labs ordered today for her to go to hospital outpt labs to get stat and prior to appt to better help her    ICD-10-CM   1. Anemia, unspecified type  D64.9 CBC with Differential/Platelet    Technologist smear review    Ferritin    Iron and TIBC     Reviewed recent rheumatology OV and labs as well - unclear what the dx or plan is, OV 8/27  Michelene Cower, PA-C 02/07/24 7:18 PM

## 2024-02-08 ENCOUNTER — Ambulatory Visit (INDEPENDENT_AMBULATORY_CARE_PROVIDER_SITE_OTHER): Admitting: Family Medicine

## 2024-02-08 ENCOUNTER — Encounter: Payer: Self-pay | Admitting: Family Medicine

## 2024-02-08 VITALS — BP 122/70 | HR 86 | Temp 98.7°F | Resp 16 | Ht 65.0 in | Wt 264.0 lb

## 2024-02-08 DIAGNOSIS — R5383 Other fatigue: Secondary | ICD-10-CM | POA: Diagnosis not present

## 2024-02-08 DIAGNOSIS — M542 Cervicalgia: Secondary | ICD-10-CM

## 2024-02-08 DIAGNOSIS — R519 Headache, unspecified: Secondary | ICD-10-CM

## 2024-02-08 DIAGNOSIS — J329 Chronic sinusitis, unspecified: Secondary | ICD-10-CM

## 2024-02-08 DIAGNOSIS — D649 Anemia, unspecified: Secondary | ICD-10-CM | POA: Diagnosis not present

## 2024-02-08 DIAGNOSIS — M62838 Other muscle spasm: Secondary | ICD-10-CM | POA: Diagnosis not present

## 2024-02-08 LAB — POC COVID19/FLU A&B COMBO
Covid Antigen, POC: NEGATIVE
Influenza A Antigen, POC: NEGATIVE
Influenza B Antigen, POC: NEGATIVE

## 2024-02-08 MED ORDER — TIZANIDINE HCL 4 MG PO TABS: 2.0000 mg | ORAL_TABLET | Freq: Three times a day (TID) | ORAL | 2 refills | Status: AC | PRN

## 2024-02-08 MED ORDER — LEVOCETIRIZINE DIHYDROCHLORIDE 5 MG PO TABS: 5.0000 mg | ORAL_TABLET | Freq: Every evening | ORAL | 1 refills | Status: AC

## 2024-02-08 MED ORDER — METHYLPREDNISOLONE 4 MG PO TBPK: ORAL_TABLET | ORAL | 0 refills | Status: AC

## 2024-02-08 MED ORDER — FLUTICASONE PROPIONATE 50 MCG/ACT NA SUSP: 2.0000 | Freq: Every day | NASAL | 6 refills | Status: AC

## 2024-02-08 NOTE — Patient Instructions (Signed)
 For headaches you can try tylenol , NSAIDs or excedrine migraine - make sure to follow instructions on bottle or box  For sinus/allergies recommend doing a daily antihistamine and steroid nose spray.  If you have pressure you can also try sudafed  Start the steroids tomorrow - they may help several symptoms  Muscle tension - PT referral, heat therapy, NSAIDs, gentle stretching and muscle relaxer

## 2024-02-09 ENCOUNTER — Other Ambulatory Visit: Payer: Self-pay | Admitting: Family Medicine

## 2024-02-09 DIAGNOSIS — E559 Vitamin D deficiency, unspecified: Secondary | ICD-10-CM

## 2024-02-09 NOTE — Telephone Encounter (Signed)
 Requested medication (s) are due for refill today: -  Requested medication (s) are on the active medication list: no  Last refill:  08/22/23   Future visit scheduled: yes  Notes to clinic:  med dc'd 01/01/24 pt. Preference by PCP//NT not delegated to refuse this type of med   Requested Prescriptions  Pending Prescriptions Disp Refills   Vitamin D , Ergocalciferol , (DRISDOL ) 1.25 MG (50000 UNIT) CAPS capsule [Pharmacy Med Name: VITAMIN D2 1.25MG (50,000 UNIT)] 12 capsule 1    Sig: Take 1 capsule (50,000 Units total) by mouth every 7 (seven) days. x12 weeks.     Endocrinology:  Vitamins - Vitamin D  Supplementation 2 Failed - 02/09/2024  2:07 PM      Failed - Manual Review: Route requests for 50,000 IU strength to the provider      Failed - Ca in normal range and within 360 days    Calcium  Date Value Ref Range Status  01/01/2024 8.5 (L) 8.6 - 10.2 mg/dL Final         Failed - Vitamin D  in normal range and within 360 days    Vit D, 25-Hydroxy  Date Value Ref Range Status  01/01/2024 29 (L) 30 - 100 ng/mL Final    Comment:    Vitamin D  Status         25-OH Vitamin D : . Deficiency:                    <20 ng/mL Insufficiency:             20 - 29 ng/mL Optimal:                 > or = 30 ng/mL . For 25-OH Vitamin D  testing on patients on  D2-supplementation and patients for whom quantitation  of D2 and D3 fractions is required, the [employer](TM) 25-OH VIT D, (D2,D3), LC/MS/MS is recommended: order  code 07111 (patients >71yrs). . See Note 1 . Note 1 . For additional information, please refer to  http://education.[facility].com/faq/FAQ199  (This link is being provided for informational/ educational purposes only.)          Passed - Valid encounter within last 12 months    Recent Outpatient Visits           Yesterday Anemia, unspecified type   [facility] Leavy Mole, PA-C   1 month ago Well adult exam   [facility] Leavy Mole, PA-C   2 months ago Vaginal discharge   [facility] Gareth Mliss FALCON, FNP   6 months ago Polyarthralgia   [facility] Leavy Mole, PA-C       Future Appointments             In 5 months Rice, Lonni ORN, MD [facility] - A Dept Of [place]   In 10 months Leavy Mole, PA-C [facility] [place]

## 2024-04-11 ENCOUNTER — Ambulatory Visit: Admitting: Internal Medicine

## 2024-07-22 ENCOUNTER — Ambulatory Visit: Admitting: Internal Medicine

## 2025-01-01 ENCOUNTER — Encounter: Admitting: Family Medicine
